# Patient Record
Sex: Female | Born: 1995 | Race: White | Hispanic: No | Marital: Single | Smoking: Former smoker
Health system: Southern US, Community
[De-identification: ages and names within clinical notes are randomized; demographics above are authoritative.]

## PROBLEM LIST (undated history)

## (undated) DIAGNOSIS — E079 Disorder of thyroid, unspecified: Secondary | ICD-10-CM

## (undated) DIAGNOSIS — R000 Tachycardia, unspecified: Secondary | ICD-10-CM

## (undated) DIAGNOSIS — R55 Syncope and collapse: Secondary | ICD-10-CM

## (undated) DIAGNOSIS — F419 Anxiety disorder, unspecified: Secondary | ICD-10-CM

## (undated) HISTORY — DX: Syncope and collapse: R55

## (undated) HISTORY — DX: Disorder of thyroid, unspecified: E07.9

## (undated) HISTORY — DX: Anxiety disorder, unspecified: F41.9

## (undated) HISTORY — DX: Tachycardia, unspecified: R00.0

---
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
## 2018-06-19 ENCOUNTER — Emergency Department (INDEPENDENT_AMBULATORY_CARE_PROVIDER_SITE_OTHER)
Admission: EM | Admit: 2018-06-19 | Discharge: 2018-06-19 | Disposition: A | Discharge status: Home / Self Care | Payer: No Typology Code available for payment source | Source: Home / Self Care | Attending: Internal Medicine | Admitting: Internal Medicine

## 2018-06-19 ENCOUNTER — Encounter: Payer: Self-pay | Admitting: Emergency Medicine

## 2018-06-19 DIAGNOSIS — R590 Localized enlarged lymph nodes: Secondary | ICD-10-CM | POA: Diagnosis not present

## 2018-06-19 NOTE — ED Provider Notes (Signed)
[facility]    CSN: [id number] Arrival date & time: 06/19/18  1106     History   Chief Complaint Chief Complaint  Patient presents with  . Lymphadenopathy    HPI Kristin Orozco is a 22 y.o. female.   22 year old female with no chronic medical problems presents to urgent care complaining of swollen lymph node under her right arm.  She states that has noticed it 4 days ago.  She denies fevers, weight loss or routine night sweats.  She also denies nausea, vomiting, diarrhea, chest pain, shortness of breath or breast pain or nipple discharge.     History reviewed. No pertinent past medical history.  Patient Active Problem List   Diagnosis Date Noted  . Loss of consciousness (HCC) 12/14/2013    History reviewed. No pertinent surgical history.  OB History   No obstetric history on file.      Home Medications    Prior to Admission medications   Medication Sig Start Date End Date Taking? Authorizing Provider  norethindrone-ethinyl estradiol (JUNEL FE,GILDESS FE,LOESTRIN FE) 1-20 MG-MCG tablet Take 1 tablet by mouth daily.   Yes [provider]  sulfamethoxazole-trimethoprim (BACTRIM) 400-80 MG per tablet Take 1 tablet by mouth 2 (two) times daily. 11/26/14   Renford Dills, NP  traMADol (ULTRAM) 50 MG tablet Take 1 tablet (50 mg total) by mouth every 8 (eight) hours as needed (Do not drive or operate machinery while taking as can cause drowsiness.). 11/26/14   Renford Dills, NP    Family History Family History  Problem Relation Age of Onset  . Hypertension Mother   . Lupus Maternal Uncle   . Multiple sclerosis Maternal Grandmother     Social History Social History   Tobacco Use  . Smoking status: Current Every Day Smoker    Packs/day: 0.50    Types: Cigarettes  . Smokeless tobacco: Never Used  Substance Use Topics  . Alcohol use: No  . Drug use: No     Allergies   Patient has no known allergies.   Review of Systems Review  of Systems  Constitutional: Negative for chills and fever.  HENT: Negative for sore throat and tinnitus.   Eyes: Negative for redness.  Respiratory: Negative for cough and shortness of breath.   Cardiovascular: Negative for chest pain and palpitations.  Gastrointestinal: Negative for abdominal pain, diarrhea, nausea and vomiting.  Genitourinary: Negative for dysuria, frequency and urgency.  Musculoskeletal: Negative for myalgias.  Skin: Negative for rash.       No lesions  Neurological: Negative for weakness.  Hematological: Does not bruise/bleed easily.  Psychiatric/Behavioral: Negative for suicidal ideas.     Physical Exam Triage Vital Signs ED Triage Vitals  Enc Vitals Group     BP 06/19/18 1127 (!) 144/83     Pulse Rate 06/19/18 1127 68     Resp --      Temp 06/19/18 1127 98 F (36.7 C)     Temp Source 06/19/18 1127 Oral     SpO2 06/19/18 1127 97 %     Weight 06/19/18 1128 150 lb (68 kg)     Height --      Head Circumference --      Peak Flow --      Pain Score 06/19/18 1128 0     Pain Loc --      Pain Edu? --      Excl. in GC? --    No data found.  Updated Vital Signs BP Marland Kitchen)  144/83 (BP Location: Right Arm)   Pulse 68   Temp 98 F (36.7 C) (Oral)   Wt 68 kg   SpO2 97%   BMI 24.96 kg/m   Visual Acuity Right Eye Distance:   Left Eye Distance:   Bilateral Distance:    Right Eye Near:   Left Eye Near:    Bilateral Near:     Physical Exam Vitals signs and nursing note reviewed.  Constitutional:      General: She is not in acute distress.    Appearance: She is well-developed.  HENT:     Head: Normocephalic and atraumatic.  Eyes:     General: No scleral icterus.    Conjunctiva/sclera: Conjunctivae normal.     Pupils: Pupils are equal, round, and reactive to light.  Neck:     Musculoskeletal: Normal range of motion and neck supple.     Thyroid: No thyromegaly.     Vascular: No JVD.     Trachea: No tracheal deviation.  Cardiovascular:     Rate and  Rhythm: Normal rate and regular rhythm.     Heart sounds: Normal heart sounds. No murmur. No friction rub. No gallop.   Pulmonary:     Effort: Pulmonary effort is normal.     Breath sounds: Normal breath sounds.  Chest:     Comments: Normal breast exam bilaterally Abdominal:     General: Bowel sounds are normal. There is no distension.     Palpations: Abdomen is soft.     Tenderness: There is no abdominal tenderness.  Musculoskeletal: Normal range of motion.  Lymphadenopathy:     Cervical: No cervical adenopathy.     Upper Body:     Right upper body: Axillary adenopathy (Tender) present.     Left upper body: Axillary adenopathy present.  Skin:    General: Skin is warm and dry.  Neurological:     Mental Status: She is alert and oriented to person, place, and time.     Cranial Nerves: No cranial nerve deficit.  Psychiatric:        Behavior: Behavior normal.        Thought Content: Thought content normal.        Judgment: Judgment normal.      UC Treatments / Results  Labs (all labs ordered are listed, but only abnormal results are displayed) Labs Reviewed - No data to display  EKG None  Radiology No results found.  Procedures Procedures (including critical care time)  Medications Ordered in UC Medications - No data to display  Initial Impression / Assessment and Plan / UC Course  I have reviewed the triage vital signs and the nursing notes.  Pertinent labs & imaging results that were available during my care of the patient were reviewed by me and considered in my medical decision making (see chart for details).     Patient has family history of breast cancer in her maternal grandmother.  Her mom is also been having some issues with her breast but largely due to breast implants that need to be removed.  The patient has not noticed any change to her breast tissue or nipple discharge.  She is on continuous birth control.  Breast exam is normal.  Discouraged routine  breast examination and less she has pain or skin changes or nipple discharge.  Final Clinical Impressions(s) / UC Diagnoses   Final diagnoses:  Axillary adenopathy   Discharge Instructions   None    ED Prescriptions  None     Controlled Substance Prescriptions [place] Controlled Substance Registry consulted? Not Applicable   Arnaldo Nataliamond, Kelcey Korus S, MD 06/19/18 [id number]

## 2018-06-19 NOTE — ED Triage Notes (Signed)
Pt c/o swollen lymph node on right axillary area since Friday. Denies pain.

## 2018-07-03 NOTE — Progress Notes (Signed)
Subjective:    Patient ID: [name], female    DOB: 1996-06-01, 23 y.o.   MRN: [id number]  HPI:  Kristin Orozco is here to establish as a new pt. She is a pleasant 23 year old female. PMH: Thyroid nodule dx'd 2014, last Thyroid US 2016 She reports fatigue, memory issues, and weight fluctuations since 2014 She reports migraine without aura She has been on Loestrin FE for years- denies SE She estimates to drink >64 ox water/day and consistently "eats well" She denies regular exercise She is trying to quit smoking, only using 3-6 cigarettes/day She uses ETOH socially She reports sig stress r/t to caring for her mother- [place] [employer] veteran with sig PTSD and service connected disability, however is unable to work or receive surgical intervention for musculoskeletal conditions due to heavy tobacco use and overall poor condition She is in stable 6 year relationship 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 Healthcare maintenance 07/06/2018  . Thyroid nodule 07/06/2018  . Stress 07/06/2018  . Loss of consciousness (HCC) 12/14/2013     Past Medical History:  Diagnosis Date  . Syncope      History reviewed. No pertinent surgical history.   Family History  Problem Relation Age of Onset  . Hypertension Mother   . Lupus Maternal Uncle   . Multiple sclerosis Maternal Grandmother   . Cancer Maternal Grandmother        breast and bone  . Diabetes Maternal Grandmother   . Heart attack Paternal Grandfather      Social History   Substance and Sexual Activity  Drug Use No     Social History   Substance and Sexual Activity  Alcohol Use Yes   Comment: rarely     Social History   Tobacco Use  Smoking Status Current Every Day Smoker  . Packs/day: 0.25  . Years:  6.00  . Pack years: 1.50  . Types: Cigarettes  Smokeless Tobacco Never Used     Outpatient Encounter Medications as of 07/06/2018  Medication Sig  . B Complex-C (SUPER B COMPLEX PO) Take 1 tablet by mouth daily.  . norethindrone-ethinyl estradiol (JUNEL FE,GILDESS FE,LOESTRIN FE) 1-20 MG-MCG tablet Take 1 tablet by mouth daily.  . [DISCONTINUED] sulfamethoxazole-trimethoprim (BACTRIM) 400-80 MG per tablet Take 1 tablet by mouth 2 (two) times daily.  . [DISCONTINUED] traMADol (ULTRAM) 50 MG tablet Take 1 tablet (50 mg total) by mouth every 8 (eight) hours as needed (Do not drive or operate machinery while taking as can cause drowsiness.).   No facility-administered encounter medications on file as of 07/06/2018.     Allergies: Patient has no known allergies.  Body mass index is 28.2 kg/m.  Blood pressure (!) 144/86, pulse 90, temperature 98.6 F (37 C), temperature source Oral, height 5' 2.25" (1.581 m), weight 155 lb 6.4 oz (70.5 kg), last menstrual period 07/02/2018, SpO2 97 %.  Review of Systems  Constitutional: Positive for fatigue. Negative for activity change, appetite change, chills, diaphoresis, fever and unexpected weight change.  Eyes: Negative for visual disturbance.  Respiratory: Negative for cough, chest tightness, shortness of breath, wheezing and stridor.   Cardiovascular: Negative for chest pain, palpitations and leg swelling.  Gastrointestinal: Negative for abdominal distention, abdominal pain, blood in stool, constipation,  diarrhea, nausea and vomiting.  Genitourinary: Negative for difficulty urinating and flank pain.  Musculoskeletal: Negative for arthralgias, back pain, gait problem, joint swelling, myalgias, neck pain and neck stiffness.  Skin: Negative for color change, pallor, rash and wound.  Neurological: Negative for dizziness and headaches.  Hematological: Does not bruise/bleed easily.  Psychiatric/Behavioral: Negative for agitation, behavioral problems,  confusion, decreased concentration, dysphoric mood, hallucinations, self-injury, sleep disturbance and suicidal ideas. The patient is not nervous/anxious and is not hyperactive.        Stress       Objective:   Physical Exam Vitals signs and nursing note reviewed.  Constitutional:      General: She is not in acute distress.    Appearance: She is not ill-appearing, toxic-appearing or diaphoretic.  HENT:     Head: Normocephalic and atraumatic.  Eyes:     Extraocular Movements: Extraocular movements intact.     Conjunctiva/sclera: Conjunctivae normal.     Pupils: Pupils are equal, round, and reactive to light.  Cardiovascular:     Rate and Rhythm: Normal rate.     Pulses: Normal pulses.     Heart sounds: No murmur.  Pulmonary:     Effort: Pulmonary effort is normal. No respiratory distress.     Breath sounds: Normal breath sounds. No stridor. No wheezing, rhonchi or rales.  Chest:     Chest wall: No tenderness.  Skin:    Capillary Refill: Capillary refill takes less than 2 seconds.  Neurological:     Mental Status: She is alert and oriented to person, place, and time.  Psychiatric:        Mood and Affect: Mood normal.        Behavior: Behavior normal.        Thought Content: Thought content normal.        Judgment: Judgment normal.       Assessment & Plan:   1. Healthcare maintenance   2. Stress   3. Thyroid nodule     Healthcare maintenance Continue all medications as directed, ask OB/GYN about changing oral birth control types. Continue to drink plenty of water and follow Mediterranean diet. Increase regular exercise. Referral to OB/GYN  And Psychology placed- once you have appt's at these practices, please call [employer] to secure referral claim number. Please schedule complete physical in 6 weeks, fasting labs the week prior.  Stress Increase regular exercise. Referral to Psychology placed    FOLLOW-UP:  Return in about 6 weeks (around 08/17/2018) for CPE,  Fasting Labs.

## 2018-07-06 ENCOUNTER — Encounter: Payer: Self-pay | Admitting: Adult Health

## 2018-07-06 ENCOUNTER — Ambulatory Visit (INDEPENDENT_AMBULATORY_CARE_PROVIDER_SITE_OTHER): Payer: No Typology Code available for payment source | Admitting: Adult Health

## 2018-07-06 VITALS — BP 144/86 | HR 90 | Temp 98.6°F | Ht 62.25 in | Wt 155.4 lb

## 2018-07-06 DIAGNOSIS — E041 Nontoxic single thyroid nodule: Secondary | ICD-10-CM | POA: Diagnosis not present

## 2018-07-06 DIAGNOSIS — Z0000 Encounter for general adult medical examination without abnormal findings: Secondary | ICD-10-CM | POA: Insufficient documentation

## 2018-07-06 DIAGNOSIS — F439 Reaction to severe stress, unspecified: Secondary | ICD-10-CM | POA: Insufficient documentation

## 2018-07-06 NOTE — Assessment & Plan Note (Signed)
Increase regular exercise. Referral to Psychology placed

## 2018-07-06 NOTE — Assessment & Plan Note (Signed)
Continue all medications as directed, ask OB/GYN about changing oral birth control types. Continue to drink plenty of water and follow Mediterranean diet. Increase regular exercise. Referral to OB/GYN  And Psychology placed- once you have appt's at these practices, please call [facility] to secure referral claim number. Please schedule complete physical in 6 weeks, fasting labs the week prior.

## 2018-07-06 NOTE — Patient Instructions (Addendum)
Mediterranean Diet A Mediterranean diet refers to food and lifestyle choices that are based on the traditions of countries located on the [employer]. This way of eating has been shown to help prevent certain conditions and improve outcomes for people who have chronic diseases, like kidney disease and heart disease. What are tips for following this plan? Lifestyle  Cook and eat meals together with your family, when possible.  Drink enough fluid to keep your urine clear or pale yellow.  Be physically active every day. This includes: ? Aerobic exercise like running or swimming. ? Leisure activities like gardening, walking, or housework.  Get 7-8 hours of sleep each night.  If recommended by your health care provider, drink red wine in moderation. This means 1 glass a day for nonpregnant women and 2 glasses a day for men. A glass of wine equals 5 oz (150 mL). Reading food labels   Check the serving size of packaged foods. For foods such as rice and pasta, the serving size refers to the amount of cooked product, not dry.  Check the total fat in packaged foods. Avoid foods that have saturated fat or trans fats.  Check the ingredients list for added sugars, such as corn syrup. Shopping  At the grocery store, buy most of your food from the areas near the walls of the store. This includes: ? Fresh fruits and vegetables (produce). ? Grains, beans, nuts, and seeds. Some of these may be available in unpackaged forms or large amounts (in bulk). ? Fresh seafood. ? Poultry and eggs. ? Low-fat dairy products.  Buy whole ingredients instead of prepackaged foods.  Buy fresh fruits and vegetables in-season from local farmers markets.  Buy frozen fruits and vegetables in resealable bags.  If you do not have access to quality fresh seafood, buy precooked frozen shrimp or canned fish, such as tuna, salmon, or sardines.  Buy small amounts of raw or cooked vegetables, salads, or olives from  the deli or salad bar at your store.  Stock your pantry so you always have certain foods on hand, such as olive oil, canned tuna, canned tomatoes, rice, pasta, and beans. Cooking  Cook foods with extra-virgin olive oil instead of using butter or other vegetable oils.  Have meat as a side dish, and have vegetables or grains as your main dish. This means having meat in small portions or adding small amounts of meat to foods like pasta or stew.  Use beans or vegetables instead of meat in common dishes like chili or lasagna.  Experiment with different cooking methods. Try roasting or broiling vegetables instead of steaming or sauteing them.  Add frozen vegetables to soups, stews, pasta, or rice.  Add nuts or seeds for added healthy fat at each meal. You can add these to yogurt, salads, or vegetable dishes.  Marinate fish or vegetables using olive oil, lemon juice, garlic, and fresh herbs. Meal planning   Plan to eat 1 vegetarian meal one day each week. Try to work up to 2 vegetarian meals, if possible.  Eat seafood 2 or more times a week.  Have healthy snacks readily available, such as: ? Vegetable sticks with hummus. ? [place] yogurt. ? Fruit and nut trail mix.  Eat balanced meals throughout the week. This includes: ? Fruit: 2-3 servings a day ? Vegetables: 4-5 servings a day ? Low-fat dairy: 2 servings a day ? Fish, poultry, or lean meat: 1 serving a day ? Beans and legumes: 2 or more servings a week ?  Nuts and seeds: 1-2 servings a day ? Whole grains: 6-8 servings a day ? Extra-virgin olive oil: 3-4 servings a day  Limit red meat and sweets to only a few servings a month What are my food choices?  Mediterranean diet ? Recommended ? Grains: Whole-grain pasta. Brown rice. Bulgar wheat. Polenta. Couscous. Whole-wheat bread. Orpah Cobb. ? Vegetables: Artichokes. Beets. Broccoli. Cabbage. Carrots. Eggplant. Green beans. Chard. Kale. Spinach. Onions. Leeks. Peas. Squash.  Tomatoes. Peppers. Radishes. ? Fruits: Apples. Apricots. Avocado. Berries. Bananas. Cherries. Dates. Figs. Grapes. Lemons. Melon. Oranges. Peaches. Plums. Pomegranate. ? Meats and other protein foods: Beans. Almonds. Sunflower seeds. Pine nuts. Peanuts. Cod. Salmon. Scallops. Shrimp. Tuna. Tilapia. Clams. Oysters. Eggs. ? Dairy: Low-fat milk. Cheese. Greek yogurt. ? Beverages: Water. Red wine. Herbal tea. ? Fats and oils: Extra virgin olive oil. Avocado oil. Grape seed oil. ? Sweets and desserts: [place] yogurt with honey. Baked apples. Poached pears. Trail mix. ? Seasoning and other foods: Basil. Cilantro. Coriander. Cumin. Mint. Parsley. Sage. Rosemary. Tarragon. Garlic. Oregano. Thyme. Pepper. Balsalmic vinegar. Tahini. Hummus. Tomato sauce. Olives. Mushrooms. ? Limit these ? Grains: Prepackaged pasta or rice dishes. Prepackaged cereal with added sugar. ? Vegetables: Deep fried potatoes (french fries). ? Fruits: Fruit canned in syrup. ? Meats and other protein foods: Beef. Pork. Lamb. Poultry with skin. Hot dogs. Tomasa Blase. ? Dairy: Ice cream. Sour cream. Whole milk. ? Beverages: Juice. Sugar-sweetened soft drinks. Beer. Liquor and spirits. ? Fats and oils: Butter. Canola oil. Vegetable oil. Beef fat (tallow). Lard. ? Sweets and desserts: Cookies. Cakes. Pies. Candy. ? Seasoning and other foods: Mayonnaise. Premade sauces and marinades. ? The items listed may not be a complete list. Talk with your dietitian about what dietary choices are right for you. Summary  The Mediterranean diet includes both food and lifestyle choices.  Eat a variety of fresh fruits and vegetables, beans, nuts, seeds, and whole grains.  Limit the amount of red meat and sweets that you eat.  Talk with your health care provider about whether it is safe for you to drink red wine in moderation. This means 1 glass a day for nonpregnant women and 2 glasses a day for men. A glass of wine equals 5 oz (150 mL). This information  is not intended to replace advice given to you by your health care provider. Make sure you discuss any questions you have with your health care provider. Document Released: 02/05/2016 Document Revised: 03/09/2016 Document Reviewed: 02/05/2016 Elsevier Interactive Patient Education  2019 [employer].  Continue all medications as directed, ask OB/GYN about changing oral birth control types. Continue to drink plenty of water and follow Mediterranean diet. Increase regular exercise. Referral to OB/GYN  And Psychology placed- once you have appt's at these practices, please call [employer] to secure referral claim number. Please schedule complete physical in 6 weeks, fasting labs the week prior. WELCOME TO THE PRACTICE!

## 2018-07-07 ENCOUNTER — Encounter: Payer: Self-pay | Admitting: Obstetrics and Gynecology

## 2018-07-28 ENCOUNTER — Ambulatory Visit: Payer: No Typology Code available for payment source | Admitting: Psychology

## 2018-07-28 DIAGNOSIS — F411 Generalized anxiety disorder: Secondary | ICD-10-CM

## 2018-08-04 ENCOUNTER — Encounter: Payer: Self-pay | Admitting: Physician Assistant

## 2018-08-04 ENCOUNTER — Ambulatory Visit (INDEPENDENT_AMBULATORY_CARE_PROVIDER_SITE_OTHER): Payer: Self-pay | Admitting: Physician Assistant

## 2018-08-04 VITALS — BP 128/80 | HR 79 | Temp 98.4°F | Resp 16 | Ht 63.0 in | Wt 155.0 lb

## 2018-08-04 DIAGNOSIS — H6982 Other specified disorders of Eustachian tube, left ear: Secondary | ICD-10-CM

## 2018-08-04 DIAGNOSIS — T148XXA Other injury of unspecified body region, initial encounter: Secondary | ICD-10-CM

## 2018-08-04 DIAGNOSIS — R0981 Nasal congestion: Secondary | ICD-10-CM

## 2018-08-04 DIAGNOSIS — J069 Acute upper respiratory infection, unspecified: Secondary | ICD-10-CM

## 2018-08-04 LAB — POCT INFLUENZA A/B
Influenza A, POC: NEGATIVE
Influenza B, POC: NEGATIVE

## 2018-08-04 MED ORDER — FLUTICASONE PROPIONATE 50 MCG/ACT NA SUSP: 2.0000 | Freq: Every day | NASAL | 0 refills | Status: DC

## 2018-08-04 MED ORDER — NAPROXEN 500 MG PO TABS: 500.0000 mg | ORAL_TABLET | Freq: Two times a day (BID) | ORAL | 0 refills | Status: DC

## 2018-08-04 MED ORDER — PSEUDOEPHEDRINE HCL 60 MG PO TABS: 60.0000 mg | ORAL_TABLET | Freq: Three times a day (TID) | ORAL | 0 refills | Status: DC | PRN

## 2018-08-04 MED ORDER — CETIRIZINE HCL 10 MG PO TABS: 10.0000 mg | ORAL_TABLET | Freq: Every day | ORAL | 0 refills | Status: DC

## 2018-08-04 MED ORDER — CYCLOBENZAPRINE HCL 5 MG PO TABS: 5.0000 mg | ORAL_TABLET | Freq: Three times a day (TID) | ORAL | 0 refills | Status: DC | PRN

## 2018-08-04 NOTE — Patient Instructions (Signed)
For nasal congestion and ear pain:   Start daily flonase, zyrtec, and sudafed- this should provide you with relief if you use as prescribed for the next 3-5 days. If you symptoms do not improve in 5 days, please follow up. If anything worsens or you develop new concerning symptoms, please seek care at urgent care.    Eustachian Tube Dysfunction  Eustachian tube dysfunction refers to a condition in which a blockage develops in the narrow passage that connects the middle ear to the back of the nose (eustachian tube). The eustachian tube regulates air pressure in the middle ear by letting air move between the ear and nose. It also helps to drain fluid from the middle ear space. Eustachian tube dysfunction can affect one or both ears. When the eustachian tube does not function properly, air pressure, fluid, or both can build up in the middle ear. What are the causes? This condition occurs when the eustachian tube becomes blocked or cannot open normally. Common causes of this condition include:  Ear infections.  Colds and other infections that affect the nose, mouth, and throat (upper respiratory tract).  Allergies.  Irritation from cigarette smoke.  Irritation from stomach acid coming up into the esophagus (gastroesophageal reflux). The esophagus is the tube that carries food from the mouth to the stomach.  Sudden changes in air pressure, such as from descending in an airplane or scuba diving.  Abnormal growths in the nose or throat, such as: ? Growths that line the nose (nasal polyps). ? Abnormal growth of cells (tumors). ? Enlarged tissue at the back of the throat (adenoids). What increases the risk? You are more likely to develop this condition if:  You smoke.  You are overweight.  You are a child who has: ? Certain birth defects of the mouth, such as cleft palate. ? Large tonsils or adenoids. What are the signs or symptoms? Common symptoms of this condition include:  A feeling  of fullness in the ear.  Ear pain.  Clicking or popping noises in the ear.  Ringing in the ear.  Hearing loss.  Loss of balance.  Dizziness. Symptoms may get worse when the air pressure around you changes, such as when you travel to an area of high elevation, fly on an airplane, or go scuba diving. How is this diagnosed? This condition may be diagnosed based on:  Your symptoms.  A physical exam of your ears, nose, and throat.  Tests, such as those that measure: ? The movement of your eardrum (tympanogram). ? Your hearing (audiometry). How is this treated? Treatment depends on the cause and severity of your condition.  In mild cases, you may relieve your symptoms by moving air into your ears. This is called "popping the ears."  In more severe cases, or if you have symptoms of fluid in your ears, treatment may include: ? Medicines to relieve congestion (decongestants). ? Medicines that treat allergies (antihistamines). ? Nasal sprays or ear drops that contain medicines that reduce swelling (steroids). ? A procedure to drain the fluid in your eardrum (myringotomy). In this procedure, a small tube is placed in the eardrum to:  Drain the fluid.  Restore the air in the middle ear space. ? A procedure to insert a balloon device through the nose to inflate the opening of the eustachian tube (balloon dilation). Follow these instructions at home: Lifestyle  Do not do any of the following until your health care provider approves: ? Travel to high altitudes. ? Fly in  airplanes. ? Work in a Estate agent or room. ? Scuba dive.  Do not use any products that contain nicotine or tobacco, such as cigarettes and e-cigarettes. If you need help quitting, ask your health care provider.  Keep your ears dry. Wear fitted earplugs during showering and bathing. Dry your ears completely after. General instructions  Take over-the-counter and prescription medicines only as told by your  health care provider.  Use techniques to help pop your ears as recommended by your health care provider. These may include: ? Chewing gum. ? Yawning. ? Frequent, forceful swallowing. ? Closing your mouth, holding your nose closed, and gently blowing as if you are trying to blow air out of your nose.  Keep all follow-up visits as told by your health care provider. This is important. Contact a health care provider if:  Your symptoms do not go away after treatment.  Your symptoms come back after treatment.  You are unable to pop your ears.  You have: ? A fever. ? Pain in your ear. ? Pain in your head or neck. ? Fluid draining from your ear.  Your hearing suddenly changes.  You become very dizzy.  You lose your balance. Summary  Eustachian tube dysfunction refers to a condition in which a blockage develops in the eustachian tube.  It can be caused by ear infections, allergies, inhaled irritants, or abnormal growths in the nose or throat.  Symptoms include ear pain, hearing loss, or ringing in the ears.  Mild cases are treated with maneuvers to unblock the ears, such as yawning or ear popping.  Severe cases are treated with medicines. Surgery may also be done (rare). This information is not intended to replace advice given to you by your health care provider. Make sure you discuss any questions you have with your health care provider. Document Released: 07/11/2015 Document Revised: 10/04/2017 Document Reviewed: 10/04/2017 [employer] Interactive Patient Education  2019 [employer].   For back pain:   I recommend resting today. However, tomorrow I would begin walking and moving around as much as tolerated. Begin stretching in a couple of days. The worse thing you can do for low back pain is lie in bed all day or sit down all day. Use medications as needed.   Just to know, muscle relaxants like flexeril can cause side effects that may impair your thinking or reactions. Be  careful if you drive or do anything that requires you to be awake and alert. void drinking alcohol, which can increase some of the side effects of Flexeril.  NSAIDs like naproxen have common side effects of heartburn, stomach pain, indigestion, and headache. Could lead to renal insufficiency, stroke, or GI bleed if taken excess amounts outside of what is recommended on label long term.    You should avoid heavy lifting or strenuous repetitive activity to prevent recurrence of event. Use heat pad or ice pack, do not apply directly to skin, use barrier such as towel over the skin. Leave on for 15-20 minutes, 3-4 times a day.  Please perform exercises below. Stretches are to be performed for 2 sets, holding 10-15 seconds each. Recommended to perform this rehab twice daily within pain tolerance for 2 weeks.  If no improvement after 1-2 weeks, please follow up with your PCP. If any symptoms worsen or you develop new concerning symptoms, please seek care immediately at local urgent care/ED.    FLEXION RANGE OF MOTION AND STRETCHING EXERCISES: STRETCH - Flexion, Single Knee to Chest  Lie on a firm bed or floor with both legs extended in front of you.  Keeping one leg in contact with the floor, bring your opposite knee to your chest. Hold your leg in place by either grabbing behind your thigh or at your knee.  Pull until you feel a gentle stretch in your lower back.   Slowly release your grasp and repeat the exercise with the opposite side.  STRETCH - Flexion, Double Knee to Chest   Lie on a firm bed or floor with both legs extended in front of you.  Keeping one leg in contact with the floor, bring your opposite knee to your chest.  Tense your stomach muscles to support your back and then lift your other knee to your chest. Hold your legs in place by either grabbing behind your thighs or at your knees.  Pull both knees toward your chest until you feel a gentle stretch in your lower back.    Tense your stomach muscles and slowly return one leg at a time to the floor.  STRETCH - Low Trunk Rotation  Lie on a firm bed or floor. Keeping your legs in front of you, bend your knees so they are both pointed toward the ceiling and your feet are flat on the floor.  Extend your arms out to the side. This will stabilize your upper body by keeping your shoulders in contact with the floor.  Gently and slowly drop both knees together to one side until you feel a gentle stretch in your lower back.   Tense your stomach muscles to support your lower back as you bring your knees back to the starting position. Repeat the exercise to the other side.   EXTENSION RANGE OF MOTION AND FLEXIBILITY EXERCISES: STRETCH - Extension, Prone on Elbows   Lie on your stomach on the floor, a bed will be too soft. Place your palms about shoulder width apart and at the height of your head.  Place your elbows under your shoulders. If this is too painful, stack pillows under your chest.  Allow your body to relax so that your hips drop lower and make contact more completely with the floor.  Slowly return to lying flat on the floor.  RANGE OF MOTION - Extension, Prone Press Ups  Lie on your stomach on the floor, a bed will be too soft. Place your palms about shoulder width apart and at the height of your head.  Keeping your back as relaxed as possible, slowly straighten your elbows while keeping your hips on the floor. You may adjust the placement of your hands to maximize your comfort. As you gain motion, your hands will come more underneath your shoulders.  Slowly return to lying flat on the floor.  RANGE OF MOTION- Quadruped, Neutral Spine   Assume a hands and knees position on a firm surface. Keep your hands under your shoulders and your knees under your hips. You may place padding under your knees for comfort.  Drop your head and point your tail bone toward the ground below you. This will round out  your lower back like an angry cat.    Slowly lift your head and release your tail bone so that your back sags into a large arch, like an old horse.  Repeat this until you feel limber in your lower back.  Now, find your "sweet spot." This will be the most comfortable position somewhere between the two previous positions. This is your neutral spine. Once you  have found this position, tense your stomach muscles to support your lower back.  STRENGTHENING EXERCISES - Low Back Strain These exercises may help you when beginning to rehabilitate your injury. These exercises should be done near your "sweet spot." This is the neutral, low-back arch, somewhere between fully rounded and fully arched, that is your least painful position. When performed in this safe range of motion, these exercises can be used for people who have either a flexion or extension based injury. These exercises may resolve your symptoms with or without further involvement from your physician, physical therapist or athletic trainer. While completing these exercises, remember:   Muscles can gain both the endurance and the strength needed for everyday activities through controlled exercises.  Complete these exercises as instructed by your physician, physical therapist or athletic trainer. Increase the resistance and repetitions only as guided.  You may experience muscle soreness or fatigue, but the pain or discomfort you are trying to eliminate should never worsen during these exercises. If this pain does worsen, stop and make certain you are following the directions exactly. If the pain is still present after adjustments, discontinue the exercise until you can discuss the trouble with your caregiver.  STRENGTHENING - Deep Abdominals, Pelvic Tilt  Lie on a firm bed or floor. Keeping your legs in front of you, bend your knees so they are both pointed toward the ceiling and your feet are flat on the floor.  Tense your lower abdominal  muscles to press your lower back into the floor. This motion will rotate your pelvis so that your tail bone is scooping upwards rather than pointing at your feet or into the floor.  STRENGTHENING - Abdominals, Crunches   Lie on a firm bed or floor. Keeping your legs in front of you, bend your knees so they are both pointed toward the ceiling and your feet are flat on the floor. Cross your arms over your chest.  Slightly tip your chin down without bending your neck.  Tense your abdominals and slowly lift your trunk high enough to just clear your shoulder blades. Lifting higher can put excessive stress on the lower back and does not further strengthen your abdominal muscles.  Control your return to the starting position.  STRENGTHENING - Quadruped, Opposite UE/LE Lift   Assume a hands and knees position on a firm surface. Keep your hands under your shoulders and your knees under your hips. You may place padding under your knees for comfort.  Find your neutral spine and gently tense your abdominal muscles so that you can maintain this position. Your shoulders and hips should form a rectangle that is parallel with the floor and is not twisted.  Keeping your trunk steady, lift your right hand no higher than your shoulder and then your left leg no higher than your hip. Make sure you are not holding your breath.   Continuing to keep your abdominal muscles tense and your back steady, slowly return to your starting position. Repeat with the opposite arm and leg.  STRENGTHENING - Lower Abdominals, Double Knee Lift  Lie on a firm bed or floor. Keeping your legs in front of you, bend your knees so they are both pointed toward the ceiling and your feet are flat on the floor.  Tense your abdominal muscles to brace your lower back and slowly lift both of your knees until they come over your hips. Be certain not to hold your breath.  POSTURE AND BODY MECHANICS CONSIDERATIONS - Low Back  Strain Keeping  correct posture when sitting, standing or completing your activities will reduce the stress put on different body tissues, allowing injured tissues a chance to heal and limiting painful experiences. The following are general guidelines for improved posture. Your physician or physical therapist will provide you with any instructions specific to your needs. While reading these guidelines, remember:  The exercises prescribed by your provider will help you have the flexibility and strength to maintain correct postures.  The correct posture provides the best environment for your joints to work. All of your joints have less wear and tear when properly supported by a spine with good posture. This means you will experience a healthier, less painful body.  Correct posture must be practiced with all of your activities, especially prolonged sitting and standing. Correct posture is as important when doing repetitive low-stress activities (typing) as it is when doing a single heavy-load activity (lifting). RESTING POSITIONS Consider which positions are most painful for you when choosing a resting position. If you have pain with flexion-based activities (sitting, bending, stooping, squatting), choose a position that allows you to rest in a less flexed posture. You would want to avoid curling into a fetal position on your side. If your pain worsens with extension-based activities (prolonged standing, working overhead), avoid resting in an extended position such as sleeping on your stomach. Most people will find more comfort when they rest with their spine in a more neutral position, neither too rounded nor too arched. Lying on a non-sagging bed on your side with a pillow between your knees, or on your back with a pillow under your knees will often provide some relief. Keep in mind, being in any one position for a prolonged period of time, no matter how correct your posture, can still lead to stiffness. PROPER SITTING  POSTURE In order to minimize stress and discomfort on your spine, you must sit with correct posture. Sitting with good posture should be effortless for a healthy body. Returning to good posture is a gradual process. Many people can work toward this most comfortably by using various supports until they have the flexibility and strength to maintain this posture on their own. When sitting with proper posture, your ears will fall over your shoulders and your shoulders will fall over your hips. You should use the back of the chair to support your upper back. Your lower back will be in a neutral position, just slightly arched. You may place a small pillow or folded towel at the base of your lower back for support.  When working at a desk, create an environment that supports good, upright posture. Without extra support, muscles tire, which leads to excessive strain on joints and other tissues. Keep these recommendations in mind: CHAIR:  A chair should be able to slide under your desk when your back makes contact with the back of the chair. This allows you to work closely.  The chair's height should allow your eyes to be level with the upper part of your monitor and your hands to be slightly lower than your elbows. BODY POSITION  Your feet should make contact with the floor. If this is not possible, use a foot rest.  Keep your ears over your shoulders. This will reduce stress on your neck and lower back. INCORRECT SITTING POSTURES  If you are feeling tired and unable to assume a healthy sitting posture, do not slouch or slump. This puts excessive strain on your back tissues, causing more damage and pain.  Healthier options include:  Using more support, like a lumbar pillow.  Switching tasks to something that requires you to be upright or walking.  Talking a brief walk.  Lying down to rest in a neutral-spine position. PROLONGED STANDING WHILE SLIGHTLY LEANING FORWARD  When completing a task that  requires you to lean forward while standing in one place for a long time, place either foot up on a stationary 2-4 inch high object to help maintain the best posture. When both feet are on the ground, the lower back tends to lose its slight inward curve. If this curve flattens (or becomes too large), then the back and your other joints will experience too much stress, tire more quickly, and can cause pain. CORRECT STANDING POSTURES Proper standing posture should be assumed with all daily activities, even if they only take a few moments, like when brushing your teeth. As in sitting, your ears should fall over your shoulders and your shoulders should fall over your hips. You should keep a slight tension in your abdominal muscles to brace your spine. Your tailbone should point down to the ground, not behind your body, resulting in an over-extended swayback posture.  INCORRECT STANDING POSTURES  Common incorrect standing postures include a forward head, locked knees and/or an excessive swayback. WALKING Walk with an upright posture. Your ears, shoulders and hips should all line-up. PROLONGED ACTIVITY IN A FLEXED POSITION When completing a task that requires you to bend forward at your waist or lean over a low surface, try to find a way to stabilize 3 out of 4 of your limbs. You can place a hand or elbow on your thigh or rest a knee on the surface you are reaching across. This will provide you more stability so that your muscles do not fatigue as quickly. By keeping your knees relaxed, or slightly bent, you will also reduce stress across your lower back. CORRECT LIFTING TECHNIQUES DO :   Assume a wide stance. This will provide you more stability and the opportunity to get as close as possible to the object which you are lifting.  Tense your abdominals to brace your spine. Bend at the knees and hips. Keeping your back locked in a neutral-spine position, lift using your leg muscles. Lift with your legs,  keeping your back straight.  Test the weight of unknown objects before attempting to lift them.  Try to keep your elbows locked down at your sides in order get the best strength from your shoulders when carrying an object.  Always ask for help when lifting heavy or awkward objects. INCORRECT LIFTING TECHNIQUES DO NOT:   Lock your knees when lifting, even if it is a small object.  Bend and twist. Pivot at your feet or move your feet when needing to change directions.  Assume that you can safely pick up even a paper clip without proper posture.

## 2018-08-04 NOTE — Progress Notes (Signed)
MRN: [id number] DOB: [birth year]  Subjective:   Kristin Orozco is a 23 y.o. female presenting for chief complaint of 2 complaints- ear pain/nasal congestion and back pain.  Reports left ear pain and left sided nasal congestion x 1 day. It has improved since last night but still having trouble breathing out of left nostril. Has associated sneezing, runny nose, and scratchy throat. Denies decreased hearing, tinnitus, dizziness, ear drainage, sinus pain, inability to swallow, dry cough, productive cough, wheezing, shortness of breath and chest tightness. Has not tried anything for relief.  No known sick contact exposure. Denies history of seasonal allergies, asthma, DM, and HTN. Patient has had flu shot this season. Denies smoking.   Also having muscle pain in back x 1 day. Got home yesterday and started moving furniture. Ended up moving a 400lb couch. Noticed the pain in the right side of her back later that night. Denies saddle anesthesia, bladder/bowel incontinence, numbness, tingling, weakness, rash, redness, warmth, fever, chills, N/V/D, abdominal pain, hematuria, urinary frequency, urgency, and dysuria. No prior back injuries.   ROS  Per HPI  Zaiyah has a current medication list which includes the following prescription(s): denta 5000 plus, norethindrone-ethinyl estradiol, cetirizine, cyclobenzaprine, fluticasone, naproxen, and pseudoephedrine. Also has No Known Allergies.  Antanique  has a past medical history of Syncope. Also  has no past surgical history on file.   Objective:   Vitals: BP 128/80 (BP Location: Right Arm, Patient Position: Sitting, Cuff Size: Normal)   Pulse 79   Temp 98.4 F (36.9 C)   Resp 16   Ht 5\' 3"  (1.6 m)   Wt 155 lb (70.3 kg)   LMP 07/17/2018   SpO2 99%   BMI 27.46 kg/m   Physical Exam Vitals signs reviewed.  Constitutional:      General: She is not in acute distress.    Appearance: She is well-developed. She is not ill-appearing or toxic-appearing.    HENT:     Head: Normocephalic and atraumatic.     Right Ear: Tympanic membrane, ear canal and external ear normal. No mastoid tenderness.     Left Ear: Ear canal and external ear normal. A middle ear effusion is present. No mastoid tenderness. Tympanic membrane is not erythematous or bulging.     Nose: Mucosal edema (L>R, decreased nasal patancy on left side) and congestion present.     Right Sinus: No maxillary sinus tenderness or frontal sinus tenderness.     Left Sinus: No maxillary sinus tenderness or frontal sinus tenderness.     Mouth/Throat:     Lips: Pink.     Mouth: Mucous membranes are moist.     Pharynx: Uvula midline. Posterior oropharyngeal erythema present.     Tonsils: No tonsillar exudate or tonsillar abscesses. Swelling: 1+ on the right. 1+ on the left.  Eyes:     Conjunctiva/sclera: Conjunctivae normal.  Neck:     Musculoskeletal: Normal range of motion.  Cardiovascular:     Rate and Rhythm: Normal rate and regular rhythm.     Heart sounds: Normal heart sounds.  Pulmonary:     Effort: Pulmonary effort is normal.     Breath sounds: Normal breath sounds. No decreased breath sounds, wheezing, rhonchi or rales.  Abdominal:     Tenderness: There is no right CVA tenderness or left CVA tenderness.  Musculoskeletal:     Cervical back: She exhibits normal range of motion and no bony tenderness.     Thoracic back: She exhibits tenderness. She exhibits normal range  of motion, no bony tenderness and no swelling.     Lumbar back: Normal. She exhibits normal range of motion, no tenderness and no bony tenderness.       Back:  Lymphadenopathy:     Head:     Right side of head: No submental, submandibular, tonsillar, preauricular, posterior auricular or occipital adenopathy.     Left side of head: No submental, submandibular, tonsillar, preauricular, posterior auricular or occipital adenopathy.     Cervical: No cervical adenopathy.     Upper Body:     Right upper body: No  supraclavicular adenopathy.     Left upper body: No supraclavicular adenopathy.  Skin:    General: Skin is warm and dry.  Neurological:     Mental Status: She is alert.     Deep Tendon Reflexes: Reflexes are normal and symmetric.     Comments: Strength of BLE 5/5. Sensation of BLE intact.      Results for orders placed or performed in visit on 08/04/18 (from the past 24 hour(s))  POCT Influenza A/B     Status: Normal   Collection Time: 08/04/18  9:46 AM  Result Value Ref Range   Influenza A, POC Negative Negative   Influenza B, POC Negative Negative    Assessment and Plan :  1. Viral URI 2. Dysfunction of left eustachian tube Pt is overall well appearing, NAD. VSS. Hx and PE findings consistent with ETD secondary to viral URI. No erythema or bulging of TM to suggest acute bacterial etiology at this time.  Pt was concerned about flu, so we obtained a rapid flu test-which was negative. Pt was reassured. Rec flonase, zyrtec, and sudafed. Encouraged to f/u if no improvement in 5 days. Seek care sooner if sx worsen/develop new concerning sx. - fluticasone (FLONASE) 50 MCG/ACT nasal spray; Place 2 sprays into both nostrils daily.  Dispense: 16 g; Refill: 0 - pseudoephedrine (SUDAFED) 60 MG tablet; Take 1 tablet (60 mg total) by mouth every 8 (eight) hours as needed for congestion.  Dispense: 20 tablet; Refill: 0 - cetirizine (ZYRTEC) 10 MG tablet; Take 1 tablet (10 mg total) by mouth daily.  Dispense: 30 tablet; Refill: 0  3. Muscle strain Hx and PE findings consistent with low back strain and spasm. No acute findings on neuro exam. No midline tenderness. Recommend conservative tx with rest, heating pad, NSAIDs, muscle relaxants, and stretching as tolerated. Given education material for low back stretches. Avoid heavy lifting until pain improves. Given educational material regarding proper lifting. Advised to return to clinic if symptoms worsen, do not improve, or as needed.  - naproxen  (NAPROSYN) 500 MG tablet; Take 1 tablet (500 mg total) by mouth 2 (two) times daily with a meal.  Dispense: 20 tablet; Refill: 0 - cyclobenzaprine (FLEXERIL) 5 MG tablet; Take 1 tablet (5 mg total) by mouth 3 (three) times daily as needed for muscle spasms.  Dispense: 15 tablet; Refill: 0  4. Nasal congestion - POCT Influenza A/B    Benjiman Core, PA-C  [facility] 08/04/2018 10:02 AM

## 2018-08-08 ENCOUNTER — Telehealth: Payer: Self-pay | Admitting: Emergency Medicine

## 2018-08-08 NOTE — Telephone Encounter (Signed)
Spoke with patient feeling so much better she stated. This was a follow up call from [facility].

## 2018-08-15 ENCOUNTER — Telehealth: Payer: No Typology Code available for payment source | Admitting: Physician Assistant

## 2018-08-15 DIAGNOSIS — K529 Noninfective gastroenteritis and colitis, unspecified: Secondary | ICD-10-CM

## 2018-08-15 MED ORDER — ONDANSETRON HCL 4 MG PO TABS: 4.0000 mg | ORAL_TABLET | Freq: Three times a day (TID) | ORAL | 0 refills | Status: DC | PRN

## 2018-08-15 NOTE — Progress Notes (Signed)
We are sorry that you are not feeling well.  Here is how we plan to help!  Based on what you have shared with me it looks like you have Acute Infectious Diarrhea.  Most cases of acute diarrhea are due to infections with virus and bacteria and are self-limited conditions lasting less than 14 days.  For your symptoms you may take Imodium 2 mg tablets that are over the counter at your local pharmacy. Take two tablet now and then one after each loose stool up to 6 a day.  Antibiotics are not needed for most people with diarrhea.  Optional: Zofran 4 mg 1 tablet every 8 hours as needed for nausea and vomiting    HOME CARE We recommend changing your diet to help with your symptoms for the next few days. Drink plenty of fluids that contain water salt and sugar. Sports drinks such as Gatorade may help.  You may try broths, soups, bananas, applesauce, soft breads, mashed potatoes or crackers.  You are considered infectious for as long as the diarrhea continues. Hand washing or use of alcohol based hand sanitizers is recommend. It is best to stay out of work or school until your symptoms stop.   GET HELP RIGHT AWAY If you have dark yellow colored urine or do not pass urine frequently you should drink more fluids.   If your symptoms worsen  If you feel like you are going to pass out (faint) You have a new problem  MAKE SURE YOU  Understand these instructions. Will watch your condition. Will get help right away if you are not doing well or get worse.  Your e-visit answers were reviewed by a board certified advanced clinical practitioner to complete your personal care plan.  Depending on the condition, your plan could have included both over the counter or prescription medications.  If there is a problem please reply once you have received a response from your provider.  Your safety is important to [place].  If you have drug allergies check your prescription carefully.    You can use MyChart to  ask questions about today's visit, request a non-urgent call back, or ask for a work or school excuse for 24 hours related to this e-Visit. If it has been greater than 24 hours you will need to follow up with your provider, or enter a new e-Visit to address those concerns.   You will get an e-mail in the next two days asking about your experience.  I hope that your e-visit has been valuable and will speed your recovery. Thank you for using e-visits.   ===View-only below this line===   ----- Message -----    From: Ardith Dark    Sent: 08/15/2018  8:17 AM EST      To: E-Visit Mailing List Subject: E-Visit Submission: Diarrhea  E-Visit Submission: Diarrhea --------------------------------  Question: When did the diarrhea begin? Answer:   Yesterday  Question: How many stools have you passed in the last 24 hours? Answer:   I can't remember  Question: Do you have a fever? Answer:   No, I do not have a fever  Question: Is there blood in your stool, or is your stool dark red or black? Answer:   None of the above  Question: Does your stool contain pus or mucus? Answer:   No, my stool does not contain pus or mucus  Question: Are you vomiting? Answer:   Yes, I am vomiting occassionally  Question: Are you able to  keep down fluids? Answer:   Yes, I can keep down some fluids  Question: Do you have belly pain? Answer:   I have little or no pain  Question: Are you feeling dizzy or like you might pass out? Answer:   Yes  Question: Are you having trouble walking or lifting yourself due to weakness from this illness? Answer:   Yes  Question: Do any of the following apply to you? Answer:   My urine is normal  Question: Did the diarrhea begin after a specific meal that may have caused the illness? Answer:   Not clearly related to a meal  Question: Have you taken antibiotics recently? Answer:   I have not been on any antibiotics  Question: Have you been hospitalized in the  past 2 months? Answer:   No, I have not been hospitalized recently  Question: Have you taken a laxative or a medicine to help you move your bowels lately? 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 Answer:   No  Question: Have you had a meal consisting of raw meat or fish in the week prior to your illness? Answer:   No  Question: Have you recently travelled to a place where you may have caught an illness? Answer:   No  Question: Have you tried any medication or other treatment for your symptoms? Answer:   No  Question: Please list your medication allergies that you may have ? (If 'none' , please list as 'none') Answer:   None  Question: Are you pregnant? Answer:   I am confident that I am not pregnant  Question: Are you breastfeeding? Answer:   No  Question: Please list any additional comments  Answer:

## 2018-08-18 ENCOUNTER — Other Ambulatory Visit (HOSPITAL_COMMUNITY)
Admission: RE | Admit: 2018-08-18 | Discharge: 2018-08-18 | Disposition: A | Discharge status: Home / Self Care | Payer: No Typology Code available for payment source | Source: Ambulatory Visit | Attending: Obstetrics and Gynecology | Admitting: Obstetrics and Gynecology

## 2018-08-18 ENCOUNTER — Other Ambulatory Visit: Payer: Self-pay

## 2018-08-18 ENCOUNTER — Ambulatory Visit (INDEPENDENT_AMBULATORY_CARE_PROVIDER_SITE_OTHER): Payer: No Typology Code available for payment source | Admitting: Obstetrics and Gynecology

## 2018-08-18 ENCOUNTER — Encounter: Payer: Self-pay | Admitting: Obstetrics and Gynecology

## 2018-08-18 VITALS — BP 144/88 | HR 88 | Resp 22 | Ht 63.0 in | Wt 155.0 lb

## 2018-08-18 DIAGNOSIS — Z01419 Encounter for gynecological examination (general) (routine) without abnormal findings: Secondary | ICD-10-CM | POA: Insufficient documentation

## 2018-08-18 DIAGNOSIS — Z113 Encounter for screening for infections with a predominantly sexual mode of transmission: Secondary | ICD-10-CM | POA: Diagnosis not present

## 2018-08-18 DIAGNOSIS — Z3009 Encounter for other general counseling and advice on contraception: Secondary | ICD-10-CM

## 2018-08-18 NOTE — Progress Notes (Signed)
23 y.o. G0P0000 Single Caucasian female here for annual exam.    Patient states she has irregular cycles even with OCPs. Can miss one or two pills.  Has had a lot of problems with pills - nausea and headaches.  Having nipple cracking and burning and tenderness. Used a friends's nipple cream.  Has decreased sex drive.  Gained weight on Depo Provera and felt crazy.   She complains of dyspareunia--has difficulty using tampons.  Steady relationship for 6 years.  They live together.   Has anxiety.  Is in therapy.  Mother is a Administrator, Civil Service and is working on disability.  Patient raised her brother while her mother was in [place].  PCP:   William Hamburger, NP  Patient's last menstrual period was 07/29/2018 (approximate).     Period Pattern: (!) Irregular Menstrual Flow: (different each month) Menstrual Control: Thin pad Dysmenorrhea: (!) Moderate Dysmenorrhea Symptoms: Headache, Nausea, Cramping     Sexually active: Yes.   female The current method of family planning is OCP (estrogen/progesterone).    Exercising: No.  The patient does not participate in regular exercise at present. Smoker:  Yes, smokes 3 cigarettes/day  Health Maintenance: Pap:  Never History of abnormal Pap:  n/a MMG:  n/a Colonoscopy:  n/a BMD:   n/a  Result  n/a TDaP:  Up to date Gardasil:   No.    HIV: Unsure Hep C: Unsure Screening Labs:   ---   reports that she has been smoking cigarettes. She has a 1.50 pack-year smoking history. She has never used smokeless tobacco. She reports current alcohol use of about 3.0 standard drinks of alcohol per week. She reports that she does not use drugs.  Past Medical History:  Diagnosis Date  . Anxiety   . Syncope   . Tachycardia   . Thyroid disease    Hx of thyroid nodule--followed by PCP    History reviewed. No pertinent surgical history.  Current Outpatient Medications  Medication Sig Dispense Refill  . cetirizine (ZYRTEC) 10 MG tablet Take 1 tablet (10 mg total)  by mouth daily. 30 tablet 0  . cyclobenzaprine (FLEXERIL) 5 MG tablet Take 1 tablet (5 mg total) by mouth 3 (three) times daily as needed for muscle spasms. 15 tablet 0  . DENTA 5000 PLUS 1.1 % CREA dental cream USE ONCE OR TWICE PER DAY. AT LEAST ONCE BEFORE BEDTIME    . fluticasone (FLONASE) 50 MCG/ACT nasal spray Place 2 sprays into both nostrils daily. 16 g 0  . naproxen (NAPROSYN) 500 MG tablet Take 1 tablet (500 mg total) by mouth 2 (two) times daily with a meal. 20 tablet 0  . norethindrone-ethinyl estradiol (JUNEL FE,GILDESS FE,LOESTRIN FE) 1-20 MG-MCG tablet Take 1 tablet by mouth daily.    . ondansetron (ZOFRAN) 4 MG tablet Take 1 tablet (4 mg total) by mouth every 8 (eight) hours as needed for nausea or vomiting. 20 tablet 0  . pseudoephedrine (SUDAFED) 60 MG tablet Take 1 tablet (60 mg total) by mouth every 8 (eight) hours as needed for congestion. 20 tablet 0   No current facility-administered medications for this visit.     Family History  Problem Relation Age of Onset  . Hypertension Mother   . Lupus Maternal Uncle   . Multiple sclerosis Maternal Grandmother   . Cancer Maternal Grandmother        breast and bone  . Diabetes Maternal Grandmother   . Breast cancer Maternal Grandmother 63       mets to  bone  . Thyroid disease Maternal Grandmother        hypothyroid  . Heart attack Maternal Grandfather     Review of Systems  All other systems reviewed and are negative.   Exam:   BP (!) 148/80 (BP Location: Right Arm, Patient Position: Sitting, Cuff Size: Normal)   Pulse 88   Resp (!) 22   Ht 5\' 3"  (1.6 m)   Wt 155 lb (70.3 kg)   LMP 07/29/2018 (Approximate)   BMI 27.46 kg/m     General appearance: alert, cooperative and appears stated age Head: Normocephalic, without obvious abnormality, atraumatic Neck: no adenopathy, supple, symmetrical, trachea midline and thyroid normal to inspection and palpation Lungs: clear to auscultation bilaterally Breasts: normal  appearance, no masses or tenderness, No nipple retraction or dimpling, No nipple discharge or bleeding, No axillary or supraclavicular adenopathy Heart: regular rate and rhythm Abdomen: soft, non-tender; no masses, no organomegaly Extremities: extremities normal, atraumatic, no cyanosis or edema Skin: Skin color, texture, turgor normal. No rashes or lesions Lymph nodes: Cervical, supraclavicular, and axillary nodes normal. No abnormal inguinal nodes palpated Neurologic: Grossly normal  Pelvic: External genitalia:  no lesions              Urethra:  normal appearing urethra with no masses, tenderness or lesions              Bartholins and Skenes: normal                 Vagina: normal appearing vagina with normal color and discharge, no lesions              Cervix: no lesions              Pap taken: Yes.   Bimanual Exam:  Uterus:  normal size, contour, position, consistency, mobility, non-tender              Adnexa: no mass, fullness, tenderness            Chaperone was present for exam.  Assessment:   Well woman visit with normal exam. Dyspareunia.  Smoker.  Quitting.  Hx thyroid nodule.  Anxiety.  Elevated blood pressure today.   Plan: Mammogram screening. Recommended self breast awareness. Pap and HR HPV as above. Guidelines for Calcium, Vitamin D, regular exercise program including cardiovascular and weight bearing exercise. Declines serum STD screening.  Will do gonorrhea, chlamydia, trichomonas.  Declines Gardasil vaccine.  We discussed progesterone IUDs, ParaGard IUD, Nexplanon, LoLoEstrin, and POPs. Will return for [place] or Mirena.  Risks and benefits reviewed. I recommend she stop using combined oral contraception due to her elevated blood pressure.  Follow up annually and prn.   After visit summary provided.

## 2018-08-18 NOTE — Patient Instructions (Signed)

## 2018-08-21 ENCOUNTER — Ambulatory Visit: Payer: No Typology Code available for payment source | Admitting: Psychology

## 2018-08-21 DIAGNOSIS — F411 Generalized anxiety disorder: Secondary | ICD-10-CM

## 2018-08-21 LAB — CYTOLOGY - PAP: Diagnosis: NEGATIVE

## 2018-08-21 LAB — CHLAMYDIA/GONOCOCCUS/TRICHOMONAS, NAA
CHLAMYDIA BY NAA: NEGATIVE
Gonococcus by NAA: NEGATIVE
Trich vag by NAA: NEGATIVE

## 2018-09-05 ENCOUNTER — Telehealth: Payer: Self-pay | Admitting: Obstetrics and Gynecology

## 2018-09-05 ENCOUNTER — Encounter: Payer: Self-pay | Admitting: Obstetrics and Gynecology

## 2018-09-05 NOTE — Telephone Encounter (Signed)
Seen in office on 08/17/18. Hx of elevated BP and smoker.  Routing to Dr. Edward Jolly to advise.

## 2018-09-05 NOTE — Telephone Encounter (Signed)
Patient sent the following message through MyChart. Routing to triage to assist patient with request.  Hey! I've been thinking about IUD and I'm pretty nervous about it still.... is there anything else that I could try like maybe Nuvaring?

## 2018-09-06 NOTE — Telephone Encounter (Signed)
She may want to consider Nexplanon.  This lasts for 36 months, is estrogen free, and will not raise her blood pressure.   Another alternative is the progesterone only birth control pill.  On chart review, her blood pressure has been elevated at several appointments with different health care providers.  Combined contraception is not recommended in this situation.  This means it is best for her to come off her current pills and not use NuvaRing or Ortho Evra patches.

## 2018-09-06 NOTE — Telephone Encounter (Signed)
Left message to call Doreen Garretson, RN at [facility] [phone].   

## 2018-09-07 MED ORDER — NORETHINDRONE 0.35 MG PO TABS: 1.0000 | ORAL_TABLET | Freq: Every day | ORAL | 11 refills | Status: DC

## 2018-09-07 MED FILL — NORETHINDRONE 0.35 MG TAB: 0.35 | 84 days supply | Qty: 84 | Fill #0

## 2018-09-07 NOTE — Telephone Encounter (Signed)
Spoke with patient, advised per Dr. Edward Jolly. Rx for Micronor #[id number] to verified pharmacy. Patient verbalizes understanding and is agreeable.   Encounter closed.

## 2018-09-07 NOTE — Telephone Encounter (Signed)
Ok for [employer] x 12 months.

## 2018-09-07 NOTE — Telephone Encounter (Signed)
Spoke with patient, advised as seen below per Dr. Edward Jolly. Patient request to proceed with POP. Advised important to take roughly same time daily, will not have inactive pills, same pill daily. Can take 3 months for cycles to adjust, may or may not have a menses. Patient verbalizes understanding and is agreeable.  Will notify once Rx sent.   Routing to Dr. Edward Jolly to advise on POP Rx.

## 2018-09-15 ENCOUNTER — Other Ambulatory Visit (INDEPENDENT_AMBULATORY_CARE_PROVIDER_SITE_OTHER): Payer: No Typology Code available for payment source

## 2018-09-15 ENCOUNTER — Other Ambulatory Visit: Payer: Self-pay

## 2018-09-15 DIAGNOSIS — E041 Nontoxic single thyroid nodule: Secondary | ICD-10-CM

## 2018-09-15 DIAGNOSIS — Z0000 Encounter for general adult medical examination without abnormal findings: Secondary | ICD-10-CM

## 2018-09-16 LAB — CBC WITH DIFFERENTIAL/PLATELET
BASOS: 1 %
Basophils Absolute: 0.1 10*3/uL (ref 0.0–0.2)
EOS (ABSOLUTE): 0.2 10*3/uL (ref 0.0–0.4)
Eos: 3 %
Hematocrit: 38.9 % (ref 34.0–46.6)
Hemoglobin: 13.9 g/dL (ref 11.1–15.9)
Immature Grans (Abs): 0 10*3/uL (ref 0.0–0.1)
Immature Granulocytes: 0 %
Lymphocytes Absolute: 3.3 10*3/uL — ABNORMAL HIGH (ref 0.7–3.1)
Lymphs: 38 %
MCH: 31.3 pg (ref 26.6–33.0)
MCHC: 35.7 g/dL (ref 31.5–35.7)
MCV: 88 fL (ref 79–97)
Monocytes Absolute: 0.7 10*3/uL (ref 0.1–0.9)
Monocytes: 9 %
Neutrophils Absolute: 4.3 10*3/uL (ref 1.4–7.0)
Neutrophils: 49 %
Platelets: 393 10*3/uL (ref 150–450)
RBC: 4.44 x10E6/uL (ref 3.77–5.28)
RDW: 11.8 % (ref 11.7–15.4)
WBC: 8.6 10*3/uL (ref 3.4–10.8)

## 2018-09-16 LAB — COMPREHENSIVE METABOLIC PANEL
ALT: 35 IU/L — ABNORMAL HIGH (ref 0–32)
AST: 24 IU/L (ref 0–40)
Albumin/Globulin Ratio: 1.8 (ref 1.2–2.2)
Albumin: 4.4 g/dL (ref 3.9–5.0)
Alkaline Phosphatase: 105 IU/L (ref 39–117)
BUN/Creatinine Ratio: 14 (ref 9–23)
BUN: 8 mg/dL (ref 6–20)
Bilirubin Total: 0.3 mg/dL (ref 0.0–1.2)
CALCIUM: 9.3 mg/dL (ref 8.7–10.2)
CO2: 22 mmol/L (ref 20–29)
CREATININE: 0.58 mg/dL (ref 0.57–1.00)
Chloride: 99 mmol/L (ref 96–106)
GFR calc Af Amer: 151 mL/min/{1.73_m2} (ref >59)
GFR calc non Af Amer: 131 mL/min/{1.73_m2} (ref >59)
Globulin, Total: 2.5 g/dL (ref 1.5–4.5)
Glucose: 109 mg/dL — ABNORMAL HIGH (ref 65–99)
POTASSIUM: 4.2 mmol/L (ref 3.5–5.2)
Sodium: 137 mmol/L (ref 134–144)
TOTAL PROTEIN: 6.9 g/dL (ref 6.0–8.5)

## 2018-09-16 LAB — TSH: TSH: 4.41 u[IU]/mL (ref 0.450–4.500)

## 2018-09-16 LAB — T3: T3, Total: 189 ng/dL — ABNORMAL HIGH (ref 71–180)

## 2018-09-16 LAB — LIPID PANEL
CHOLESTEROL TOTAL: 165 mg/dL (ref 100–199)
Chol/HDL Ratio: 3.8 ratio (ref 0.0–4.4)
HDL: 44 mg/dL (ref >39)
LDL Calculated: 106 mg/dL — ABNORMAL HIGH (ref 0–99)
Triglycerides: 74 mg/dL (ref 0–149)
VLDL Cholesterol Cal: 15 mg/dL (ref 5–40)

## 2018-09-16 LAB — T4, FREE: Free T4: 1.15 ng/dL (ref 0.82–1.77)

## 2018-09-16 LAB — HEMOGLOBIN A1C
Est. average glucose Bld gHb Est-mCnc: 108 mg/dL
Hgb A1c MFr Bld: 5.4 % (ref 4.8–5.6)

## 2018-09-19 ENCOUNTER — Encounter: Payer: No Typology Code available for payment source | Admitting: Adult Health

## 2018-09-29 ENCOUNTER — Ambulatory Visit: Payer: No Typology Code available for payment source | Admitting: Psychology

## 2018-11-02 ENCOUNTER — Ambulatory Visit (INDEPENDENT_AMBULATORY_CARE_PROVIDER_SITE_OTHER): Payer: No Typology Code available for payment source | Admitting: Psychology

## 2018-11-02 DIAGNOSIS — F411 Generalized anxiety disorder: Secondary | ICD-10-CM | POA: Diagnosis not present

## 2018-11-27 ENCOUNTER — Encounter: Payer: Self-pay | Admitting: Obstetrics and Gynecology

## 2018-11-27 ENCOUNTER — Telehealth: Payer: Self-pay | Admitting: Obstetrics and Gynecology

## 2018-11-27 NOTE — Telephone Encounter (Signed)
Spoke with patient.   1. Patient reports soft, green stools 3-4x/day for the past 2 wks. Tenderness and soreness in right axilla, no recent injury. Denies N/V, fever/chills. Abdomen is soft. No recent food or lifestyle changes. Reports increased anxiety with "world changes", is unsure if related. Has tried to reduce coffee intake to 1 cup daily.   2. Finishing 3rd pack of POP. Reports increase in acne and menses cramps since starting POP. Has tried not wearing make and different face washes with no change. Patient asking if this can be side effects of POP? Or alternative contraceptive? Advised per review of 08/18/18 OV alternatives discussed were IUD and nexplanon. Patient states she is not ready to proceed with either of those options at this time. Patient does not have a dermatologist.   Advised patient to f/u with PCP for further evaluation pf  bowel changes and tenderness in axilla. Advised I will review with Dr. Edward Jolly and return call with any additional recommendations. Patient agreeable.   Routing to Dr. Edward Jolly

## 2018-11-27 NOTE — Telephone Encounter (Signed)
Patient sent the following correspondence through MyChart. Routing to triage to assist patient with request.  I think I have been having some side effects of the birth control but not sure if it's linked to stress or not. I have had severe acne that is almost impossible to get rid of with vitamin a, multiple face washes, not wearing makeup, etc.... also some stomach problems such as going to bathroom 3-4 day and cramps like no other. I don't know what to do at this point but I figured I'd reach out and ask for some advice - especially since I'm about to start 4th pack.     Thanks for your help!    Kristin Orozco  [phone]

## 2018-11-27 NOTE — Telephone Encounter (Signed)
The acne may be due to her new progesterone only birth control pill.  I wish I could prescribe clindamycin lotion for her face, but a side effect can be diarrhea, so this would not be recommended.   Does she want to come in for an appointment to discuss further her choices for birth control?

## 2018-11-28 MED FILL — NORETHINDRONE 0.35 MG TAB: 0.35 | 28 days supply | Qty: 28 | Fill #1

## 2018-11-28 NOTE — Telephone Encounter (Signed)
Spoke with patient. OV scheduled for 6/8 at 4pm with Dr. Edward Jolly. Patient is scheduled for 6/4 to see her PCP. Patient verbalizes understanding.  Routing to provider for final review. Patient is agreeable to disposition. Will close encounter.

## 2018-11-28 NOTE — Telephone Encounter (Signed)
Patient returning call.

## 2018-11-28 NOTE — Telephone Encounter (Signed)
Left detailed message, ok per dpr. Advised as seen below per Dr. Edward Jolly. Advised to return call to [place], [place] at [facility] [phone].

## 2018-11-29 ENCOUNTER — Ambulatory Visit: Payer: No Typology Code available for payment source | Admitting: Psychology

## 2018-11-30 ENCOUNTER — Other Ambulatory Visit: Payer: Self-pay

## 2018-11-30 ENCOUNTER — Ambulatory Visit (INDEPENDENT_AMBULATORY_CARE_PROVIDER_SITE_OTHER): Payer: No Typology Code available for payment source | Admitting: Adult Health

## 2018-11-30 ENCOUNTER — Encounter: Payer: Self-pay | Admitting: Adult Health

## 2018-11-30 VITALS — BP 137/80 | HR 97 | Temp 98.9°F | Ht 63.0 in | Wt 160.0 lb

## 2018-11-30 DIAGNOSIS — R591 Generalized enlarged lymph nodes: Secondary | ICD-10-CM | POA: Diagnosis not present

## 2018-11-30 DIAGNOSIS — R030 Elevated blood-pressure reading, without diagnosis of hypertension: Secondary | ICD-10-CM

## 2018-11-30 DIAGNOSIS — Z0000 Encounter for general adult medical examination without abnormal findings: Secondary | ICD-10-CM

## 2018-11-30 DIAGNOSIS — R195 Other fecal abnormalities: Secondary | ICD-10-CM | POA: Diagnosis not present

## 2018-11-30 NOTE — Progress Notes (Signed)
Subjective:    Patient ID: Kristin Orozco, female    DOB: 12/12/1995, 23 y.o.   MRN: [id number]  HPI: 07/06/2018 OV: Ms. Kristin Orozco is here to establish as a new pt. She is a pleasant 23 year old female. PMH: Thyroid nodule dx'd 2014, last Thyroid US 2016 She reports fatigue, memory issues, and weight fluctuations since 2014 She reports migraine without aura She has been on Loestrin FE for years- denies SE She estimates to drink >64 ox water/day and consistently "eats well" She denies regular exercise She is trying to quit smoking, only using 3-6 cigarettes/day She uses ETOH socially She reports sig stress r/t to caring for her mother- [place] [employer] veteran with sig PTSD and service connected disability, however is unable to work or receive surgical intervention for musculoskeletal conditions due to heavy tobacco use and overall poor condition She is in stable 6 year relationship 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 [name] presents with several issues: 1) R axillary lymphadenopathy that began 1 week ago.  Area is tender to the touch, described as "uncomfortable", rated 1/10.  She reports this occurring intermittently for the last She denies fever/night sweats/N/V/unexplained weight loss. She denies first degree family hx of cancer, specifically leukemia or lymphoma She reports last HIV screening was negative and she has been in monogamous relationship since then  2) Change in bowel habits at beginning of menses: Soft stool that is green in color with strong odor. She reports 2-3 BMs day She denies hematochezia She denies first degree family hx of colon ca These sx's have ben present since her OB/GYN changed her oral birth control from Norethindrone-ethinyl estradiol 1-20 to norethindrone 0.35mg  approx 3 months ago. She reports her typical bowel habits were 1 BM, well formed stool. She denies abdominal pain.  Of note her initial BP was quite  elevated. She provided BP readings that she took at work- SBP 120-130s DBP 80-90s She continues to use tobacco 3-4 cigarettes/day She denies acute cardiac sx's   Patient Care Team    Relationship Specialty Notifications Start End  Julaine Fusi, NP PCP - General Family Medicine  06/08/18     Patient Active Problem List   Diagnosis Date Noted  . Lymphadenopathy 11/30/2018  . Elevated BP without diagnosis of hypertension 11/30/2018  . Loose stools 11/30/2018  . Healthcare maintenance 07/06/2018  . Thyroid nodule 07/06/2018  . Stress 07/06/2018  . Loss of consciousness (HCC) 12/14/2013     Past Medical History:  Diagnosis Date  . Anxiety   . Syncope   . Tachycardia   . Thyroid disease    Hx of thyroid nodule--followed by PCP     History reviewed. No pertinent surgical history.   Family History  Problem Relation Age of Onset  . Hypertension Mother   . Lupus Maternal Uncle   . Multiple sclerosis Maternal Grandmother   . Cancer Maternal Grandmother        breast and bone  . Diabetes Maternal Grandmother   . Breast cancer Maternal Grandmother 63       mets to bone  . Thyroid disease Maternal Grandmother        hypothyroid  . Heart attack Maternal Grandfather      Social History   Substance and Sexual Activity  Drug Use No     Social History   Substance and Sexual Activity  Alcohol Use Yes  . Alcohol/week: 3.0 standard drinks  . Types: 3  Glasses of wine per week   Comment: rarely     Social History   Tobacco Use  Smoking Status Current Every Day Smoker  . Packs/day: 0.25  . Years: 6.00  . Pack years: 1.50  . Types: Cigarettes  Smokeless Tobacco Never Used     Outpatient Encounter Medications as of 11/30/2018  Medication Sig  . cetirizine (ZYRTEC) 10 MG tablet Take 1 tablet (10 mg total) by mouth daily.  . cyclobenzaprine (FLEXERIL) 5 MG tablet Take 1 tablet (5 mg total) by mouth 3 (three) times daily as needed for muscle spasms.  . DENTA  5000 PLUS 1.1 % CREA dental cream USE ONCE OR TWICE PER DAY. AT LEAST ONCE BEFORE BEDTIME  . fluticasone (FLONASE) 50 MCG/ACT nasal spray Place 2 sprays into both nostrils daily.  . naproxen (NAPROSYN) 500 MG tablet Take 1 tablet (500 mg total) by mouth 2 (two) times daily with a meal.  . norethindrone (MICRONOR,CAMILA,ERRIN) 0.35 MG tablet Take 1 tablet (0.35 mg total) by mouth daily.  . ondansetron (ZOFRAN) 4 MG tablet Take 1 tablet (4 mg total) by mouth every 8 (eight) hours as needed for nausea or vomiting.  . pseudoephedrine (SUDAFED) 60 MG tablet Take 1 tablet (60 mg total) by mouth every 8 (eight) hours as needed for congestion.  . [DISCONTINUED] norethindrone-ethinyl estradiol (JUNEL FE,GILDESS FE,LOESTRIN FE) 1-20 MG-MCG tablet Take 1 tablet by mouth daily.   No facility-administered encounter medications on file as of 11/30/2018.     Allergies: Patient has no known allergies.  Body mass index is 28.34 kg/m.  Blood pressure 137/80, pulse 97, temperature 98.9 F (37.2 C), height 5\' 3"  (1.6 m), weight 160 lb (72.6 kg), SpO2 98 %.  Review of Systems  Constitutional: Positive for fatigue. Negative for activity change, appetite change, chills, diaphoresis, fever and unexpected weight change.  Eyes: Negative for visual disturbance.  Respiratory: Negative for cough, chest tightness, shortness of breath, wheezing and stridor.   Cardiovascular: Negative for chest pain, palpitations and leg swelling.  Gastrointestinal: Positive for diarrhea. Negative for abdominal distention, abdominal pain, blood in stool, constipation, nausea and vomiting.  Endocrine: Negative for cold intolerance, heat intolerance, polydipsia, polyphagia and polyuria.  Genitourinary: Negative for difficulty urinating, flank pain and hematuria.  Neurological: Negative for dizziness and headaches.  Hematological: Positive for adenopathy. Does not bruise/bleed easily.       Objective:   Physical Exam Constitutional:       General: She is not in acute distress.    Appearance: Normal appearance. She is not ill-appearing, toxic-appearing or diaphoretic.  Eyes:     Extraocular Movements: Extraocular movements intact.     Conjunctiva/sclera: Conjunctivae normal.     Pupils: Pupils are equal, round, and reactive to light.  Cardiovascular:     Rate and Rhythm: Normal rate.     Pulses: Normal pulses.     Heart sounds: Normal heart sounds. No murmur. No friction rub. No gallop.   Pulmonary:     Effort: Pulmonary effort is normal. No respiratory distress.     Breath sounds: Normal breath sounds. No stridor. No wheezing, rhonchi or rales.  Chest:     Chest wall: No tenderness.     Comments: R Axillary- palpable, tender lymph node  Abdominal:     General: Bowel sounds are normal. There is no distension.     Palpations: Abdomen is soft.     Tenderness: There is no abdominal tenderness. There is no right CVA tenderness, left CVA tenderness, guarding  or rebound. Negative signs include Murphy's sign, Rovsing's sign, McBurney's sign and psoas sign.  Musculoskeletal:        General: Tenderness present.  Lymphadenopathy:     Upper Body:     Right upper body: Axillary adenopathy present.  Skin:    General: Skin is warm and dry.     Capillary Refill: Capillary refill takes less than 2 seconds.     Findings: No erythema.  Neurological:     Mental Status: She is alert and oriented to person, place, and time.  Psychiatric:        Mood and Affect: Mood normal.        Behavior: Behavior normal.        Thought Content: Thought content normal.        Judgment: Judgment normal.       Assessment & Plan:   1. Lymphadenopathy   2. Elevated BP without diagnosis of hypertension   3. Loose stools   4. Healthcare maintenance     Elevated BP without diagnosis of hypertension 6) Check blood pressure and heart rate daily, record on log. Call clinic in two weeks with readings.  If still consistently >130/80, will  discuss starting a medication to lower your blood pressure. 7) Stop tobacco use- YOU CAN DO IT!   Loose stools 3) Re-start your ProBiotic and continue to consume yogurt. 4) Follow-up with your OB/GYN to discuss your birth control. 5) Complete and return stool cards, bring cards here.   Healthcare maintenance 1) I will send you MyChart message you with your lab results tomorrow. 2) Ultrasound has been ordered for your right axillary swollen lymph node, someone from the imaging center will call you to schedule. 3) Re-start your ProBiotic and continue to consume yogurt. 4) Follow-up with your OB/GYN to discuss your birth control. 5) Complete and return stool cards, bring cards here. 6) Check blood pressure and heart rate daily, record on log. Call clinic in two weeks with readings.  If still consistently >130/80, will discuss starting a medication to lower your blood pressure. 7) Stop tobacco use- YOU CAN DO IT! 8) Continue to social distance and wear a mask when in public.  Lymphadenopathy 1) I will send you MyChart message you with your lab results tomorrow. 2) Ultrasound has been ordered for your right axillary swollen lymph node, someone from the imaging center will call you to schedule.     FOLLOW-UP:  No follow-ups on file.

## 2018-11-30 NOTE — Assessment & Plan Note (Signed)
3) Re-start your ProBiotic and continue to consume yogurt. 4) Follow-up with your OB/GYN to discuss your birth control. 5) Complete and return stool cards, bring cards here.

## 2018-11-30 NOTE — Patient Instructions (Addendum)
Lymphadenopathy  Lymphadenopathy means that your lymph glands are swollen or larger than normal (enlarged). Lymph glands, also called lymph nodes, are collections of tissue that filter bacteria, viruses, and waste from your bloodstream. They are part of your body's disease-fighting system (immune system), which protects your body from germs. There may be different causes of lymphadenopathy, depending on where it is in your body. Some types go away on their own. Lymphadenopathy can occur anywhere that you have lymph glands, including these areas:  Neck (cervical lymphadenopathy).  Chest (mediastinal lymphadenopathy).  Lungs (hilar lymphadenopathy).  Underarms (axillary lymphadenopathy).  Groin (inguinal lymphadenopathy). When your immune system responds to germs, infection-fighting cells and fluid build up in your lymph glands. This causes some swelling and enlargement. If the lymph glands do not go back to normal after you have an infection or disease, your health care provider may do tests. These tests help to monitor your condition and find the reason why the glands are still swollen and enlarged. Follow these instructions at home:  Get plenty of rest.  Take over-the-counter and prescription medicines only as told by your health care provider. Your health care provider may recommend over-the-counter medicines for pain.  If directed, apply heat to swollen lymph glands as often as told by your health care provider. Use the heat source that your health care provider recommends, such as a moist heat pack or a heating pad. ? Place a towel between your skin and the heat source. ? Leave the heat on for 20-30 minutes. ? Remove the heat if your skin turns bright red. This is especially important if you are unable to feel pain, heat, or cold. You may have a greater risk of getting burned.  Check your affected lymph glands every day for changes. Check other lymph gland areas as told by your health  care provider. Check for changes such as: ? More swelling. ? Sudden increase in size. ? Redness or pain. ? Hardness.  Keep all follow-up visits as told by your health care provider. This is important. Contact a health care provider if you have:  Swelling that gets worse or spreads to other areas.  Problems with breathing.  Lymph glands that: ? Are still swollen after 2 weeks. ? Have suddenly gotten bigger. ? Are red, painful, or hard.  A fever or chills.  Fatigue.  A sore throat.  Pain in your abdomen.  Weight loss.  Night sweats. Get help right away if you have:  Fluid leaking from an enlarged lymph gland.  Severe pain.  Chest pain.  Shortness of breath. Summary  Lymphadenopathy means that your lymph glands are swollen or larger than normal (enlarged).  Lymph glands (also called lymph nodes) are collections of tissue that filter bacteria, viruses, and waste from the bloodstream. They are part of your body's disease-fighting system (immune system).  Lymphadenopathy can occur anywhere that you have lymph glands.  If your enlarged and swollen lymph glands do not go back to normal after you have an infection or disease, your health care provider may do tests to monitor your condition and find the reason why the glands are still swollen and enlarged.  Check your affected lymph glands every day for changes. Check other lymph gland areas as told by your health care provider. This information is not intended to replace advice given to you by your health care provider. Make sure you discuss any questions you have with your health care provider. Document Released: 03/23/2008 Document Revised: 04/29/2017 Document Reviewed:  04/29/2017 Elsevier Interactive Patient Education  2019 [employer].  1) I will send you MyChart message you with your lab results tomorrow. 2) Ultrasound has been ordered for your right axillary swollen lymph node, someone from the imaging center will  call you to schedule. 3) Re-start your ProBiotic and continue to consume yogurt. 4) Follow-up with your OB/GYN to discuss your birth control. 5) Complete and return stool cards, bring cards here. 6) Check blood pressure and heart rate daily, record on log. Call clinic in two weeks with readings.  If still consistently >130/80, will discuss starting a medication to lower your blood pressure. 7) Stop tobacco use- YOU CAN DO IT! 8) Continue to social distance and wear a mask when in public. GREAT TO SEE YOU!

## 2018-11-30 NOTE — Assessment & Plan Note (Addendum)
6) Check blood pressure and heart rate daily, record on log. Call clinic in two weeks with readings.  If still consistently >130/80, will discuss starting a medication to lower your blood pressure. 7) Stop tobacco use- YOU CAN DO IT!

## 2018-11-30 NOTE — Assessment & Plan Note (Signed)
1) I will send you MyChart message you with your lab results tomorrow. 2) Ultrasound has been ordered for your right axillary swollen lymph node, someone from the imaging center will call you to schedule. 3) Re-start your ProBiotic and continue to consume yogurt. 4) Follow-up with your OB/GYN to discuss your birth control. 5) Complete and return stool cards, bring cards here. 6) Check blood pressure and heart rate daily, record on log. Call clinic in two weeks with readings.  If still consistently >130/80, will discuss starting a medication to lower your blood pressure. 7) Stop tobacco use- YOU CAN DO IT! 8) Continue to social distance and wear a mask when in public.

## 2018-11-30 NOTE — Assessment & Plan Note (Signed)
1) I will send you MyChart message you with your lab results tomorrow. 2) Ultrasound has been ordered for your right axillary swollen lymph node, someone from the imaging center will call you to schedule.

## 2018-12-01 ENCOUNTER — Ambulatory Visit: Payer: No Typology Code available for payment source | Admitting: Psychology

## 2018-12-01 LAB — CBC WITH DIFFERENTIAL/PLATELET
Basophils Absolute: 0.1 10*3/uL (ref 0.0–0.2)
Basos: 0 %
EOS (ABSOLUTE): 0.3 10*3/uL (ref 0.0–0.4)
Eos: 2 %
Hematocrit: 42.2 % (ref 34.0–46.6)
Hemoglobin: 14.8 g/dL (ref 11.1–15.9)
Immature Grans (Abs): 0 10*3/uL (ref 0.0–0.1)
Immature Granulocytes: 0 %
Lymphocytes Absolute: 3.4 10*3/uL — ABNORMAL HIGH (ref 0.7–3.1)
Lymphs: 27 %
MCH: 30.7 pg (ref 26.6–33.0)
MCHC: 35.1 g/dL (ref 31.5–35.7)
MCV: 88 fL (ref 79–97)
Monocytes Absolute: 1 10*3/uL — ABNORMAL HIGH (ref 0.1–0.9)
Monocytes: 8 %
Neutrophils Absolute: 8 10*3/uL — ABNORMAL HIGH (ref 1.4–7.0)
Neutrophils: 63 %
Platelets: 365 10*3/uL (ref 150–450)
RBC: 4.82 x10E6/uL (ref 3.77–5.28)
RDW: 11.9 % (ref 11.7–15.4)
WBC: 12.8 10*3/uL — ABNORMAL HIGH (ref 3.4–10.8)

## 2018-12-04 ENCOUNTER — Other Ambulatory Visit: Payer: Self-pay

## 2018-12-04 ENCOUNTER — Encounter: Payer: Self-pay | Admitting: Obstetrics and Gynecology

## 2018-12-04 ENCOUNTER — Ambulatory Visit (INDEPENDENT_AMBULATORY_CARE_PROVIDER_SITE_OTHER): Payer: No Typology Code available for payment source | Admitting: Obstetrics and Gynecology

## 2018-12-04 VITALS — BP 102/60 | HR 88 | Temp 98.3°F | Resp 14 | Wt 156.0 lb

## 2018-12-04 DIAGNOSIS — Z3009 Encounter for other general counseling and advice on contraception: Secondary | ICD-10-CM | POA: Diagnosis not present

## 2018-12-04 NOTE — Progress Notes (Signed)
GYNECOLOGY  VISIT   HPI: 23 y.o.   Single  Caucasian  female   [id number] with Patient's last menstrual period was 12/01/2018.   here to discuss alternative contraception. Patient was having loose stool with last period.    She started to have acne also.  She had recent axillary lymph node swelling. She saw her PCP about this.   She will have an [facility]   She is also going to have testing for IBS through PCP.   Has elevated BP so combined oral contraception was previously stopped.  She was placed on Micronor.   She has a history of painful menses and painful sex.  Stable partner for 6 years.  Neg GC/CT on 08/18/18.   Smoker.   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  Stressful.  She is very anxious.  Used Buspar, Lexapro, Wellbutrin, and another SSRI. She had a lot of reactions to these. [facility]  She has not seen a psychiatrist.   GYNECOLOGIC HISTORY: Patient's last menstrual period was 12/01/2018. Contraception:  OCP Menopausal hormone therapy:  n/a Last mammogram:  n/a Last pap smear:   08/18/18 Negative        OB History    Gravida  0   Para  0   Term  0   Preterm  0   AB  0   Living  0     SAB  0   TAB  0   Ectopic  0   Multiple  0   Live Births  0              Patient Active Problem List   Diagnosis Date Noted  . Lymphadenopathy 11/30/2018  . Elevated BP without diagnosis of hypertension 11/30/2018  . Loose stools 11/30/2018  . Healthcare maintenance 07/06/2018  . Thyroid nodule 07/06/2018  . Stress 07/06/2018  . Loss of consciousness (HCC) 12/14/2013    Past Medical History:  Diagnosis Date  . Anxiety   . Syncope   . Tachycardia   . Thyroid disease    Hx of thyroid nodule--followed by PCP    No past surgical history on file.  Current Outpatient Medications  Medication Sig Dispense Refill  . DENTA 5000 PLUS 1.1 % CREA  dental cream USE ONCE OR TWICE PER DAY. AT LEAST ONCE BEFORE BEDTIME    . fluticasone (FLONASE) 50 MCG/ACT nasal spray Place 2 sprays into both nostrils daily. 16 g 0  . naproxen (NAPROSYN) 500 MG tablet Take 1 tablet (500 mg total) by mouth 2 (two) times daily with a meal. 20 tablet 0  . norethindrone (MICRONOR,CAMILA,ERRIN) 0.35 MG tablet Take 1 tablet (0.35 mg total) by mouth daily. 1 Package 11  . ondansetron (ZOFRAN) 4 MG tablet Take 1 tablet (4 mg total) by mouth every 8 (eight) hours as needed for nausea or vomiting. 20 tablet 0  . pseudoephedrine (SUDAFED) 60 MG tablet Take 1 tablet (60 mg total) by mouth every 8 (eight) hours as needed for congestion. 20 tablet 0  . cetirizine (ZYRTEC) 10 MG tablet Take 1 tablet (10 mg total) by mouth daily. (Patient not taking: Reported on 12/04/2018) 30 tablet 0  . cyclobenzaprine (FLEXERIL) 5 MG tablet Take 1 tablet (5 mg total) by mouth 3 (three) times daily as needed for muscle spasms. (Patient not taking: Reported on 12/04/2018) 15 tablet 0   No current facility-administered medications for this visit.  ALLERGIES: Patient has no known allergies.  Family History  Problem Relation Age of Onset  . Hypertension Mother   . Lupus Maternal Uncle   . Multiple sclerosis Maternal Grandmother   . Cancer Maternal Grandmother        breast and bone  . Diabetes Maternal Grandmother   . Breast cancer Maternal Grandmother 63       mets to bone  . Thyroid disease Maternal Grandmother        hypothyroid  . Heart attack Maternal Grandfather     Social History   Socioeconomic History  . Marital status: Single    Spouse name: Not on file  . Number of children: Not on file  . Years of education: Not on file  . Highest education level: Not on file  Occupational History  . Not on file  Social Needs  . Financial resource strain: Not on file  . Food insecurity:    Worry: Not on file    Inability: Not on file  . Transportation needs:    Medical: Not  on file    Non-medical: Not on file  Tobacco Use  . Smoking status: Current Every Day Smoker    Packs/day: 0.25    Years: 6.00    Pack years: 1.50    Types: Cigarettes  . Smokeless tobacco: Never Used  Substance and Sexual Activity  . Alcohol use: Yes    Alcohol/week: 3.0 standard drinks    Types: 3 Glasses of wine per week    Comment: rarely  . Drug use: No  . Sexual activity: Yes    Birth control/protection: Pill    Comment: Junel   Lifestyle  . Physical activity:    Days per week: Not on file    Minutes per session: Not on file  . Stress: Not on file  Relationships  . Social connections:    Talks on phone: Not on file    Gets together: Not on file    Attends religious service: Not on file    Active member of club or organization: Not on file    Attends meetings of clubs or organizations: Not on file    Relationship status: Not on file  . Intimate partner violence:    Fear of current or ex partner: Not on file    Emotionally abused: Not on file    Physically abused: Not on file    Forced sexual activity: Not on file  Other Topics Concern  . Not on file  Social History Narrative   Patient is single, with no children   Patient is a high school graduate   Patient is right handed   Patient drinks 5 or more    Review of Systems  Constitutional: Negative.   HENT: Negative.   Eyes: Negative.   Respiratory: Negative.   Cardiovascular: Negative.   Gastrointestinal:       Loose stool  Endocrine: Negative.   Genitourinary: Negative.   Musculoskeletal: Negative.   Skin: Negative.   Allergic/Immunologic: Negative.   Neurological: Negative.   Hematological: Negative.   Psychiatric/Behavioral: Negative.     PHYSICAL EXAMINATION:    BP 102/60 (BP Location: Left Arm, Patient Position: Sitting, Cuff Size: Normal)   Pulse 88   Temp 98.3 F (36.8 C) (Temporal)   Resp 14   Wt 156 lb (70.8 kg)   LMP 12/01/2018   BMI 27.63 kg/m     General appearance: alert,  cooperative and appears stated age  ASSESSMENT  Dysmenorrhea  and dyspareunia. Use of progesterone only pills.  Diarrhea.  Uncertain etiology.  Acne.  Situational stress.   PLAN  We discussed reasons for increased acne - stopping combined oral contraception and starting progesterone only pills.  Cleocin T not recommended due to her recent loose stools.  We reviewed alternatives of Depo Provera, Nexplanon, IUDs - progesterone and copper, and continuation of the current Micronor.  Risks and benefits reviewed.  She may consider a progesterone based IUD.  I would recommend a paracervical block and Cytotec 200 mcg at hs the night prior to procedure and the am of procedure.  We discussed her asking for a psychiatry referral through her PCP to find an option for stress and anxiety treatment.   An After Visit Summary was printed and given to the patient.  __20____ minutes face to face time of which over 50% was spent in counseling.

## 2018-12-04 NOTE — Patient Instructions (Signed)

## 2018-12-11 ENCOUNTER — Ambulatory Visit
Admission: RE | Admit: 2018-12-11 | Discharge: 2018-12-11 | Disposition: A | Discharge status: Home / Self Care | Payer: No Typology Code available for payment source | Source: Ambulatory Visit | Attending: Adult Health | Admitting: Adult Health

## 2018-12-11 ENCOUNTER — Other Ambulatory Visit: Payer: Self-pay

## 2018-12-11 DIAGNOSIS — R591 Generalized enlarged lymph nodes: Secondary | ICD-10-CM

## 2018-12-14 ENCOUNTER — Ambulatory Visit (INDEPENDENT_AMBULATORY_CARE_PROVIDER_SITE_OTHER): Payer: No Typology Code available for payment source | Admitting: Adult Health

## 2018-12-14 ENCOUNTER — Encounter: Payer: Self-pay | Admitting: Adult Health

## 2018-12-14 ENCOUNTER — Other Ambulatory Visit: Payer: Self-pay

## 2018-12-14 DIAGNOSIS — E041 Nontoxic single thyroid nodule: Secondary | ICD-10-CM | POA: Diagnosis not present

## 2018-12-14 DIAGNOSIS — F411 Generalized anxiety disorder: Secondary | ICD-10-CM | POA: Diagnosis not present

## 2018-12-14 DIAGNOSIS — Z0000 Encounter for general adult medical examination without abnormal findings: Secondary | ICD-10-CM | POA: Diagnosis not present

## 2018-12-14 MED ORDER — FLUOXETINE HCL 20 MG PO TABS: ORAL_TABLET | ORAL | 0 refills | Status: DC

## 2018-12-14 MED ORDER — DENTA 5000 PLUS 1.1 % DT CREA: TOPICAL_CREAM | DENTAL | 0 refills | Status: DC

## 2018-12-14 MED FILL — DENTA 5000 PLUS CREAM: 1.1 | 30 days supply | Qty: 51 | Fill #0

## 2018-12-14 MED FILL — FLUOXETINE HCL 20 MG TABS: 20 | 30 days supply | Qty: 30 | Fill #0

## 2018-12-14 NOTE — Assessment & Plan Note (Signed)
For anxiety- please start Fluoxetine (Prozac) 20mg - 1/2 tab once daily for one week, then increase to one full tablet-hold at this dose. Continue with therapist. TeleMedicine f/u in 4 weeks

## 2018-12-14 NOTE — Assessment & Plan Note (Signed)
Continue all medications as directed. For anxiety- please start Fluoxetine (Prozac) 20mg - 1/2 tab once daily for one week, then increase to one full tablet-hold at this dose. Continue with therapist. Try [employer]'s Natural Deodorant to address excessive sweating. Increase water intake, strive for at least 75 ounces/day.   Follow Heart Healthy diet Increase regular exercise.  Recommend at least 30 minutes daily, 5 days per week of walking, jogging, biking, swimming, YouTube/Pinterest workout videos. Please schedule TeleMedicine appt in 4 weeks. Continue to social distance and when out in public, wear a mask.

## 2018-12-14 NOTE — Progress Notes (Signed)
Subjective:    Patient ID: Kristin Orozco, female    DOB: March 11, 1996, 23 y.o.   MRN: [id number]  HPI:  Kristin Orozco presents for CPE She continues to experience daily loose stools (1-3 stools/day). She denies hematuria/hematochezia BP and HR quite elevated at initial check She reports syncopal event 2015  2D Echo- - Left ventricle: The cavity size was normal. Systolic function was normal. Wall motion was normal; there were no regional wall motion abnormalities.  She denies CP/dyspnea/dizziness/palpitations.  She reports increase in general anxiety and stress r/t family issues, work, and "COVID-19 situation". She has continued with her therapist, every 3-4 weeks. She denies SI/HI She continues to smoke cigarettes/day Denies ETOH use She has never been evaluated for ADHD, but reports her mother and her brother both dx'd with ADHD  Reviewed recent R Axillary [place] Results- IMPRESSION: No suspicious findings in the right axilla. Negative for lymphadenopathy.  A small area of axillary breast parenchyma is imaged, a normal variant. This can be associated with intermittent axillary fullness and tenderness, usually cyclical.  RECOMMENDATION: Screening mammogram at age 79 unless there are persistent or intervening clinical concerns. (Code:SM-B-40A)  08/2018 Labs  CMP-stable  CBC-stable  A1c-5.4  Lipid Panel-  LDL- 106  TSH-WNL, 4.410  Free T4-WNL, 1.15  T3- slightly elevated 189 (71-180)   Healthcare Maintenance: PAP- UTD, 08/18/2018- normal Immunizations-UTD  Patient Care Team    Relationship Specialty Notifications Start End  Esaw Grandchild, NP PCP - General Family Medicine  06/08/18     Patient Active Problem List   Diagnosis Date Noted  . GAD (generalized anxiety disorder) 12/14/2018  . Lymphadenopathy 11/30/2018  . Elevated BP without diagnosis of hypertension 11/30/2018  . Loose stools 11/30/2018  . Healthcare maintenance 07/06/2018  . Thyroid nodule  07/06/2018  . Stress 07/06/2018  . Loss of consciousness ([place]) 12/14/2013     Past Medical History:  Diagnosis Date  . Anxiety   . Syncope   . Tachycardia   . Thyroid disease    Hx of thyroid nodule--followed by PCP     No past surgical history on file.   Family History  Problem Relation Age of Onset  . Hypertension Mother   . Lupus Maternal Uncle   . Multiple sclerosis Maternal Grandmother   . Cancer Maternal Grandmother        breast and bone  . Diabetes Maternal Grandmother   . Breast cancer Maternal Grandmother 63       mets to bone  . Thyroid disease Maternal Grandmother        hypothyroid  . Heart attack Maternal Grandfather      Social History   Substance and Sexual Activity  Drug Use No     Social History   Substance and Sexual Activity  Alcohol Use Yes  . Alcohol/week: 3.0 standard drinks  . Types: 3 Glasses of wine per week   Comment: rarely     Social History   Tobacco Use  Smoking Status Current Every Day Smoker  . Packs/day: 0.25  . Years: 6.00  . Pack years: 1.50  . Types: Cigarettes  Smokeless Tobacco Never Used     Outpatient Encounter Medications as of 12/14/2018  Medication Sig  . cetirizine (ZYRTEC) 10 MG tablet Take 1 tablet (10 mg total) by mouth daily.  . cyclobenzaprine (FLEXERIL) 5 MG tablet Take 1 tablet (5 mg total) by mouth 3 (three) times daily as needed for muscle spasms.  . DENTA 5000 PLUS 1.1 %  CREA dental cream USE ONCE OR TWICE PER DAY. AT LEAST ONCE BEFORE BEDTIME  . fluticasone (FLONASE) 50 MCG/ACT nasal spray Place 2 sprays into both nostrils daily.  . naproxen (NAPROSYN) 500 MG tablet Take 1 tablet (500 mg total) by mouth 2 (two) times daily with a meal.  . norethindrone (MICRONOR,CAMILA,ERRIN) 0.35 MG tablet Take 1 tablet (0.35 mg total) by mouth daily.  . ondansetron (ZOFRAN) 4 MG tablet Take 1 tablet (4 mg total) by mouth every 8 (eight) hours as needed for nausea or vomiting.  . pseudoephedrine (SUDAFED)  60 MG tablet Take 1 tablet (60 mg total) by mouth every 8 (eight) hours as needed for congestion.  . [DISCONTINUED] DENTA 5000 PLUS 1.1 % CREA dental cream USE ONCE OR TWICE PER DAY. AT LEAST ONCE BEFORE BEDTIME  . FLUoxetine (PROZAC) 20 MG tablet 1/2 tablet daily for one week, then increase to full tablet.   No facility-administered encounter medications on file as of 12/14/2018.     Allergies: Patient has no known allergies.  Body mass index is 27.86 kg/m.  Blood pressure 134/74, pulse 80, temperature 99.1 F (37.3 C), height 5\' 3"  (1.6 m), weight 157 lb 4.8 oz (71.4 kg), last menstrual period 12/01/2018, SpO2 100 %.     Review of Systems  Constitutional: Positive for fatigue. Negative for activity change, appetite change, chills, diaphoresis and fever.  HENT: Negative for congestion.   Eyes: Negative for visual disturbance.  Respiratory: Negative for cough, chest tightness, shortness of breath, wheezing and stridor.   Cardiovascular: Negative for chest pain, palpitations and leg swelling.  Gastrointestinal: Positive for diarrhea. Negative for abdominal distention, abdominal pain, blood in stool, constipation, nausea and vomiting.  Endocrine: Negative for cold intolerance, heat intolerance, polydipsia, polyphagia and polyuria.  Genitourinary: Negative for difficulty urinating and flank pain.  Musculoskeletal: Negative for arthralgias, back pain, gait problem, joint swelling, myalgias, neck pain and neck stiffness.  Skin: Negative for color change, pallor, rash and wound.  Neurological: Negative for dizziness, weakness and headaches.  Hematological: Negative for adenopathy. Does not bruise/bleed easily.  Psychiatric/Behavioral: Positive for sleep disturbance. Negative for agitation, behavioral problems, confusion, decreased concentration, dysphoric mood, hallucinations, self-injury and suicidal ideas. The patient is nervous/anxious and is hyperactive.        Objective:   Physical  Exam Vitals signs and nursing note reviewed.  Constitutional:      General: She is not in acute distress.    Appearance: Normal appearance. She is normal weight. She is not ill-appearing, toxic-appearing or diaphoretic.  HENT:     Head: Normocephalic and atraumatic.     Right Ear: Tympanic membrane, ear canal and external ear normal. There is no impacted cerumen.     Left Ear: Tympanic membrane, ear canal and external ear normal. There is no impacted cerumen.     Nose: Nose normal. No congestion.     Mouth/Throat:     Mouth: Mucous membranes are moist.     Pharynx: No oropharyngeal exudate.  Eyes:     Extraocular Movements: Extraocular movements intact.     Conjunctiva/sclera: Conjunctivae normal.     Pupils: Pupils are equal, round, and reactive to light.  Cardiovascular:     Rate and Rhythm: Normal rate.     Pulses: Normal pulses.     Heart sounds: Normal heart sounds. No murmur. No friction rub. No gallop.   Pulmonary:     Effort: Pulmonary effort is normal. No respiratory distress.     Breath sounds: Normal breath  sounds. No stridor. No wheezing, rhonchi or rales.  Chest:     Chest wall: No tenderness.  Abdominal:     General: Bowel sounds are normal. There is no distension.     Palpations: Abdomen is soft. There is no mass.     Tenderness: There is no abdominal tenderness. There is no right CVA tenderness, left CVA tenderness, guarding or rebound.     Hernia: No hernia is present.  Musculoskeletal: Normal range of motion.        General: No tenderness.  Skin:    General: Skin is warm and dry.     Capillary Refill: Capillary refill takes less than 2 seconds.     Coloration: Skin is not pale.  Neurological:     Mental Status: She is alert and oriented to person, place, and time.     Coordination: Coordination normal.  Psychiatric:        Attention and Perception: Attention normal.        Mood and Affect: Affect normal. Mood is anxious.        Speech: Speech is rapid and  pressured.        Behavior: Behavior normal.        Thought Content: Thought content normal.        Cognition and Memory: Cognition and memory normal.        Judgment: Judgment normal.       Assessment & Plan:   1. Healthcare maintenance   2. Thyroid nodule   3. GAD (generalized anxiety disorder)     Healthcare maintenance Continue all medications as directed. For anxiety- please start Fluoxetine (Prozac) 20mg - 1/2 tab once daily for one week, then increase to one full tablet-hold at this dose. Continue with therapist. Try [employer] Deodorant to address excessive sweating. Increase water intake, strive for at least 75 ounces/day.   Follow Heart Healthy diet Increase regular exercise.  Recommend at least 30 minutes daily, 5 days per week of walking, jogging, biking, swimming, YouTube/Pinterest workout videos. Please schedule TeleMedicine appt in 4 weeks. Continue to social distance and when out in public, wear a mask.  Thyroid nodule 08/2018 TSH-WNL, 4.410  Free T4-WNL, 1.15  T3- slightly elevated 189 (71-180)   GAD (generalized anxiety disorder) For anxiety- please start Fluoxetine (Prozac) 20mg - 1/2 tab once daily for one week, then increase to one full tablet-hold at this dose. Continue with therapist. TeleMedicine f/u in 4 weeks    FOLLOW-UP:  Return in about 4 weeks (around 01/11/2019) for Evaluate Medication Effectiveness, Anxiety, TeleMedicine Follow-Up.

## 2018-12-14 NOTE — Assessment & Plan Note (Signed)
08/2018 TSH-WNL, 4.410  Free T4-WNL, 1.15  T3- slightly elevated 189 (71-180)

## 2018-12-14 NOTE — Patient Instructions (Addendum)
Preventive Care for Adults, Female  A healthy lifestyle and preventive care can promote health and wellness. Preventive health guidelines for women include the following key practices.   A routine yearly physical is a good way to check with your health care provider about your health and preventive screening. It is a chance to share any concerns and updates on your health and to receive a thorough exam.   Visit your dentist for a routine exam and preventive care every 6 months. Brush your teeth twice a day and floss once a day. Good oral hygiene prevents tooth decay and gum disease.   The frequency of eye exams is based on your age, health, family medical history, use of contact lenses, and other factors. Follow your health care provider's recommendations for frequency of eye exams.   Eat a healthy diet. Foods like vegetables, fruits, whole grains, low-fat dairy products, and lean protein foods contain the nutrients you need without too many calories. Decrease your intake of foods high in solid fats, added sugars, and salt. Eat the right amount of calories for you.Get information about a proper diet from your health care provider, if necessary.   Regular physical exercise is one of the most important things you can do for your health. Most adults should get at least 150 minutes of moderate-intensity exercise (any activity that increases your heart rate and causes you to sweat) each week. In addition, most adults need muscle-strengthening exercises on 2 or more days a week.   Maintain a healthy weight. The body mass index (BMI) is a screening tool to identify possible weight problems. It provides an estimate of body fat based on height and weight. Your health care provider can find your BMI, and can help you achieve or maintain a healthy weight.For adults 20 years and older:   - A BMI below 18.5 is considered underweight.   - A BMI of 18.5 to 24.9 is normal.   - A BMI of 25 to 29.9 is  considered overweight.   - A BMI of 30 and above is considered obese.   Maintain normal blood lipids and cholesterol levels by exercising and minimizing your intake of trans and saturated fats.  Eat a balanced diet with plenty of fruit and vegetables. Blood tests for lipids and cholesterol should begin at age 20 and be repeated every 5 years minimum.  If your lipid or cholesterol levels are high, you are over 40, or you are at high risk for heart disease, you may need your cholesterol levels checked more frequently.Ongoing high lipid and cholesterol levels should be treated with medicines if diet and exercise are not working.   If you smoke, find out from your health care provider how to quit. If you do not use tobacco, do not start.   Lung cancer screening is recommended for adults aged 55-80 years who are at high risk for developing lung cancer because of a history of smoking. A yearly low-dose CT scan of the lungs is recommended for people who have at least a 30-pack-year history of smoking and are a current smoker or have quit within the past 15 years. A pack year of smoking is smoking an average of 1 pack of cigarettes a day for 1 year (for example: 1 pack a day for 30 years or 2 packs a day for 15 years). Yearly screening should continue until the smoker has stopped smoking for at least 15 years. Yearly screening should be stopped for people who develop a   health problem that would prevent them from having lung cancer treatment.   If you are pregnant, do not drink alcohol. If you are breastfeeding, be very cautious about drinking alcohol. If you are not pregnant and choose to drink alcohol, do not have more than 1 drink per day. One drink is considered to be 12 ounces (355 mL) of beer, 5 ounces (148 mL) of wine, or 1.5 ounces (44 mL) of liquor.   Avoid use of street drugs. Do not share needles with anyone. Ask for help if you need support or instructions about stopping the use of  drugs.   High blood pressure causes heart disease and increases the risk of stroke. Your blood pressure should be checked at least yearly.  Ongoing high blood pressure should be treated with medicines if weight loss and exercise do not work.   If you are 77-2 years old, ask your health care provider if you should take aspirin to prevent strokes.   Diabetes screening involves taking a blood sample to check your fasting blood sugar level. This should be done once every 3 years, after age 39, if you are within normal weight and without risk factors for diabetes. Testing should be considered at a younger age or be carried out more frequently if you are overweight and have at least 1 risk factor for diabetes.   Breast cancer screening is essential preventive care for women. You should practice "breast self-awareness."  This means understanding the normal appearance and feel of your breasts and may include breast self-examination.  Any changes detected, no matter how small, should be reported to a health care provider.  Women in their 63s and 30s should have a clinical breast exam (CBE) by a health care provider as part of a regular health exam every 1 to 3 years.  After age 76, women should have a CBE every year.  Starting at age 51, women should consider having a mammogram (breast X-ray test) every year.  Women who have a family history of breast cancer should talk to their health care provider about genetic screening.  Women at a high risk of breast cancer should talk to their health care providers about having an MRI and a mammogram every year.   -Breast cancer gene (BRCA)-related cancer risk assessment is recommended for women who have family members with BRCA-related cancers. BRCA-related cancers include breast, ovarian, tubal, and peritoneal cancers. Having family members with these cancers may be associated with an increased risk for harmful changes (mutations) in the breast cancer genes BRCA1 and  BRCA2. Results of the assessment will determine the need for genetic counseling and BRCA1 and BRCA2 testing.   The Pap test is a screening test for cervical cancer. A Pap test can show cell changes on the cervix that might become cervical cancer if left untreated. A Pap test is a procedure in which cells are obtained and examined from the lower end of the uterus (cervix).   - Women should have a Pap test starting at age 14.   - Between ages 71 and 13, Pap tests should be repeated every 2 years.   - Beginning at age 42, you should have a Pap test every 3 years as long as the past 3 Pap tests have been normal.   - Some women have medical problems that increase the chance of getting cervical cancer. Talk to your health care provider about these problems. It is especially important to talk to your health care provider if a  new problem develops soon after your last Pap test. In these cases, your health care provider may recommend more frequent screening and Pap tests.   - The above recommendations are the same for women who have or have not gotten the vaccine for human papillomavirus (HPV).   - If you had a hysterectomy for a problem that was not cancer or a condition that could lead to cancer, then you no longer need Pap tests. Even if you no longer need a Pap test, a regular exam is a good idea to make sure no other problems are starting.   - If you are between ages 36 and 66 years, and you have had normal Pap tests going back 10 years, you no longer need Pap tests. Even if you no longer need a Pap test, a regular exam is a good idea to make sure no other problems are starting.   - If you have had past treatment for cervical cancer or a condition that could lead to cancer, you need Pap tests and screening for cancer for at least 20 years after your treatment.   - If Pap tests have been discontinued, risk factors (such as a new sexual partner) need to be reassessed to determine if screening should  be resumed.   - The HPV test is an additional test that may be used for cervical cancer screening. The HPV test looks for the virus that can cause the cell changes on the cervix. The cells collected during the Pap test can be tested for HPV. The HPV test could be used to screen women aged 70 years and older, and should be used in women of any age who have unclear Pap test results. After the age of 67, women should have HPV testing at the same frequency as a Pap test.   Colorectal cancer can be detected and often prevented. Most routine colorectal cancer screening begins at the age of 57 years and continues through age 26 years. However, your health care provider may recommend screening at an earlier age if you have risk factors for colon cancer. On a yearly basis, your health care provider may provide home test kits to check for hidden blood in the stool.  Use of a small camera at the end of a tube, to directly examine the colon (sigmoidoscopy or colonoscopy), can detect the earliest forms of colorectal cancer. Talk to your health care provider about this at age 23, when routine screening begins. Direct exam of the colon should be repeated every 5 -10 years through age 49 years, unless early forms of pre-cancerous polyps or small growths are found.   People who are at an increased risk for hepatitis B should be screened for this virus. You are considered at high risk for hepatitis B if:  -You were born in a country where hepatitis B occurs often. Talk with your health care provider about which countries are considered high risk.  - Your parents were born in a high-risk country and you have not received a shot to protect against hepatitis B (hepatitis B vaccine).  - You have HIV or AIDS.  - You use needles to inject street drugs.  - You live with, or have sex with, someone who has Hepatitis B.  - You get hemodialysis treatment.  - You take certain medicines for conditions like cancer, organ  transplantation, and autoimmune conditions.   Hepatitis C blood testing is recommended for all people born from [birth year] through 1965 and any individual  with known risks for hepatitis C.   Practice safe sex. Use condoms and avoid high-risk sexual practices to reduce the spread of sexually transmitted infections (STIs). STIs include gonorrhea, chlamydia, syphilis, trichomonas, herpes, HPV, and human immunodeficiency virus (HIV). Herpes, HIV, and HPV are viral illnesses that have no cure. They can result in disability, cancer, and death. Sexually active women aged 41 years and younger should be checked for chlamydia. Older women with new or multiple partners should also be tested for chlamydia. Testing for other STIs is recommended if you are sexually active and at increased risk.   Osteoporosis is a disease in which the bones lose minerals and strength with aging. This can result in serious bone fractures or breaks. The risk of osteoporosis can be identified using a bone density scan. Women ages 97 years and over and women at risk for fractures or osteoporosis should discuss screening with their health care providers. Ask your health care provider whether you should take a calcium supplement or vitamin D to There are also several preventive steps women can take to avoid osteoporosis and resulting fractures or to keep osteoporosis from worsening. -->Recommendations include:  Eat a balanced diet high in fruits, vegetables, calcium, and vitamins.  Get enough calcium. The recommended total intake of is 1,200 mg daily; for best absorption, if taking supplements, divide doses into 250-500 mg doses throughout the day. Of the two types of calcium, calcium carbonate is best absorbed when taken with food but calcium citrate can be taken on an empty stomach.  Get enough vitamin D. N[employer] and the [employer] recommend at least 1,000 IU per day for women age 60 and over who are at risk of vitamin D  deficiency. Vitamin D deficiency can be caused by inadequate sun exposure (for example, those who live in [place]).  Avoid alcohol and smoking. Heavy alcohol intake (more than 7 drinks per week) increases the risk of falls and hip fracture and women smokers tend to lose bone more rapidly and have lower bone mass than nonsmokers. Stopping smoking is one of the most important changes women can make to improve their health and decrease risk for disease.  Be physically active every day. Weight-bearing exercise (for example, fast walking, hiking, jogging, and weight training) may strengthen bones or slow the rate of bone loss that comes with aging. Balancing and muscle-strengthening exercises can reduce the risk of falling and fracture.  Consider therapeutic medications. Currently, several types of effective drugs are available. Healthcare providers can recommend the type most appropriate for each woman.  Eliminate environmental factors that may contribute to accidents. Falls cause nearly 90% of all osteoporotic fractures, so reducing this risk is an important bone-health strategy. Measures include ample lighting, removing obstructions to walking, using nonskid rugs on floors, and placing mats and/or grab bars in showers.  Be aware of medication side effects. Some common medicines make bones weaker. These include a type of steroid drug called glucocorticoids used for arthritis and asthma, some antiseizure drugs, certain sleeping pills, treatments for endometriosis, and some cancer drugs. An overactive thyroid gland or using too much thyroid hormone for an underactive thyroid can also be a problem. If you are taking these medicines, talk to your doctor about what you can do to help protect your bones.reduce the rate of osteoporosis.    Menopause can be associated with physical symptoms and risks. Hormone replacement therapy is available to decrease symptoms and risks. You should talk to your  health care provider  about whether hormone replacement therapy is right for you.   Use sunscreen. Apply sunscreen liberally and repeatedly throughout the day. You should seek shade when your shadow is shorter than you. Protect yourself by wearing long sleeves, pants, a wide-brimmed hat, and sunglasses year round, whenever you are outdoors.   Once a month, do a whole body skin exam, using a mirror to look at the skin on your back. Tell your health care provider of new moles, moles that have irregular borders, moles that are larger than a pencil eraser, or moles that have changed in shape or color.   -Stay current with required vaccines (immunizations).   Influenza vaccine. All adults should be immunized every year.  Tetanus, diphtheria, and acellular pertussis (Td, Tdap) vaccine. Pregnant women should receive 1 dose of Tdap vaccine during each pregnancy. The dose should be obtained regardless of the length of time since the last dose. Immunization is preferred during the 27th 36th week of gestation. An adult who has not previously received Tdap or who does not know her vaccine status should receive 1 dose of Tdap. This initial dose should be followed by tetanus and diphtheria toxoids (Td) booster doses every 10 years. Adults with an unknown or incomplete history of completing a 3-dose immunization series with Td-containing vaccines should begin or complete a primary immunization series including a Tdap dose. Adults should receive a Td booster every 10 years.  Varicella vaccine. An adult without evidence of immunity to varicella should receive 2 doses or a second dose if she has previously received 1 dose. Pregnant females who do not have evidence of immunity should receive the first dose after pregnancy. This first dose should be obtained before leaving the health care facility. The second dose should be obtained 4 8 weeks after the first dose.  Human papillomavirus (HPV) vaccine. Females aged 80 26  years who have not received the vaccine previously should obtain the 3-dose series. The vaccine is not recommended for use in pregnant females. However, pregnancy testing is not needed before receiving a dose. If a female is found to be pregnant after receiving a dose, no treatment is needed. In that case, the remaining doses should be delayed until after the pregnancy. Immunization is recommended for any person with an immunocompromised condition through the age of 76 years if she did not get any or all doses earlier. During the 3-dose series, the second dose should be obtained 4 8 weeks after the first dose. The third dose should be obtained 24 weeks after the first dose and 16 weeks after the second dose.  Zoster vaccine. One dose is recommended for adults aged 37 years or older unless certain conditions are present.  Measles, mumps, and rubella (MMR) vaccine. Adults born before [birth year] generally are considered immune to measles and mumps. Adults born in [birth year] or later should have 1 or more doses of MMR vaccine unless there is a contraindication to the vaccine or there is laboratory evidence of immunity to each of the three diseases. A routine second dose of MMR vaccine should be obtained at least 28 days after the first dose for students attending postsecondary schools, health care workers, or international travelers. People who received inactivated measles vaccine or an unknown type of measles vaccine during 1963 1967 should receive 2 doses of MMR vaccine. People who received inactivated mumps vaccine or an unknown type of mumps vaccine before 1979 and are at high risk for mumps infection should consider immunization with 2 doses of  MMR vaccine. For females of childbearing age, rubella immunity should be determined. If there is no evidence of immunity, females who are not pregnant should be vaccinated. If there is no evidence of immunity, females who are pregnant should delay immunization until after pregnancy.  Unvaccinated health care workers born before [birth year] who lack laboratory evidence of measles, mumps, or rubella immunity or laboratory confirmation of disease should consider measles and mumps immunization with 2 doses of MMR vaccine or rubella immunization with 1 dose of MMR vaccine.  Pneumococcal 13-valent conjugate (PCV13) vaccine. When indicated, a person who is uncertain of her immunization history and has no record of immunization should receive the PCV13 vaccine. An adult aged 54 years or older who has certain medical conditions and has not been previously immunized should receive 1 dose of PCV13 vaccine. This PCV13 should be followed with a dose of pneumococcal polysaccharide (PPSV23) vaccine. The PPSV23 vaccine dose should be obtained at least 8 weeks after the dose of PCV13 vaccine. An adult aged 58 years or older who has certain medical conditions and previously received 1 or more doses of PPSV23 vaccine should receive 1 dose of PCV13. The PCV13 vaccine dose should be obtained 1 or more years after the last PPSV23 vaccine dose.  Pneumococcal polysaccharide (PPSV23) vaccine. When PCV13 is also indicated, PCV13 should be obtained first. All adults aged 58 years and older should be immunized. An adult younger than age 65 years who has certain medical conditions should be immunized. Any person who resides in a nursing home or long-term care facility should be immunized. An adult smoker should be immunized. People with an immunocompromised condition and certain other conditions should receive both PCV13 and PPSV23 vaccines. People with human immunodeficiency virus (HIV) infection should be immunized as soon as possible after diagnosis. Immunization during chemotherapy or radiation therapy should be avoided. Routine use of PPSV23 vaccine is not recommended for American Indians, [place] Natives, or people younger than 65 years unless there are medical conditions that require PPSV23 vaccine. When indicated,  people who have unknown immunization and have no record of immunization should receive PPSV23 vaccine. One-time revaccination 5 years after the first dose of PPSV23 is recommended for people aged 70 64 years who have chronic kidney failure, nephrotic syndrome, asplenia, or immunocompromised conditions. People who received 1 2 doses of PPSV23 before age 32 years should receive another dose of PPSV23 vaccine at age 96 years or later if at least 5 years have passed since the previous dose. Doses of PPSV23 are not needed for people immunized with PPSV23 at or after age 55 years.  Meningococcal vaccine. Adults with asplenia or persistent complement component deficiencies should receive 2 doses of quadrivalent meningococcal conjugate (MenACWY-D) vaccine. The doses should be obtained at least 2 months apart. Microbiologists working with certain meningococcal bacteria, [employer] recruits, people at risk during an outbreak, and people who travel to or live in countries with a high rate of meningitis should be immunized. A first-year college student up through age 58 years who is living in a residence hall should receive a dose if she did not receive a dose on or after her 16th birthday. Adults who have certain high-risk conditions should receive one or more doses of vaccine.  Hepatitis A vaccine. Adults who wish to be protected from this disease, have certain high-risk conditions, work with hepatitis A-infected animals, work in hepatitis A research labs, or travel to or work in countries with a high rate of hepatitis A should be  immunized. Adults who were previously unvaccinated and who anticipate close contact with an international adoptee during the first 60 days after arrival in the [place] from a country with a high rate of hepatitis A should be immunized.  Hepatitis B vaccine.  Adults who wish to be protected from this disease, have certain high-risk conditions, may be exposed to blood or other infectious  body fluids, are household contacts or sex partners of hepatitis B positive people, are clients or workers in certain care facilities, or travel to or work in countries with a high rate of hepatitis B should be immunized.  Haemophilus influenzae type b (Hib) vaccine. A previously unvaccinated person with asplenia or sickle cell disease or having a scheduled splenectomy should receive 1 dose of Hib vaccine. Regardless of previous immunization, a recipient of a hematopoietic stem cell transplant should receive a 3-dose series 6 12 months after her successful transplant. Hib vaccine is not recommended for adults with HIV infection.  Preventive Services / Frequency Ages 6 to 39years  Blood pressure check.** / Every 1 to 2 years.  Lipid and cholesterol check.** / Every 5 years beginning at age 39.  Clinical breast exam.** / Every 3 years for women in their 61s and 62s.  BRCA-related cancer risk assessment.** / For women who have family members with a BRCA-related cancer (breast, ovarian, tubal, or peritoneal cancers).  Pap test.** / Every 2 years from ages 47 through 85. Every 3 years starting at age 34 through age 12 or 74 with a history of 3 consecutive normal Pap tests.  HPV screening.** / Every 3 years from ages 46 through ages 43 to 54 with a history of 3 consecutive normal Pap tests.  Hepatitis C blood test.** / For any individual with known risks for hepatitis C.  Skin self-exam. / Monthly.  Influenza vaccine. / Every year.  Tetanus, diphtheria, and acellular pertussis (Tdap, Td) vaccine.** / Consult your health care provider. Pregnant women should receive 1 dose of Tdap vaccine during each pregnancy. 1 dose of Td every 10 years.  Varicella vaccine.** / Consult your health care provider. Pregnant females who do not have evidence of immunity should receive the first dose after pregnancy.  HPV vaccine. / 3 doses over 6 months, if 64 and younger. The vaccine is not recommended for use in  pregnant females. However, pregnancy testing is not needed before receiving a dose.  Measles, mumps, rubella (MMR) vaccine.** / You need at least 1 dose of MMR if you were born in 1957 or later. You may also need a 2nd dose. For females of childbearing age, rubella immunity should be determined. If there is no evidence of immunity, females who are not pregnant should be vaccinated. If there is no evidence of immunity, females who are pregnant should delay immunization until after pregnancy.  Pneumococcal 13-valent conjugate (PCV13) vaccine.** / Consult your health care provider.  Pneumococcal polysaccharide (PPSV23) vaccine.** / 1 to 2 doses if you smoke cigarettes or if you have certain conditions.  Meningococcal vaccine.** / 1 dose if you are age 71 to 37 years and a Market researcher living in a residence hall, or have one of several medical conditions, you need to get vaccinated against meningococcal disease. You may also need additional booster doses.  Hepatitis A vaccine.** / Consult your health care provider.  Hepatitis B vaccine.** / Consult your health care provider.  Haemophilus influenzae type b (Hib) vaccine.** / Consult your health care provider.  Ages 55 to 64years  Blood pressure check.** / Every 1 to 2 years.  Lipid and cholesterol check.** / Every 5 years beginning at age 32 years.  Lung cancer screening. / Every year if you are aged 97 80 years and have a 30-pack-year history of smoking and currently smoke or have quit within the past 15 years. Yearly screening is stopped once you have quit smoking for at least 15 years or develop a health problem that would prevent you from having lung cancer treatment.  Clinical breast exam.** / Every year after age 29 years.  BRCA-related cancer risk assessment.** / For women who have family members with a BRCA-related cancer (breast, ovarian, tubal, or peritoneal cancers).  Mammogram.** / Every year beginning at age 66  years and continuing for as long as you are in good health. Consult with your health care provider.  Pap test.** / Every 3 years starting at age 58 years through age 51 or 16 years with a history of 3 consecutive normal Pap tests.  HPV screening.** / Every 3 years from ages 33 years through ages 76 to 18 years with a history of 3 consecutive normal Pap tests.  Fecal occult blood test (FOBT) of stool. / Every year beginning at age 74 years and continuing until age 56 years. You may not need to do this test if you get a colonoscopy every 10 years.  Flexible sigmoidoscopy or colonoscopy.** / Every 5 years for a flexible sigmoidoscopy or every 10 years for a colonoscopy beginning at age 33 years and continuing until age 49 years.  Hepatitis C blood test.** / For all people born from [birth year] through 1965 and any individual with known risks for hepatitis C.  Skin self-exam. / Monthly.  Influenza vaccine. / Every year.  Tetanus, diphtheria, and acellular pertussis (Tdap/Td) vaccine.** / Consult your health care provider. Pregnant women should receive 1 dose of Tdap vaccine during each pregnancy. 1 dose of Td every 10 years.  Varicella vaccine.** / Consult your health care provider. Pregnant females who do not have evidence of immunity should receive the first dose after pregnancy.  Zoster vaccine.** / 1 dose for adults aged 33 years or older.  Measles, mumps, rubella (MMR) vaccine.** / You need at least 1 dose of MMR if you were born in 1957 or later. You may also need a 2nd dose. For females of childbearing age, rubella immunity should be determined. If there is no evidence of immunity, females who are not pregnant should be vaccinated. If there is no evidence of immunity, females who are pregnant should delay immunization until after pregnancy.  Pneumococcal 13-valent conjugate (PCV13) vaccine.** / Consult your health care provider.  Pneumococcal polysaccharide (PPSV23) vaccine.** / 1 to 2 doses if  you smoke cigarettes or if you have certain conditions.  Meningococcal vaccine.** / Consult your health care provider.  Hepatitis A vaccine.** / Consult your health care provider.  Hepatitis B vaccine.** / Consult your health care provider.  Haemophilus influenzae type b (Hib) vaccine.** / Consult your health care provider.  Ages 71 years and over  Blood pressure check.** / Every 1 to 2 years.  Lipid and cholesterol check.** / Every 5 years beginning at age 88 years.  Lung cancer screening. / Every year if you are aged 58 80 years and have a 30-pack-year history of smoking and currently smoke or have quit within the past 15 years. Yearly screening is stopped once you have quit smoking for at least 15 years or develop a health problem that  would prevent you from having lung cancer treatment.  Clinical breast exam.** / Every year after age 103 years.  BRCA-related cancer risk assessment.** / For women who have family members with a BRCA-related cancer (breast, ovarian, tubal, or peritoneal cancers).  Mammogram.** / Every year beginning at age 36 years and continuing for as long as you are in good health. Consult with your health care provider.  Pap test.** / Every 3 years starting at age 5 years through age 85 or 10 years with 3 consecutive normal Pap tests. Testing can be stopped between 65 and 70 years with 3 consecutive normal Pap tests and no abnormal Pap or HPV tests in the past 10 years.  HPV screening.** / Every 3 years from ages 93 years through ages 70 or 45 years with a history of 3 consecutive normal Pap tests. Testing can be stopped between 65 and 70 years with 3 consecutive normal Pap tests and no abnormal Pap or HPV tests in the past 10 years.  Fecal occult blood test (FOBT) of stool. / Every year beginning at age 8 years and continuing until age 45 years. You may not need to do this test if you get a colonoscopy every 10 years.  Flexible sigmoidoscopy or colonoscopy.** /  Every 5 years for a flexible sigmoidoscopy or every 10 years for a colonoscopy beginning at age 69 years and continuing until age 68 years.  Hepatitis C blood test.** / For all people born from [birth year] through 1965 and any individual with known risks for hepatitis C.  Osteoporosis screening.** / A one-time screening for women ages 7 years and over and women at risk for fractures or osteoporosis.  Skin self-exam. / Monthly.  Influenza vaccine. / Every year.  Tetanus, diphtheria, and acellular pertussis (Tdap/Td) vaccine.** / 1 dose of Td every 10 years.  Varicella vaccine.** / Consult your health care provider.  Zoster vaccine.** / 1 dose for adults aged 5 years or older.  Pneumococcal 13-valent conjugate (PCV13) vaccine.** / Consult your health care provider.  Pneumococcal polysaccharide (PPSV23) vaccine.** / 1 dose for all adults aged 74 years and older.  Meningococcal vaccine.** / Consult your health care provider.  Hepatitis A vaccine.** / Consult your health care provider.  Hepatitis B vaccine.** / Consult your health care provider.  Haemophilus influenzae type b (Hib) vaccine.** / Consult your health care provider. ** Family history and personal history of risk and conditions may change your health care provider's recommendations. Document Released: 08/10/2001 Document Revised: 04/04/2013  [facility] Patient Information 2014 [place], [place].   EXERCISE AND DIET:  We recommended that you start or continue a regular exercise program for good health. Regular exercise means any activity that makes your heart beat faster and makes you sweat.  We recommend exercising at least 30 minutes per day at least 3 days a week, preferably 5.  We also recommend a diet low in fat and sugar / carbohydrates.  Inactivity, poor dietary choices and obesity can cause diabetes, heart attack, stroke, and kidney damage, among others.     ALCOHOL AND SMOKING:  Women should limit their alcohol intake to no  more than 7 drinks/beers/glasses of wine (combined, not each!) per week. Moderation of alcohol intake to this level decreases your risk of breast cancer and liver damage.  ( And of course, no recreational drugs are part of a healthy lifestyle.)  Also, you should not be smoking at all or even being exposed to second hand smoke. Most people know smoking can  cause cancer, and various heart and lung diseases, but did you know it also contributes to weakening of your bones?  Aging of your skin?  Yellowing of your teeth and nails?   CALCIUM AND VITAMIN D:  Adequate intake of calcium and Vitamin D are recommended.  The recommendations for exact amounts of these supplements seem to change often, but generally speaking 600 mg of calcium (either carbonate or citrate) and 800 units of Vitamin D per day seems prudent. Certain women may benefit from higher intake of Vitamin D.  If you are among these women, your doctor will have told you during your visit.     PAP SMEARS:  Pap smears, to check for cervical cancer or precancers,  have traditionally been done yearly, although recent scientific advances have shown that most women can have pap smears less often.  However, every woman still should have a physical exam from her gynecologist or primary care physician every year. It will include a breast check, inspection of the vulva and vagina to check for abnormal growths or skin changes, a visual exam of the cervix, and then an exam to evaluate the size and shape of the uterus and ovaries.  And after 23 years of age, a rectal exam is indicated to check for rectal cancers. We will also provide age appropriate advice regarding health maintenance, like when you should have certain vaccines, screening for sexually transmitted diseases, bone density testing, colonoscopy, mammograms, etc.    MAMMOGRAMS:  All women over 83 years old should have a yearly mammogram. Many facilities now offer a "3D" mammogram, which may cost  around $50 extra out of pocket. If possible,  we recommend you accept the option to have the 3D mammogram performed.  It both reduces the number of women who will be called back for extra views which then turn out to be normal, and it is better than the routine mammogram at detecting truly abnormal areas.     COLONOSCOPY:  Colonoscopy to screen for colon cancer is recommended for all women at age 57.  We know, you hate the idea of the prep.  We agree, BUT, having colon cancer and not knowing it is worse!!  Colon cancer so often starts as a polyp that can be seen and removed at colonscopy, which can quite literally save your life!  And if your first colonoscopy is normal and you have no family history of colon cancer, most women don't have to have it again for 10 years.  Once every ten years, you can do something that may end up saving your life, right?  We will be happy to help you get it scheduled when you are ready.  Be sure to check your insurance coverage so you understand how much it will cost.  It may be covered as a preventative service at no cost, but you should check your particular policy.    Continue all medications as directed. For anxiety- please start Fluoxetine (Prozac) 84m- 1/2 tab once daily for one week, then increase to one full tablet-hold at this dose. Continue with therapist. Try [employer] Natural Deodorant to address excessive sweating. Increase water intake, strive for at least 75 ounces/day.   Follow Heart Healthy diet Increase regular exercise.  Recommend at least 30 minutes daily, 5 days per week of walking, jogging, biking, swimming, YouTube/Pinterest workout videos. Please schedule TeleMedicine appt in 4 weeks. Continue to social distance and when out in public, wear a mask. GREAT TO SEE YOU!

## 2018-12-19 ENCOUNTER — Other Ambulatory Visit: Payer: Self-pay

## 2018-12-19 ENCOUNTER — Other Ambulatory Visit: Payer: No Typology Code available for payment source

## 2018-12-20 ENCOUNTER — Other Ambulatory Visit (INDEPENDENT_AMBULATORY_CARE_PROVIDER_SITE_OTHER): Payer: No Typology Code available for payment source

## 2018-12-20 ENCOUNTER — Telehealth: Payer: Self-pay | Admitting: Adult Health

## 2018-12-20 DIAGNOSIS — R195 Other fecal abnormalities: Secondary | ICD-10-CM

## 2018-12-20 DIAGNOSIS — R7989 Other specified abnormal findings of blood chemistry: Secondary | ICD-10-CM | POA: Diagnosis not present

## 2018-12-20 LAB — IFOBT (OCCULT BLOOD)
IFOBT: NEGATIVE
IFOBT: NEGATIVE
IFOBT: NEGATIVE

## 2018-12-20 NOTE — Telephone Encounter (Signed)
Please review.  T. Clenton Esper, CMA °

## 2018-12-20 NOTE — Telephone Encounter (Signed)
Patient called says was told to call in Blood Pressure reading for 2 weeks :   Date              Readings                                   Heart Rate  6/5 ----11am     129/84         80     6pm    126/76         84   6/6   11am  133/92        97        Pm  118/76        83  6/7 am    106/71        67   Pm      133/92        90  6/8      Am     124/84                   72            Pm           121/77        99  6/9      Am    130/77        74            Pm           129/90                      87  6/10     Am         129/79                   80  Pm         125/79                                 99  6/11     Am         124/76        81   Pm        119/79         84  6/12 Am             128/82        89  Pm        126/78         81  6/13  Am        128/72        91              Pm          125/82       79   6/14      Am        126/75                 90  Pm              130/ 80       81  6/15 Am               129/80      100  Pm  Patient  forgot to do  6/16    Am            124/ 80     76            Pm                 129/79    80  6/17       Am            112/73     76               Pm    124/82               80  6/18       Am      121/ 81    91                Pm   134/ 74    81  6/19      Am          125/81   87               Pm      123/81    85  6/20   Am        112/72    91     Pm     127/67   85  ---All reading per pt's BP cuff  --forwarding message to medical assistant.  -Dion Body

## 2018-12-20 NOTE — Telephone Encounter (Signed)
MyChart message sent to pt.  T. Nelson, CMA 

## 2018-12-20 NOTE — Telephone Encounter (Signed)
Good Afternoon Kristin Orozco, Can you please call Kristin Orozco and share- BP and HR is much improved! Please encourage her to remain well hydrate and follow a DASH diet. Thanks! Valetta Fuller

## 2018-12-27 MED FILL — NORETHINDRONE 0.35 MG TAB: 0.35 | 84 days supply | Qty: 84 | Fill #2

## 2019-01-10 NOTE — Progress Notes (Deleted)
Virtual Visit via Telephone Note  I connected with Kristin Orozco on 01/11/2019 at  3:00 PM EDT by telephone and verified that I am speaking with the correct person using two identifiers.  Location: Patient: Home  Provider: In Clinic   I discussed the limitations, risks, security and privacy concerns of performing an evaluation and management service by telephone and the availability of in person appointments. I also discussed with the patient that there may be a patient responsible charge related to this service. The patient expressed understanding and agreed to proceed.   History of Present Illness: For anxiety- please start Fluoxetine (Prozac) 20mg - 1/2 tab once daily for one week, then increase to one full tablet-hold at this dose. Continue with therapist. TeleMedicine f/u in 4 weeks  Patient Care Team    Relationship Specialty Notifications Start End  Esaw Grandchild, NP PCP - General Family Medicine  06/08/18     Patient Active Problem List   Diagnosis Date Noted  . GAD (generalized anxiety disorder) 12/14/2018  . Lymphadenopathy 11/30/2018  . Elevated BP without diagnosis of hypertension 11/30/2018  . Loose stools 11/30/2018  . Healthcare maintenance 07/06/2018  . Thyroid nodule 07/06/2018  . Stress 07/06/2018  . Loss of consciousness ([place]) 12/14/2013     Past Medical History:  Diagnosis Date  . Anxiety   . Syncope   . Tachycardia   . Thyroid disease    Hx of thyroid nodule--followed by PCP     No past surgical history on file.   Family History  Problem Relation Age of Onset  . Hypertension Mother   . Lupus Maternal Uncle   . Multiple sclerosis Maternal Grandmother   . Cancer Maternal Grandmother        breast and bone  . Diabetes Maternal Grandmother   . Breast cancer Maternal Grandmother 63       mets to bone  . Thyroid disease Maternal Grandmother        hypothyroid  . Heart attack Maternal Grandfather      Social History   Substance and  Sexual Activity  Drug Use No     Social History   Substance and Sexual Activity  Alcohol Use Yes  . Alcohol/week: 3.0 standard drinks  . Types: 3 Glasses of wine per week   Comment: rarely     Social History   Tobacco Use  Smoking Status Current Every Day Smoker  . Packs/day: 0.25  . Years: 6.00  . Pack years: 1.50  . Types: Cigarettes  Smokeless Tobacco Never Used     Outpatient Encounter Medications as of 01/11/2019  Medication Sig  . cetirizine (ZYRTEC) 10 MG tablet Take 1 tablet (10 mg total) by mouth daily.  . cyclobenzaprine (FLEXERIL) 5 MG tablet Take 1 tablet (5 mg total) by mouth 3 (three) times daily as needed for muscle spasms.  . DENTA 5000 PLUS 1.1 % CREA dental cream USE ONCE OR TWICE PER DAY. AT LEAST ONCE BEFORE BEDTIME  . FLUoxetine (PROZAC) 20 MG tablet 1/2 tablet daily for one week, then increase to full tablet.  . fluticasone (FLONASE) 50 MCG/ACT nasal spray Place 2 sprays into both nostrils daily.  . naproxen (NAPROSYN) 500 MG tablet Take 1 tablet (500 mg total) by mouth 2 (two) times daily with a meal.  . norethindrone (MICRONOR,CAMILA,ERRIN) 0.35 MG tablet Take 1 tablet (0.35 mg total) by mouth daily.  . ondansetron (ZOFRAN) 4 MG tablet Take 1 tablet (4 mg total) by mouth every 8 (eight)  hours as needed for nausea or vomiting.  . pseudoephedrine (SUDAFED) 60 MG tablet Take 1 tablet (60 mg total) by mouth every 8 (eight) hours as needed for congestion.   No facility-administered encounter medications on file as of 01/11/2019.     Allergies: Patient has no known allergies.  There is no height or weight on file to calculate BMI.  There were no vitals taken for this visit. Review of Systems: General:   Denies fever, chills, unexplained weight loss.  Optho/Auditory:   Denies visual changes, blurred vision/LOV Respiratory:   Denies SOB, DOE more than baseline levels.  Cardiovascular:   Denies chest pain, palpitations, new onset peripheral edema   Gastrointestinal:   Denies nausea, vomiting, diarrhea.  Genitourinary: Denies dysuria, freq/ urgency, flank pain or discharge from genitals.  Endocrine:     Denies hot or cold intolerance, polyuria, polydipsia. Musculoskeletal:   Denies unexplained myalgias, joint swelling, unexplained arthralgias, gait problems.  Skin:  Denies rash, suspicious lesions Neurological:     Denies dizziness, unexplained weakness, numbness  Psychiatric/Behavioral:   Denies mood changes, suicidal or homicidal ideations, hallucinations This patient does not have sx concerning for COVID-19 Infection (ie; fever, chills, cough, new or worsening shortness of breath).   Observations/Objective:   Assessment and Plan:   Follow Up Instructions:    I discussed the assessment and treatment plan with the patient. The patient was provided an opportunity to ask questions and all were answered. The patient agreed with the plan and demonstrated an understanding of the instructions.   The patient was advised to call back or seek an in-person evaluation if the symptoms worsen or if the condition fails to improve as anticipated.  I provided *** minutes of non-face-to-face time during this encounter.   Julaine FusiKaty D Taressa Rauh, NP

## 2019-01-11 ENCOUNTER — Ambulatory Visit: Payer: No Typology Code available for payment source | Admitting: Adult Health

## 2019-01-11 ENCOUNTER — Other Ambulatory Visit: Payer: Self-pay

## 2019-01-15 ENCOUNTER — Encounter: Payer: Self-pay | Admitting: Adult Health

## 2019-01-17 NOTE — Progress Notes (Signed)
Virtual Visit via Telephone Note  I connected with Kristin Orozco on 01/18/2019 at  3:15 PM EDT by telephone and verified that I am speaking with the correct person using two identifiers.  Location: Patient: Home  Provider: In Clinic   I discussed the limitations, risks, security and privacy concerns of performing an evaluation and management service by telephone and the availability of in person appointments. I also discussed with the patient that there may be a patient responsible charge related to this service. The patient expressed understanding and agreed to proceed.   History of Present Illness: 12/14/2018 OV: Kristin Orozco presents for CPE She continues to experience daily loose stools (1-3 stools/day). She denies hematuria/hematochezia BP and HR quite elevated at initial check She reports syncopal event 2015  2D Echo- - Left ventricle: The cavity size was normal. Systolic function was normal. Wall motion was normal; there were no regional wall motion abnormalities.  She denies CP/dyspnea/dizziness/palpitations.  She reports increase in general anxiety and stress r/t family issues, work, and "COVID-19 situation". She has continued with her therapist, every 3-4 weeks. She denies SI/HI She continues to smoke cigarettes/day Denies ETOH use She has never been evaluated for ADHD, but reports her mother and her brother both dx'd with ADHD  Reviewed recent R Axillary US Results- IMPRESSION: No suspicious findings in the right axilla. Negative for lymphadenopathy.  A small area of axillary breast parenchyma is imaged, a normal variant. This can be associated with intermittent axillary fullness and tenderness, usually cyclical.  RECOMMENDATION: Screening mammogram at age 23 unless there are persistent or intervening clinical concerns. (Code:[id number]-B-40A)  01/18/2019 OV: Kristin Orozco is calling in today for f/u: GAD She was started on Fluoxetine, when she titrated up to  full 20mg  tablet QD she reports lethargy and states "it was difficult to get out of bed". On the 1/2 tablet (total of 10mg ) she reports feeling "more relaxed, carefree, and MUCH less stress". She denies SI/HI She reports resolution of "heart fluttering". She has increased water intake- >100 oz/day She has resumed playing softball twice weekly and batting cage practice every other day. She continues to reduce tobacco use 0-3 cigarettes/day- YOU CAN DO IT! She continues to abstain from tobacco She reports restful, sound sleep  Patient Care Team    Relationship Specialty Notifications Start End  Julaine Fusianford, Adoni Greenough D, NP PCP - General Family Medicine  06/08/18     Patient Active Problem List   Diagnosis Date Noted  . GAD (generalized anxiety disorder) 12/14/2018  . Lymphadenopathy 11/30/2018  . Elevated BP without diagnosis of hypertension 11/30/2018  . Loose stools 11/30/2018  . Healthcare maintenance 07/06/2018  . Thyroid nodule 07/06/2018  . Stress 07/06/2018  . Loss of consciousness ([facility]) 12/14/2013     Past Medical History:  Diagnosis Date  . Anxiety   . Syncope   . Tachycardia   . Thyroid disease    Hx of thyroid nodule--followed by PCP     History reviewed. No pertinent surgical history.   Family History  Problem Relation Age of Onset  . Hypertension Mother   . Lupus Maternal Uncle   . Multiple sclerosis Maternal Grandmother   . Cancer Maternal Grandmother        breast and bone  . Diabetes Maternal Grandmother   . Breast cancer Maternal Grandmother 63       mets to bone  . Thyroid disease Maternal Grandmother        hypothyroid  . Heart attack Maternal Grandfather  Social History   Substance and Sexual Activity  Drug Use No     Social History   Substance and Sexual Activity  Alcohol Use Yes  . Alcohol/week: 3.0 standard drinks  . Types: 3 Glasses of wine per week   Comment: rarely     Social History   Tobacco Use  Smoking Status Current  Every Day Smoker  . Packs/day: 0.25  . Years: 6.00  . Pack years: 1.50  . Types: Cigarettes  Smokeless Tobacco Never Used     Outpatient Encounter Medications as of 01/18/2019  Medication Sig  . cetirizine (ZYRTEC) 10 MG tablet Take 1 tablet (10 mg total) by mouth daily.  . cyclobenzaprine (FLEXERIL) 5 MG tablet Take 1 tablet (5 mg total) by mouth 3 (three) times daily as needed for muscle spasms.  . DENTA 5000 PLUS 1.1 % CREA dental cream USE ONCE OR TWICE PER DAY. AT LEAST ONCE BEFORE BEDTIME  . FLUoxetine (PROZAC) 10 MG tablet 1 tablet daily.  . fluticasone (FLONASE) 50 MCG/ACT nasal spray Place 2 sprays into both nostrils daily.  . naproxen (NAPROSYN) 500 MG tablet Take 1 tablet (500 mg total) by mouth 2 (two) times daily with a meal.  . norethindrone (MICRONOR,CAMILA,ERRIN) 0.35 MG tablet Take 1 tablet (0.35 mg total) by mouth daily.  . ondansetron (ZOFRAN) 4 MG tablet Take 1 tablet (4 mg total) by mouth every 8 (eight) hours as needed for nausea or vomiting.  . pseudoephedrine (SUDAFED) 60 MG tablet Take 1 tablet (60 mg total) by mouth every 8 (eight) hours as needed for congestion.  . [DISCONTINUED] FLUoxetine (PROZAC) 10 MG tablet 1 tablet daily for one week.  . [DISCONTINUED] FLUoxetine (PROZAC) 20 MG tablet 1/2 tablet daily for one week, then increase to full tablet. (Patient taking differently: Take 10 mg by mouth daily. 1/2 tablet daily for one week, then increase to full tablet.)   No facility-administered encounter medications on file as of 01/18/2019.     Allergies: Patient has no known allergies.  Body mass index is 27.46 kg/m.  Blood pressure 124/79, pulse 81, temperature 98.7 F (37.1 C), temperature source Oral, height 5\' 3"  (1.6 m), weight 155 lb (70.3 kg), last menstrual period 12/29/2018.   Observations/Objective: No acute distress noted during the telephone conversation  Assessment and Plan: Remain on Fluoxetine 10mg  QD Remain well hydrated, follow heart  healthy diet Remain as active as possible/ Continue to reduce tobacco use- you can do it! Continue to social distance and wear a mask when in public.  Follow Up Instructions: PRN   I discussed the assessment and treatment plan with the patient. The patient was provided an opportunity to ask questions and all were answered. The patient agreed with the plan and demonstrated an understanding of the instructions.   The patient was advised to call back or seek an in-person evaluation if the symptoms worsen or if the condition fails to improve as anticipated.  I provided 8 minutes of non-face-to-face time during this encounter.   Esaw Grandchild, NP

## 2019-01-18 ENCOUNTER — Ambulatory Visit (INDEPENDENT_AMBULATORY_CARE_PROVIDER_SITE_OTHER): Payer: No Typology Code available for payment source | Admitting: Adult Health

## 2019-01-18 ENCOUNTER — Encounter: Payer: Self-pay | Admitting: Adult Health

## 2019-01-18 ENCOUNTER — Other Ambulatory Visit: Payer: Self-pay

## 2019-01-18 DIAGNOSIS — F411 Generalized anxiety disorder: Secondary | ICD-10-CM | POA: Diagnosis not present

## 2019-01-18 MED ORDER — FLUOXETINE HCL 10 MG PO TABS: ORAL_TABLET | ORAL | 1 refills | Status: DC

## 2019-01-18 NOTE — Assessment & Plan Note (Signed)
Assessment and Plan: Remain on Fluoxetine 10mg  QD Remain well hydrated, follow heart healthy diet Remain as active as possible/ Continue to reduce tobacco use- you can do it! Continue to social distance and wear a mask when in public.  Follow Up Instructions: PRN   I discussed the assessment and treatment plan with the patient. The patient was provided an opportunity to ask questions and all were answered. The patient agreed with the plan and demonstrated an understanding of the instructions.   The patient was advised to call back or seek an in-person evaluation if the symptoms worsen or if the condition fails to improve as anticipated.  I provided 8 minutes of non-face-to-face time during this encounter.

## 2019-01-26 ENCOUNTER — Telehealth: Payer: Self-pay | Admitting: Adult Health

## 2019-01-26 NOTE — Telephone Encounter (Signed)
Patient was tested on 01/18/2019 at approximately 1:30pm at [address], [place], [place] [address] chart doesnt reflect, please advise

## 2019-01-26 NOTE — Telephone Encounter (Signed)
Call returned to pt.  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  Also, left message to call back to [phone], if further questions.

## 2019-01-29 ENCOUNTER — Telehealth: Payer: Self-pay | Admitting: Adult Health

## 2019-01-29 NOTE — Telephone Encounter (Signed)
Copied from [place] [id number]. Topic: Quick Communication - See Telephone Encounter >> Jan 26, 2019  6:30 PM Blase Mess A wrote: CRM for notification. See Telephone encounter for: 01/26/19.  Patient is calling she received Carol's message. [facility] does not have her COVID results. Directed patient to [facility]. Had Morley double check results.

## 2019-04-30 ENCOUNTER — Telehealth: Payer: Self-pay | Admitting: Adult Health

## 2019-04-30 NOTE — Telephone Encounter (Signed)
Advised pt to follow all instructions from [facility] and await COVID test results.  Pt states that she feels she needs something for cough as it is keeping her await at night and she has started coughing up phlegm that has some blood mixed in.  Advised pt to keep appt with Katy on 05/01/2019.  Pt expressed understanding and is agreeable.  [name], CMA

## 2019-04-30 NOTE — Telephone Encounter (Signed)
Patient called states she is having severe Cold Symptoms & Is being sent for COVID testing right now by [facility]. -- ( advised her we may need to wait for Results)--Pt wanted to see provider for poss medication,advised her I would forward her request to medical asst . --Also set pt up for an Acute/ Telehealth appt 11/3 but told her it maybe cancelled due to her results not being in for [place].  --Forwarding message to med asst to contact pt with advice @ [phone].   --glh

## 2019-05-01 ENCOUNTER — Ambulatory Visit (INDEPENDENT_AMBULATORY_CARE_PROVIDER_SITE_OTHER): Payer: No Typology Code available for payment source | Admitting: Adult Health

## 2019-05-01 ENCOUNTER — Encounter: Payer: Self-pay | Admitting: Adult Health

## 2019-05-01 ENCOUNTER — Other Ambulatory Visit: Payer: Self-pay

## 2019-05-01 DIAGNOSIS — H6982 Other specified disorders of Eustachian tube, left ear: Secondary | ICD-10-CM | POA: Diagnosis not present

## 2019-05-01 DIAGNOSIS — R059 Cough, unspecified: Secondary | ICD-10-CM | POA: Insufficient documentation

## 2019-05-01 DIAGNOSIS — R05 Cough: Secondary | ICD-10-CM | POA: Diagnosis not present

## 2019-05-01 MED ORDER — HYDROCOD POLST-CPM POLST ER 10-8 MG/5ML PO SUER: 5.0000 mL | Freq: Two times a day (BID) | ORAL | 0 refills | Status: DC | PRN

## 2019-05-01 MED ORDER — FLUTICASONE PROPIONATE 50 MCG/ACT NA SUSP: 2.0000 | Freq: Every day | NASAL | 0 refills | Status: DC

## 2019-05-01 NOTE — Assessment & Plan Note (Signed)
Assessment and Plan: Continue to push fluids, rest. Alternate OTC Acetaminophen/Ibuprofen. Remain off tobacco- you can do it! Refilled Flonase. [place] Controlled Substance Database reviewed- no aberrancies noted. Tussionex sent in to reduce cough, esp at night. Follow CDC quarantine guidelines. Discussed Red Flag sx's and when to seek immediate medical care- pot verbalized understanding/agreement. If sx's persist for another week (which will meet min 10 days)- then call clinic and I will send in Kristin Orozco.  Follow Up Instructions: PRN   I discussed the assessment and treatment plan with the patient. The patient was provided an opportunity to ask questions and all were answered. The patient agreed with the plan and demonstrated an understanding of the instructions.   The patient was advised to call back or seek an in-person evaluation if the symptoms worsen or if the condition fails to improve as anticipated.

## 2019-05-01 NOTE — Progress Notes (Signed)
Virtual Visit via Telephone Note  I connected with Kristin Orozco on 05/01/19 at 10:30 AM EST by telephone and verified that I am speaking with the correct person using two identifiers.  Location: Patient: Home Provider: In Clinic   I discussed the limitations, risks, security and privacy concerns of performing an evaluation and management service by telephone and the availability of in person appointments. I also discussed with the patient that there may be a patient responsible charge related to this service. The patient expressed understanding and agreed to proceed.   History of Present Illness: Ms. Kristin Orozco calls in today with acute URI sx's. She called out sick with [facility] yesterday-  SARS-CoV-2 testing 04/30/2019 around 1000- results not known yet. She has no known exposure to [facility] Virus. She reports 100% compliance with PPE at work and masking while in public. Sx's started Friday 04/27/2019, which are- Sore throat, 6/10. Productive cough- yellow mucus with blood streaks- sig cough at night that is disrupting sleep. Body aches. Night sweats. Frontal HA - intermittent, "irritating" pain, rated 7/10.  Nasal drainage- clear drainage. Dizziness.  She denies fever- temp consistently in 30f range  She denies N/V/D/C. She received influenza vaccination 04/06/2019. She has not used tobacco in 6 days- GREAT!  She has used OTC Sudafed, Ibuprofen, OTC Antihistamine, Flonase, pushing fluids, increasing rest.  Patient Care Team    Relationship Specialty Notifications Start End  Julaine Fusi, NP PCP - General Family Medicine  06/08/18     Patient Active Problem List   Diagnosis Date Noted  . GAD (generalized anxiety disorder) 12/14/2018  . Lymphadenopathy 11/30/2018  . Elevated BP without diagnosis of hypertension 11/30/2018  . Loose stools 11/30/2018  . Healthcare maintenance 07/06/2018  . Thyroid nodule 07/06/2018  . Stress 07/06/2018  . Loss of  consciousness (HCC) 12/14/2013     Past Medical History:  Diagnosis Date  . Anxiety   . Syncope   . Tachycardia   . Thyroid disease    Hx of thyroid nodule--followed by PCP     History reviewed. No pertinent surgical history.   Family History  Problem Relation Age of Onset  . Hypertension Mother   . Lupus Maternal Uncle   . Multiple sclerosis Maternal Grandmother   . Cancer Maternal Grandmother        breast and bone  . Diabetes Maternal Grandmother   . Breast cancer Maternal Grandmother 63       mets to bone  . Thyroid disease Maternal Grandmother        hypothyroid  . Heart attack Maternal Grandfather      Social History   Substance and Sexual Activity  Drug Use No     Social History   Substance and Sexual Activity  Alcohol Use Yes  . Alcohol/week: 3.0 standard drinks  . Types: 3 Glasses of wine per week   Comment: rarely     Social History   Tobacco Use  Smoking Status Current Every Day Smoker  . Packs/day: 0.25  . Years: 6.00  . Pack years: 1.50  . Types: Cigarettes  Smokeless Tobacco Never Used     Outpatient Encounter Medications as of 05/01/2019  Medication Sig  . cyclobenzaprine (FLEXERIL) 5 MG tablet Take 1 tablet (5 mg total) by mouth 3 (three) times daily as needed for muscle spasms.  . DENTA 5000 PLUS 1.1 % CREA dental cream USE ONCE OR TWICE PER DAY. AT LEAST ONCE BEFORE BEDTIME  . FLUoxetine (PROZAC) 10  MG tablet 1 tablet daily.  . fluticasone (FLONASE) 50 MCG/ACT nasal spray Place 2 sprays into both nostrils daily.  . naproxen (NAPROSYN) 500 MG tablet Take 1 tablet (500 mg total) by mouth 2 (two) times daily with a meal.  . norethindrone (MICRONOR,CAMILA,ERRIN) 0.35 MG tablet Take 1 tablet (0.35 mg total) by mouth daily.  . ondansetron (ZOFRAN) 4 MG tablet Take 1 tablet (4 mg total) by mouth every 8 (eight) hours as needed for nausea or vomiting.  . pseudoephedrine (SUDAFED) 60 MG tablet Take 1 tablet (60 mg total) by mouth every 8  (eight) hours as needed for congestion.  . vitamin C (ASCORBIC ACID) 500 MG tablet Take 500 mg by mouth daily.  . [DISCONTINUED] cetirizine (ZYRTEC) 10 MG tablet Take 1 tablet (10 mg total) by mouth daily.   No facility-administered encounter medications on file as of 05/01/2019.     Allergies: Patient has no known allergies.  Body mass index is 26.75 kg/m.  Temperature 98.2 F (36.8 C), temperature source Oral, height 5\' 3"  (1.6 m), weight 151 lb (68.5 kg), last menstrual period 04/22/2019. Review of Systems: General:   Denies fever , Chills +, Malaise +,  Denies unexplained weight loss.  Optho/Auditory:   Denies visual changes, blurred vision/LOV Respiratory:  Productive cough+, Nasal Drainage +  Denies SOB, DOE more than baseline levels.  Cardiovascular:   Denies chest pain, palpitations, new onset peripheral edema  Gastrointestinal:   Denies nausea, vomiting, diarrhea.  Genitourinary: Denies dysuria, freq/ urgency, flank pain or discharge from genitals.  Endocrine:     Denies hot or cold intolerance, polyuria, polydipsia. Musculoskeletal:   Denies unexplained myalgias, joint swelling, unexplained arthralgias, gait problems.  Skin:  Denies rash, suspicious lesions Neurological:   HA +, Dizziness +, unexplained weakness, numbness  Psychiatric/Behavioral:   Denies mood changes, suicidal or homicidal ideations, hallucinations    Observations/Objective: No acute distress noted during telephone conversation.  Assessment and Plan: Continue to push fluids, rest. Alternate OTC Acetaminophen/Ibuprofen. Remain off tobacco- you can do it! Refilled Flonase. [place] Controlled Substance Database reviewed- no aberrancies noted. Tussionex sent in to reduce cough, esp at night. Follow CDC quarantine guidelines. Discussed Red Flag sx's and when to seek immediate medical care- pot verbalized understanding/agreement. If sx's persist for another week (which will meet min 10 days)-  then call clinic and I will send in [place].  Follow Up Instructions: PRN   I discussed the assessment and treatment plan with the patient. The patient was provided an opportunity to ask questions and all were answered. The patient agreed with the plan and demonstrated an understanding of the instructions.   The patient was advised to call back or seek an in-person evaluation if the symptoms worsen or if the condition fails to improve as anticipated.  I provided 15 minutes of non-face-to-face time during this encounter.   Esaw Grandchild, NP

## 2019-05-02 ENCOUNTER — Encounter: Payer: Self-pay | Admitting: Adult Health

## 2019-05-03 ENCOUNTER — Telehealth: Payer: Self-pay | Admitting: Adult Health

## 2019-05-03 NOTE — Telephone Encounter (Signed)
Okay to draft work excuse. RTW Monday 05/07/2019 Thanks! Valetta Fuller

## 2019-05-03 NOTE — Telephone Encounter (Signed)
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  Pt expressed understanding, but requests a work note to remain out of work d/t her persistent cough and yellow green nasal drainage.  Please advise if we may provide work note.  [name], CMA

## 2019-05-03 NOTE — Telephone Encounter (Signed)
Patient called states her COVID test results were (Neg) but she is still experiencing cough, congestion, runny nose & other cold symptoms .  --Per patient PCP says she would call in antibiotics if test results are neg& cold symptoms remain.  --- Pt also says must return to work tomorrow & feels (should not) because of symptoms (wishes advice).  ---Forwarding request to medical asst for review w/provider & to contact pt @ [phone].  --glh

## 2019-05-04 NOTE — Telephone Encounter (Signed)
Work excuse written for pt.  Pt informed by Annabell Sabal.  [name], CMA

## 2019-06-07 ENCOUNTER — Encounter: Payer: Self-pay | Admitting: Obstetrics and Gynecology

## 2019-06-07 ENCOUNTER — Telehealth: Payer: Self-pay | Admitting: Obstetrics and Gynecology

## 2019-06-07 NOTE — Telephone Encounter (Signed)
Left message to call Tanija Germani, RN at [facility] [phone].   

## 2019-06-07 NOTE — Telephone Encounter (Signed)
Left message to call Issabelle Mcraney, RN at [facility] [phone].   

## 2019-06-07 NOTE — Telephone Encounter (Signed)
Patient returned call

## 2019-06-07 NOTE — Telephone Encounter (Signed)
Patient sent the following message through [place]. Routing to triage to assist patient with request.  [place], [place] [facility]  Phone Number: [phone]  Hi   I have been struggling severely with acne and I am at a loss on what to do nothing is helping. I've tried multiple creams and methods...   Thank you!

## 2019-06-07 NOTE — Telephone Encounter (Signed)
Spoke with patient. Patient is requesting to discuss treatment options for acne. Currently on POP, has not seen a dermatologist in the past. Has stopped smoking now for about 6wks, would like to discuss alternative contraceptives. Requesting MyChart consult. Scheduled for MyChart visit on 12/15 at 4pm with Dr. Quincy Simmonds.   Routing to provider for final review. Patient is agreeable to disposition. Will close encounter.

## 2019-06-12 ENCOUNTER — Telehealth (INDEPENDENT_AMBULATORY_CARE_PROVIDER_SITE_OTHER): Payer: No Typology Code available for payment source | Admitting: Obstetrics and Gynecology

## 2019-06-12 ENCOUNTER — Encounter: Payer: Self-pay | Admitting: Obstetrics and Gynecology

## 2019-06-12 ENCOUNTER — Other Ambulatory Visit: Payer: Self-pay

## 2019-06-12 DIAGNOSIS — L709 Acne, unspecified: Secondary | ICD-10-CM | POA: Diagnosis not present

## 2019-06-12 DIAGNOSIS — Z3009 Encounter for other general counseling and advice on contraception: Secondary | ICD-10-CM | POA: Diagnosis not present

## 2019-06-12 NOTE — Progress Notes (Signed)
GYNECOLOGY  VISIT   HPI: 23 y.o.   Single  Caucasian  female   [id number] with LMP 05-14-19.   here for consult to discuss treatment options for acne.   She gives permission for this visit, scheduled by Carmelina Dane, RN. I am in my office.  She is at home in the sunroom, alone.  Started 4:20 pm. Ended 4:41.  Quit tobacco 6 weeks ago.   Having acne when she switched to the Micronor.  Mask for pandemic is not helping either.  Before she went on regular birth control she did not have acne issues.  She has not seen a dermatologist.   Her BP 127/79 this am.  She has done BP pressure monitoring and she was almost started on medication by her PCP.   She is on Prozac since June, 2020. This is treating her anxiety.  Sleeping better.  No heart fluttering.    She has some vaginal discharge and odor.  She tried Monistat and this did not help.   She states has a fever and she was potentially exposed to Covid.  Her boyfriend was exposed at work.  She is now going to be tested.   GYNECOLOGIC HISTORY: LMP: 05-14-19 Contraception: Micronor Menopausal hormone therapy:  n/a Last mammogram:  n/a Last pap smear: 08/18/18 Negative        OB History    Gravida  0   Para  0   Term  0   Preterm  0   AB  0   Living  0     SAB  0   TAB  0   Ectopic  0   Multiple  0   Live Births  0              Patient Active Problem List   Diagnosis Date Noted  . Cough in adult 05/01/2019  . GAD (generalized anxiety disorder) 12/14/2018  . Lymphadenopathy 11/30/2018  . Elevated BP without diagnosis of hypertension 11/30/2018  . Loose stools 11/30/2018  . Healthcare maintenance 07/06/2018  . Thyroid nodule 07/06/2018  . Stress 07/06/2018  . Loss of consciousness (HCC) 12/14/2013    Past Medical History:  Diagnosis Date  . Anxiety   . Syncope   . Tachycardia   . Thyroid disease    Hx of thyroid nodule--followed by PCP    History reviewed. No pertinent surgical  history.  Current Outpatient Medications  Medication Sig Dispense Refill  . cyclobenzaprine (FLEXERIL) 5 MG tablet Take 1 tablet (5 mg total) by mouth 3 (three) times daily as needed for muscle spasms. 15 tablet 0  . DENTA 5000 PLUS 1.1 % CREA dental cream USE ONCE OR TWICE PER DAY. AT LEAST ONCE BEFORE BEDTIME 1 Tube 0  . FLUoxetine (PROZAC) 10 MG tablet 1 tablet daily. 90 tablet 1  . fluticasone (FLONASE) 50 MCG/ACT nasal spray Place 2 sprays into both nostrils daily. 16 g 0  . naproxen (NAPROSYN) 500 MG tablet Take 1 tablet (500 mg total) by mouth 2 (two) times daily with a meal. 20 tablet 0  . norethindrone (MICRONOR,CAMILA,ERRIN) 0.35 MG tablet Take 1 tablet (0.35 mg total) by mouth daily. 1 Package 11  . ondansetron (ZOFRAN) 4 MG tablet Take 1 tablet (4 mg total) by mouth every 8 (eight) hours as needed for nausea or vomiting. 20 tablet 0  . pseudoephedrine (SUDAFED) 60 MG tablet Take 1 tablet (60 mg total) by mouth every 8 (eight) hours as needed for congestion. 20 tablet 0  .  vitamin C (ASCORBIC ACID) 500 MG tablet Take 500 mg by mouth daily.     No current facility-administered medications for this visit.     ALLERGIES: Patient has no known allergies.  Family History  Problem Relation Age of Onset  . Hypertension Mother   . Lupus Maternal Uncle   . Multiple sclerosis Maternal Grandmother   . Cancer Maternal Grandmother        breast and bone  . Diabetes Maternal Grandmother   . Breast cancer Maternal Grandmother 63       mets to bone  . Thyroid disease Maternal Grandmother        hypothyroid  . Heart attack Maternal Grandfather     Social History   Socioeconomic History  . Marital status: Single    Spouse name: Not on file  . Number of children: Not on file  . Years of education: Not on file  . Highest education level: Not on file  Occupational History  . Not on file  Tobacco Use  . Smoking status: Current Every Day Smoker    Packs/day: 0.25    Years: 6.00     Pack years: 1.50    Types: Cigarettes  . Smokeless tobacco: Never Used  Substance and Sexual Activity  . Alcohol use: Yes    Alcohol/week: 3.0 standard drinks    Types: 3 Glasses of wine per week    Comment: rarely  . Drug use: No  . Sexual activity: Yes    Birth control/protection: Pill    Comment: Junel   Other Topics Concern  . Not on file  Social History Narrative   Patient is single, with no children   Patient is a high school graduate   Patient is right handed   Patient drinks 5 or more   Social Determinants of Health   Financial Resource Strain:   . Difficulty of Paying Living Expenses: Not on file  Food Insecurity:   . Worried About Charity fundraiser in the Last Year: Not on file  . Ran Out of Food in the Last Year: Not on file  Transportation Needs:   . Lack of Transportation (Medical): Not on file  . Lack of Transportation (Non-Medical): Not on file  Physical Activity:   . Days of Exercise per Week: Not on file  . Minutes of Exercise per Session: Not on file  Stress:   . Feeling of Stress : Not on file  Social Connections:   . Frequency of Communication with Friends and Family: Not on file  . Frequency of Social Gatherings with Friends and Family: Not on file  . Attends Religious Services: Not on file  . Active Member of Clubs or Organizations: Not on file  . Attends Archivist Meetings: Not on file  . Marital Status: Not on file  Intimate Partner Violence:   . Fear of Current or Ex-Partner: Not on file  . Emotionally Abused: Not on file  . Physically Abused: Not on file  . Sexually Abused: Not on file    Review of Systems  Skin:       Acne   All other systems reviewed and are negative.   PHYSICAL EXAMINATION:    There were no vitals taken for this visit.    General appearance: alert, cooperative and appears stated age   ASSESSMENT  Contraception monitoring.  Adult acne.  Vaginal discharge. Fever.  Potential Covid exposure.    PLAN  Continue Micronor for now. She will monitor  her BP and report back.  If normal, we can consider trying her back on a low dose COC.  She declines an IUD.  We discussed spironolactone to treat her acne or seeing a dermatologist.  She will pursue Covid testing. If her vaginal discharge continues, I recommend an office visit.    An After Visit Summary was printed and given to the patient.  ___15___ minutes face to face time of which over 50% was spent in counseling.

## 2019-06-13 ENCOUNTER — Ambulatory Visit: Payer: No Typology Code available for payment source | Attending: Internal Medicine

## 2019-06-13 ENCOUNTER — Other Ambulatory Visit: Payer: Self-pay

## 2019-06-13 DIAGNOSIS — Z20822 Contact with and (suspected) exposure to covid-19: Secondary | ICD-10-CM

## 2019-06-15 LAB — NOVEL CORONAVIRUS, NAA: SARS-CoV-2, NAA: NOT DETECTED

## 2019-06-18 ENCOUNTER — Encounter: Payer: Self-pay | Admitting: Adult Health

## 2019-06-19 ENCOUNTER — Other Ambulatory Visit: Payer: Self-pay

## 2019-06-19 DIAGNOSIS — L709 Acne, unspecified: Secondary | ICD-10-CM

## 2019-07-23 ENCOUNTER — Other Ambulatory Visit: Payer: Self-pay

## 2019-07-23 ENCOUNTER — Encounter: Payer: Self-pay | Admitting: Adult Health

## 2019-07-23 ENCOUNTER — Ambulatory Visit (INDEPENDENT_AMBULATORY_CARE_PROVIDER_SITE_OTHER): Payer: No Typology Code available for payment source | Admitting: Adult Health

## 2019-07-23 VITALS — BP 130/72 | HR 82 | Ht 63.0 in | Wt 148.0 lb

## 2019-07-23 DIAGNOSIS — F339 Major depressive disorder, recurrent, unspecified: Secondary | ICD-10-CM | POA: Diagnosis not present

## 2019-07-23 DIAGNOSIS — F902 Attention-deficit hyperactivity disorder, combined type: Secondary | ICD-10-CM | POA: Insufficient documentation

## 2019-07-23 DIAGNOSIS — R41840 Attention and concentration deficit: Secondary | ICD-10-CM

## 2019-07-23 DIAGNOSIS — F32A Depression, unspecified: Secondary | ICD-10-CM | POA: Insufficient documentation

## 2019-07-23 MED ORDER — FLUOXETINE HCL 10 MG PO TABS: ORAL_TABLET | ORAL | 0 refills | Status: DC

## 2019-07-23 NOTE — Assessment & Plan Note (Signed)
Assessment and Plan: Continue current dose of Fluoxetine 10mg QD. Increase regular exercise. Referral to BH/Psychiatry for ADD/ADHD evaluation. Recommend following-up with current therapist or using [facility]. Remain off tobacco-great job!  Follow Up Instructions: OV 6 months  I discussed the assessment and treatment plan with the patient. The patient was provided an opportunity to ask questions and all were answered. The patient agreed with the plan and demonstrated an understanding of the instructions.   The patient was advised to call back or seek an in-person evaluation if the symptoms worsen or if the condition fails to improve as anticipated.  

## 2019-07-23 NOTE — Assessment & Plan Note (Signed)
>>  ASSESSMENT AND PLAN FOR POOR CONCENTRATION WRITTEN ON 07/23/2019 10:51 AM BY DANFORD, KATY D, NP  Assessment and Plan: Continue current dose of Fluoxetine 10mg  QD. Increase regular exercise. Referral to BH/Psychiatry for ADD/ADHD evaluation. Recommend following-up with current therapist or using [facility]. Remain off tobacco-great job!  Follow Up Instructions: OV 6 months  I discussed the assessment and treatment plan with the patient. The patient was provided an opportunity to ask questions and all were answered. The patient agreed with the plan and demonstrated an understanding of the instructions.   The patient was advised to call back or seek an in-person evaluation if the symptoms worsen or if the condition fails to improve as anticipated.

## 2019-07-23 NOTE — Progress Notes (Signed)
Virtual Visit via Telephone Note  I connected with Kristin Orozco on 07/23/19 at 10:30 AM EST by telephone and verified that I am speaking with the correct person using two identifiers.  Location: Patient: Home Provider: In Clinic I discussed the limitations, risks, security and privacy concerns of performing an evaluation and management service by telephone and the availability of in person appointments. I also discussed with the patient that there may be a patient responsible charge related to this service. The patient expressed understanding and agreed to proceed.   History of Present Illness:  01/18/2019 OV: Kristin Orozco is calling in today for f/u: GAD She was started on Fluoxetine, when she titrated up to full 20mg  tablet QD she reports lethargy and states "it was difficult to get out of bed". On the 1/2 tablet (total of 10mg ) she reports feeling "more relaxed, carefree, and MUCH less stress". She denies SI/HI She reports resolution of "heart fluttering". She has increased water intake- >100 oz/day She has resumed playing softball twice weekly and batting cage practice every other day. She continues to reduce tobacco use 0-3 cigarettes/day- YOU CAN DO IT! She continues to abstain from tobacco She reports restful, sound sleep 07/23/2019 OV: Kristin Orozco calls in with concerns about poor focus/concentration, especially at work. 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 She has recently applied for "more reliable positions". She moved homes with her boyfriend a few weeks ago, reports stress from the move. She has experienced poor appetite, unexplained weight loss- current wt 148, she estimates to have lost 12 lbs since starting Fluoxetine in June 2020. Body mass index is 26.22 kg/m. She states "I feel sad all the time", more pronounced the last 2 weeks. She is  currently on her menstrual cycle. She denies SI/HI. She was working with therapist- last contact 01/2019. She was not a fan of the TeleMedicine format.  PDMP reviewed- Lorazepam 1mg  total 3 tablets provided 08/18/2017  When she increased Fluoxetine from 10mg  to 20mg  - she experienced profound fatigue, unable to tolerate higher dose.  She has remained off tobacco since Nov 2020. She has increased water intake >100 oz/day. She denies regular exercise, however she routinely achieves >13K steps/day.  Depression screen [facility] 2/9 07/23/2019 01/18/2019 12/14/2018  Decreased Interest 2 2 1   Down, Depressed, Hopeless 1 1 1   PHQ - 2 Score 3 3 2   Altered sleeping 1 3 2   Tired, decreased energy 3 3 -  Change in appetite 2 3 2   Feeling bad or failure about yourself  1 1 1   Trouble concentrating 3 3 2   Moving slowly or fidgety/restless 3 3 1   Suicidal thoughts 0 0 0  PHQ-9 Score 16 19 10   Difficult doing work/chores Very difficult Somewhat difficult Somewhat difficult   Patient Care Team    Relationship Specialty Notifications Start End  [facility], NP PCP - General Family Medicine  06/08/18     Patient Active Problem List   Diagnosis Date Noted  . Depression, recurrent (HCC) 07/23/2019  . Poor concentration 07/23/2019  . Cough in adult 05/01/2019  . GAD (generalized anxiety disorder) 12/14/2018  . Lymphadenopathy 11/30/2018  . Elevated BP without diagnosis of hypertension 11/30/2018  . Loose stools 11/30/2018  . Healthcare maintenance 07/06/2018  . Thyroid nodule 07/06/2018  . Stress 07/06/2018  . Loss of consciousness (HCC) 12/14/2013     Past Medical History:  Diagnosis  Date  . Anxiety   . Syncope   . Tachycardia   . Thyroid disease    Hx of thyroid nodule--followed by PCP     History reviewed. No pertinent surgical history.   Family History  Problem Relation Age of Onset  . Hypertension Mother   . Lupus Maternal Uncle   . Multiple sclerosis Maternal Grandmother    . Cancer Maternal Grandmother        breast and bone  . Diabetes Maternal Grandmother   . Breast cancer Maternal Grandmother 63       mets to bone  . Thyroid disease Maternal Grandmother        hypothyroid  . Heart attack Maternal Grandfather      Social History   Substance and Sexual Activity  Drug Use No     Social History   Substance and Sexual Activity  Alcohol Use Yes  . Alcohol/week: 3.0 standard drinks  . Types: 3 Glasses of wine per week   Comment: rarely     Social History   Tobacco Use  Smoking Status Current Every Day Smoker  . Packs/day: 0.25  . Years: 6.00  . Pack years: 1.50  . Types: Cigarettes  Smokeless Tobacco Never Used     Outpatient Encounter Medications as of 07/23/2019  Medication Sig  . b complex vitamins capsule Take 1 capsule by mouth daily.  . cyclobenzaprine (FLEXERIL) 5 MG tablet Take 1 tablet (5 mg total) by mouth 3 (three) times daily as needed for muscle spasms.  . DENTA 5000 PLUS 1.1 % CREA dental cream USE ONCE OR TWICE PER DAY. AT LEAST ONCE BEFORE BEDTIME  . FLUoxetine (PROZAC) 10 MG tablet 1 tablet daily.  . fluticasone (FLONASE) 50 MCG/ACT nasal spray Place 2 sprays into both nostrils daily.  . naproxen (NAPROSYN) 500 MG tablet Take 1 tablet (500 mg total) by mouth 2 (two) times daily with a meal.  . norethindrone (MICRONOR,CAMILA,ERRIN) 0.35 MG tablet Take 1 tablet (0.35 mg total) by mouth daily.  . ondansetron (ZOFRAN) 4 MG tablet Take 1 tablet (4 mg total) by mouth every 8 (eight) hours as needed for nausea or vomiting.  . pseudoephedrine (SUDAFED) 60 MG tablet Take 1 tablet (60 mg total) by mouth every 8 (eight) hours as needed for congestion.  . vitamin C (ASCORBIC ACID) 500 MG tablet Take 500 mg by mouth daily.  . [DISCONTINUED] FLUoxetine (PROZAC) 10 MG tablet 1 tablet daily.   No facility-administered encounter medications on file as of 07/23/2019.    Allergies: Patient has no known allergies.  Body mass index  is 26.22 kg/m.  Blood pressure 130/72, pulse 82, height 5\' 3"  (1.6 m), weight 148 lb (67.1 kg), last menstrual period 07/22/2019. Review of Systems: General:   Denies fever, chills, unexplained weight loss.  Optho/Auditory:   Denies visual changes, blurred vision/LOV Respiratory:   Denies SOB, DOE more than baseline levels.  Cardiovascular:   Denies chest pain, palpitations, new onset peripheral edema  Gastrointestinal:   Denies nausea, vomiting, diarrhea.  Genitourinary: Denies dysuria, freq/ urgency, flank pain or discharge from genitals.  Endocrine:     Denies hot or cold intolerance, polyuria, polydipsia. Musculoskeletal:   Denies unexplained myalgias, joint swelling, unexplained arthralgias, gait problems.  Skin:  Denies rash, suspicious lesions Neurological:     Denies dizziness, unexplained weakness, numbness  Psychiatric/Behavioral:   Denies suicidal or homicidal ideations, hallucinations. Increase in depression + Poor concentration +  Observations/Objective: No acute distress noted during the telephone conversation.  Assessment and Plan: Continue current dose of Fluoxetine 10mg  QD. Increase regular exercise. Referral to BH/Psychiatry for ADD/ADHD evaluation. Recommend following-up with current therapist or using [facility]. Remain off tobacco-great job!  Follow Up Instructions: OV 6 months  I discussed the assessment and treatment plan with the patient. The patient was provided an opportunity to ask questions and all were answered. The patient agreed with the plan and demonstrated an understanding of the instructions.   The patient was advised to call back or seek an in-person evaluation if the symptoms worsen or if the condition fails to improve as anticipated.  I provided  15 minutes of non-face-to-face time during this encounter.   , NP

## 2019-07-23 NOTE — Assessment & Plan Note (Signed)
Assessment and Plan: Continue current dose of Fluoxetine 10mg  QD. Increase regular exercise. Referral to BH/Psychiatry for ADD/ADHD evaluation. Recommend following-up with current therapist or using [facility]. Remain off tobacco-great job!  Follow Up Instructions: OV 6 months  I discussed the assessment and treatment plan with the patient. The patient was provided an opportunity to ask questions and all were answered. The patient agreed with the plan and demonstrated an understanding of the instructions.   The patient was advised to call back or seek an in-person evaluation if the symptoms worsen or if the condition fails to improve as anticipated.

## 2019-07-25 ENCOUNTER — Other Ambulatory Visit: Payer: Self-pay | Admitting: Adult Health

## 2019-07-25 MED ORDER — FLUOXETINE HCL 10 MG PO TABS: ORAL_TABLET | ORAL | 0 refills | Status: DC

## 2019-07-30 ENCOUNTER — Other Ambulatory Visit: Payer: Self-pay | Admitting: Adult Health

## 2019-07-30 ENCOUNTER — Other Ambulatory Visit: Payer: Self-pay | Admitting: Obstetrics and Gynecology

## 2019-07-30 NOTE — Telephone Encounter (Signed)
Medication refill request: Micronor Last AEX:  08/18/18 BS Next AEX: patient had appt for 08/22/19 but canceled Last MMG (if hormonal medication request): n/a Refill authorized: Please advise on refill; Order pended #56 tablets w/0 refills if authorized

## 2019-07-30 NOTE — Telephone Encounter (Signed)
07/30/2019  RX refill on file for 07/25/2019 was marked "no print" and therefore was not sent to the pharmacy.  Tiajuana Amass, CMA

## 2019-08-20 ENCOUNTER — Encounter: Payer: Self-pay | Admitting: Family Medicine

## 2019-08-20 ENCOUNTER — Other Ambulatory Visit: Payer: Self-pay

## 2019-08-20 ENCOUNTER — Ambulatory Visit (INDEPENDENT_AMBULATORY_CARE_PROVIDER_SITE_OTHER): Payer: No Typology Code available for payment source | Admitting: Family Medicine

## 2019-08-20 VITALS — BP 147/87 | HR 74 | Ht 63.0 in | Wt 147.0 lb

## 2019-08-20 DIAGNOSIS — Z308 Encounter for other contraceptive management: Secondary | ICD-10-CM | POA: Diagnosis not present

## 2019-08-20 DIAGNOSIS — N898 Other specified noninflammatory disorders of vagina: Secondary | ICD-10-CM

## 2019-08-20 DIAGNOSIS — Z113 Encounter for screening for infections with a predominantly sexual mode of transmission: Secondary | ICD-10-CM | POA: Diagnosis not present

## 2019-08-20 DIAGNOSIS — Z01419 Encounter for gynecological examination (general) (routine) without abnormal findings: Secondary | ICD-10-CM

## 2019-08-20 NOTE — Progress Notes (Signed)
Discuss changing OCP  Breast tenderness frequently  Issues with discharge and odor and trouble with acne.   Pt thinks that all these issues started after she started the micronor, has been on that since July 2020

## 2019-08-20 NOTE — Progress Notes (Signed)
GYNECOLOGY ANNUAL PREVENTATIVE CARE ENCOUNTER NOTE  Subjective:   Kristin Orozco is a 24 y.o. G0P0000 female here for a routine annual gynecologic exam.  Current complaints: does not like micronor and have discharge since starting.  Has been on depo before and gained weight.  Denies vaginal bleeding, discharge, pelvic pain, problems with intercourse or other gynecologic concerns.    Gynecologic History Patient's last menstrual period was 07/22/2019 (exact date). Contraception: oral progesterone-only contraceptive Last Pap: 2029. Results were: normal Last mammogram: NA  The following portions of the patient's history were reviewed and updated as appropriate: allergies, current medications, past family history, past medical history, past social history, past surgical history and problem list.  Review of Systems Pertinent items are noted in HPI.   Objective:  BP (!) 157/101   Pulse 78   Ht 5\' 3"  (1.6 m)   Wt 147 lb (66.7 kg)   LMP 07/22/2019 (Exact Date)   BMI 26.04 kg/m     CONSTITUTIONAL: Well-developed, well-nourished female in no acute distress.  HENT:  Normocephalic, atraumatic, External right and left ear normal. Oropharynx is clear and moist EYES:  No scleral icterus.  NECK: Normal range of motion, supple, no masses.  Normal thyroid.  SKIN: Skin is warm and dry. No rash noted. Not diaphoretic. No erythema. No pallor. NEUROLOGIC: Alert and oriented to person, place, and time. Normal reflexes, muscle tone coordination. No cranial nerve deficit noted. PSYCHIATRIC: Normal mood and affect. Normal behavior. Normal judgment and thought content. CARDIOVASCULAR: Normal heart rate noted, regular rhythm. 2+ distal pulses. RESPIRATORY: Effort and breath sounds normal, no problems with respiration noted. BREASTS: Symmetric in size. No masses, nipple drainage, or lymphadenopathy. +rought dry patches on bilateral areola - consistent with eczema ABDOMEN: Soft,  no distention noted.   No tenderness, rebound or guarding.  PELVIC: Normal appearing external genitalia; normal appearing vaginal mucosa and cervix.  No abnormal discharge noted.  Pap not indicated. GC/CT collected. Normal uterine size, no other palpable masses, no uterine or adnexal tenderness. MUSCULOSKELETAL: Normal range of motion.   Assessment and Plan:  1) Annual gynecologic examination - pap not indicated:  Will follow up results of pap smear and manage accordingly. STI screen also ordered today- declined HIV/RPR.  Routine preventative health maintenance measures emphasized.  2) Contraception counseling: Reviewed all forms of birth control options available including abstinence; over the counter/barrier methods; hormonal contraceptive medication including pill, patch, ring, injection,contraceptive implant; hormonal and nonhormonal IUDs; permanent sterilization options including vasectomy and the various tubal sterilization modalities. Risks and benefits reviewed.  Questions were answered.  Written information was also given to the patient to review.  Patient desires lo-loestrin but counseled on avoiding estrogen.  She was told to call with any further questions, or with any concerns about this method of contraception.  Emphasized use of condoms 100% of the time for STI prevention.  1. Screening examination for venereal disease -Declines HIV/RPR screening - Cervicovaginal ancillary only  2. Well woman exam with routine gynecological exam Pap UTD  3. Encounter for other contraceptive management Has elevated BP today and has in the past, attributed in the past to anxiety.  Discussed progesterone only as safest option but repeat BP was 148/80. Patient reports very normal BP at home 120/70s.  Plan to submit 1 week of daily BP through MyChart. If all wnl will prescribe lo-loestrin.   Please refer to After Visit Summary for other counseling recommendations.   Return if symptoms worsen or fail to  improve.  Caren Macadam, MD, MPH, ABFM Attending Physician Center for [facility]

## 2019-08-22 ENCOUNTER — Ambulatory Visit: Payer: No Typology Code available for payment source | Admitting: Obstetrics and Gynecology

## 2019-08-22 LAB — CERVICOVAGINAL ANCILLARY ONLY
Bacterial Vaginitis (gardnerella): NEGATIVE
Candida Glabrata: NEGATIVE
Candida Vaginitis: NEGATIVE
Chlamydia: NEGATIVE
Comment: NEGATIVE
Comment: NEGATIVE
Comment: NEGATIVE
Comment: NEGATIVE
Comment: NEGATIVE
Comment: NORMAL
Neisseria Gonorrhea: NEGATIVE
Trichomonas: NEGATIVE

## 2019-08-28 DIAGNOSIS — R030 Elevated blood-pressure reading, without diagnosis of hypertension: Secondary | ICD-10-CM

## 2019-08-28 DIAGNOSIS — Z308 Encounter for other contraceptive management: Secondary | ICD-10-CM

## 2019-09-03 ENCOUNTER — Encounter: Payer: Self-pay | Admitting: *Deleted

## 2019-09-03 MED ORDER — LO LOESTRIN FE 1 MG-10 MCG / 10 MCG PO TABS: 1.0000 | ORAL_TABLET | Freq: Every day | ORAL | 3 refills | Status: DC

## 2019-09-07 ENCOUNTER — Encounter: Payer: Self-pay | Admitting: *Deleted

## 2019-09-07 ENCOUNTER — Other Ambulatory Visit: Payer: Self-pay | Admitting: Family Medicine

## 2019-09-07 DIAGNOSIS — Z30011 Encounter for initial prescription of contraceptive pills: Secondary | ICD-10-CM

## 2019-09-07 MED ORDER — NORGESTIMATE-ETH ESTRADIOL 0.25-35 MG-MCG PO TABS: 1.0000 | ORAL_TABLET | Freq: Every day | ORAL | 11 refills | Status: DC

## 2019-09-07 NOTE — Progress Notes (Signed)
Sent in Sprintec to replace Lo-loestrin

## 2019-09-13 ENCOUNTER — Encounter: Payer: Self-pay | Admitting: *Deleted

## 2019-09-17 ENCOUNTER — Ambulatory Visit (HOSPITAL_COMMUNITY): Payer: Self-pay | Admitting: Psychiatry

## 2019-10-03 IMAGING — US US AXILLARY RIGHT
1 series · 5 of 5 positions shown · non-contrast
Comparison: None

CLINICAL DATA: 22-year-old patient with concern of intermittent
right axillary palpable swelling/mass. She states that this happens
from time to time for several years. It is not currently swollen.
She is unsure if the symptoms are related to her menstrual cycle.
She states that her maternal grandmother has a history of breast
cancer in her late 40s.

EXAM:
ULTRASOUND OF THE RIGHT BREAST

[Series 1: us axillary right · 0.07mm/px · 5 of 5 slices shown]
[im 1/5]
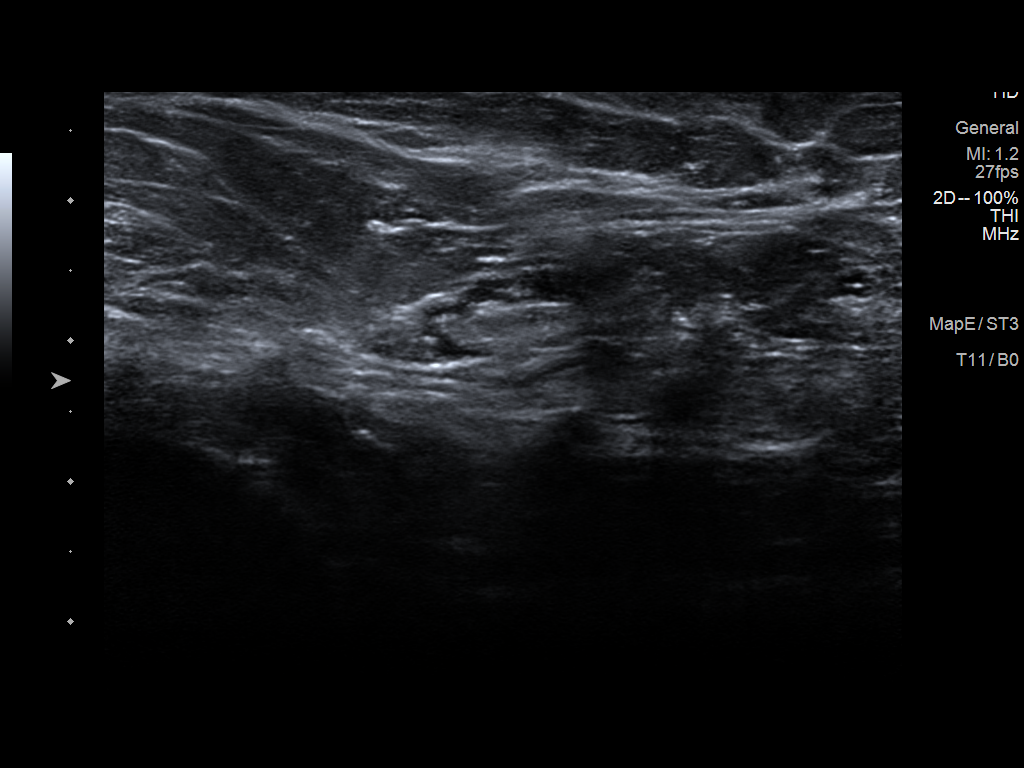
[im 2/5]
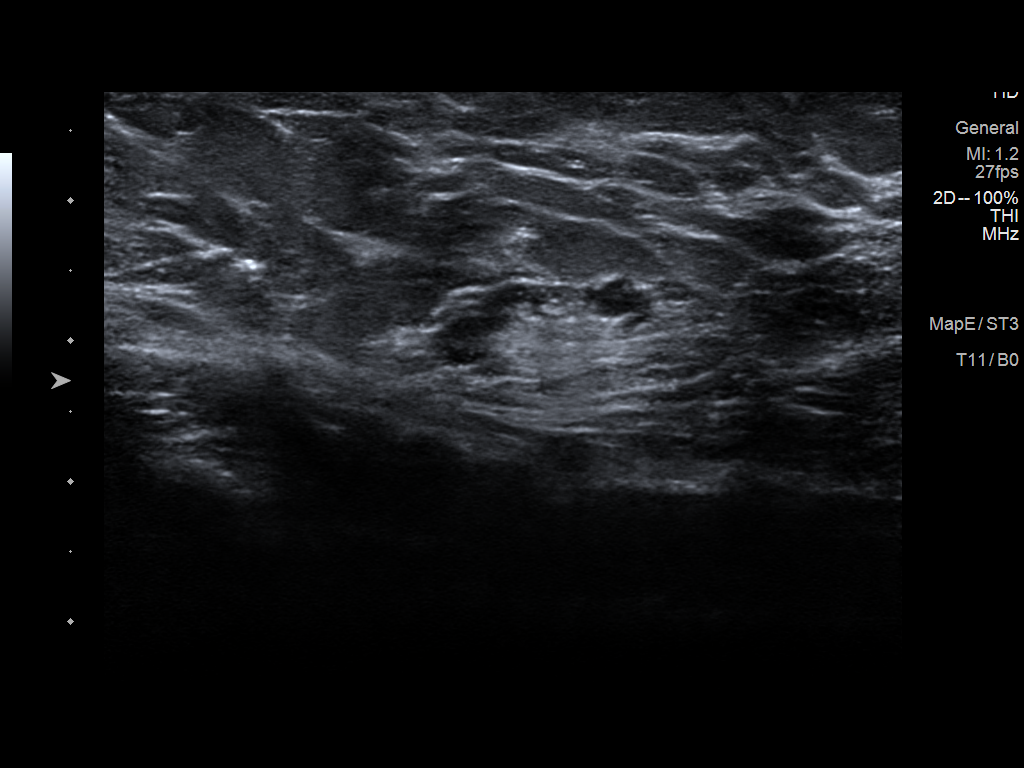
[im 3/5]
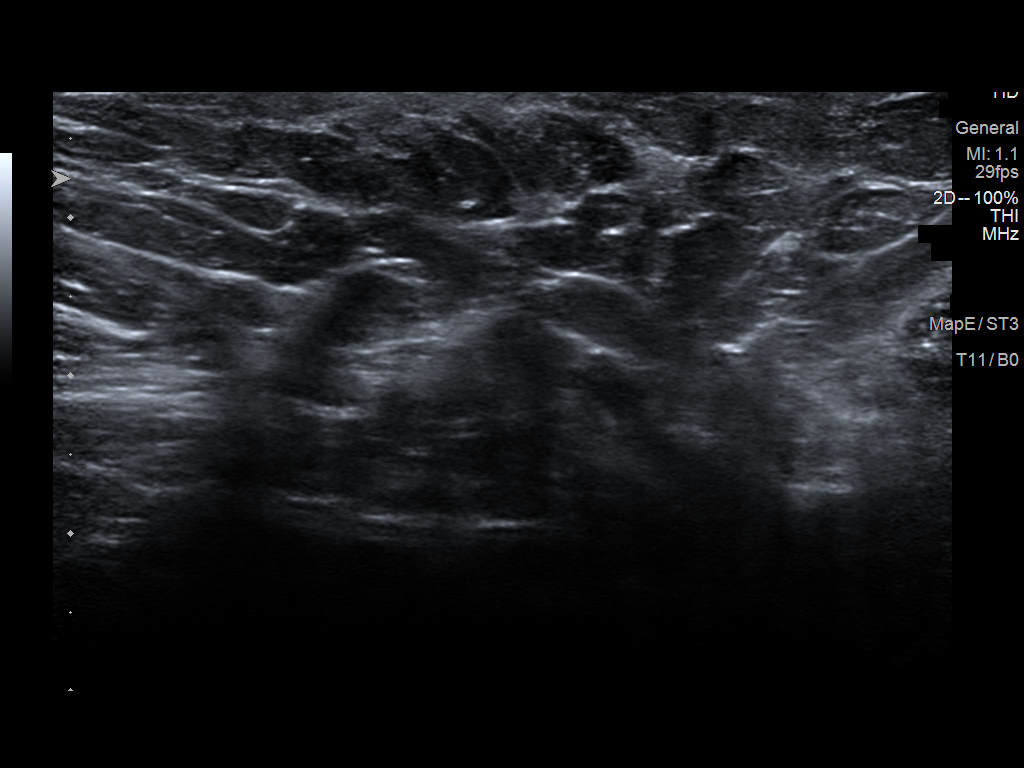
[im 4/5]
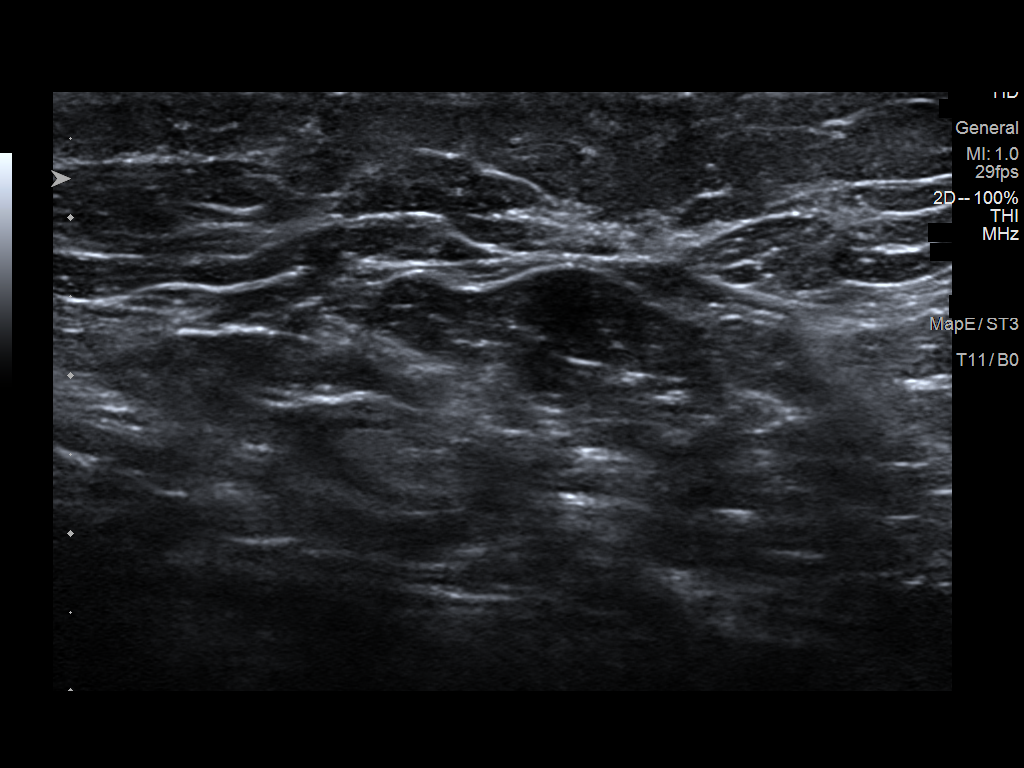
[im 5/5]
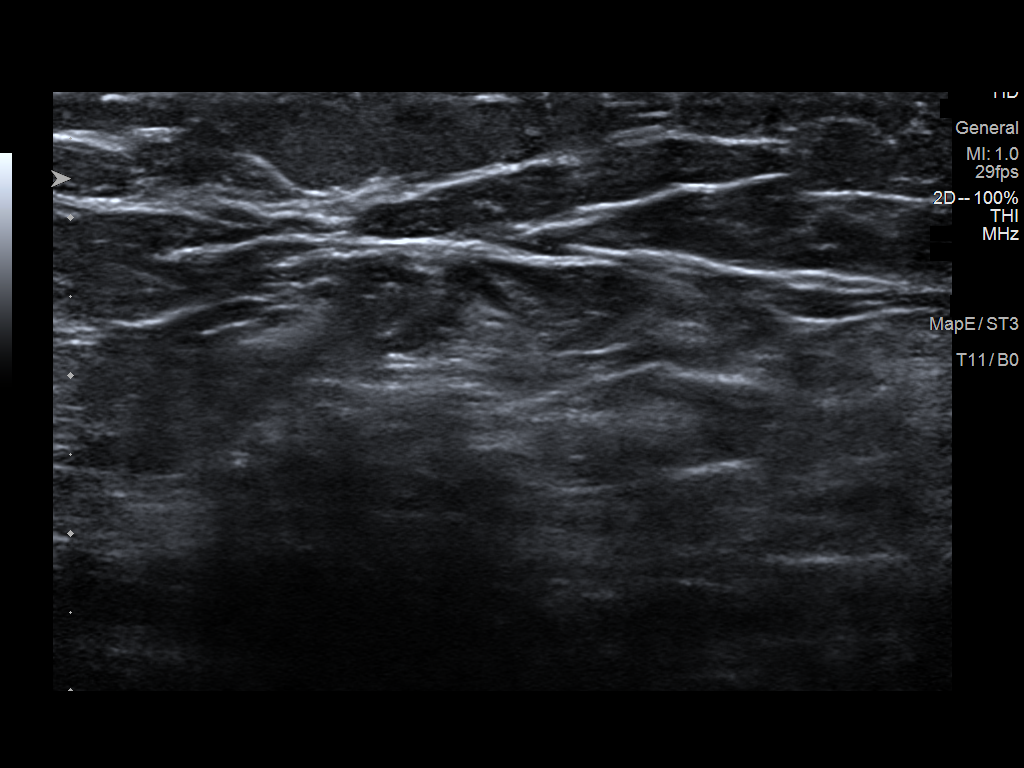

[5 of 5 positions shown; findings below may reference images not displayed]

FINDINGS: On physical exam, the right axilla is soft to palpation. No mass or
lymphadenopathy is palpated the skin appears normal.

Targeted ultrasound is performed, showing normal axillary lymph
nodes. A small area of superficial axillary breast parenchyma is
imaged, a normal variant. No suspicious mass, fluid collection, or
cystic mass.
IMPRESSION: No suspicious findings in the right axilla. Negative for
lymphadenopathy.

A small area of axillary breast parenchyma is imaged, a normal
variant. This can be associated with intermittent axillary fullness
and tenderness, usually cyclical.

RECOMMENDATION:
Screening mammogram at age 40 unless there are persistent or
intervening clinical concerns. (Code:[id number]-M-M04)

I have discussed the findings and recommendations with the patient.
Results were also provided in writing at the conclusion of the
visit. If applicable, a reminder letter will be sent to the patient
regarding the next appointment.

BI-RADS CATEGORY  2: Benign.

## 2019-10-18 ENCOUNTER — Ambulatory Visit (HOSPITAL_COMMUNITY): Payer: Self-pay | Admitting: Psychiatry

## 2019-11-21 ENCOUNTER — Other Ambulatory Visit: Payer: Self-pay | Admitting: Adult Health

## 2019-11-21 MED ORDER — FLUOXETINE HCL 10 MG PO TABS: ORAL_TABLET | ORAL | 0 refills | Status: DC

## 2020-02-18 ENCOUNTER — Encounter: Payer: Self-pay | Admitting: Physician Assistant

## 2020-02-18 ENCOUNTER — Ambulatory Visit (INDEPENDENT_AMBULATORY_CARE_PROVIDER_SITE_OTHER): Payer: No Typology Code available for payment source | Admitting: Physician Assistant

## 2020-02-18 ENCOUNTER — Other Ambulatory Visit: Payer: Self-pay

## 2020-02-18 VITALS — BP 137/69 | HR 76 | Temp 98.6°F | Ht 63.0 in | Wt 155.5 lb

## 2020-02-18 DIAGNOSIS — F339 Major depressive disorder, recurrent, unspecified: Secondary | ICD-10-CM

## 2020-02-18 DIAGNOSIS — F909 Attention-deficit hyperactivity disorder, unspecified type: Secondary | ICD-10-CM

## 2020-02-18 DIAGNOSIS — F411 Generalized anxiety disorder: Secondary | ICD-10-CM | POA: Diagnosis not present

## 2020-02-18 MED ORDER — AMPHETAMINE-DEXTROAMPHET ER 10 MG PO CP24: 10.0000 mg | ORAL_CAPSULE | Freq: Every day | ORAL | 0 refills | Status: DC

## 2020-02-18 NOTE — Patient Instructions (Addendum)
Continue Prozac 10mg    Generalized Anxiety Disorder, Adult Generalized anxiety disorder (GAD) is a mental health disorder. People with this condition constantly worry about everyday events. Unlike normal anxiety, worry related to GAD is not triggered by a specific event. These worries also do not fade or get better with time. GAD interferes with life functions, including relationships, work, and school. GAD can vary from mild to severe. People with severe GAD can have intense waves of anxiety with physical symptoms (panic attacks). What are the causes? The exact cause of GAD is not known. What increases the risk? This condition is more likely to develop in:  Women.  People who have a family history of anxiety disorders.  People who are very shy.  People who experience very stressful life events, such as the death of a loved one.  People who have a very stressful family environment. What are the signs or symptoms? People with GAD often worry excessively about many things in their lives, such as their health and family. They may also be overly concerned about:  Doing well at work.  Being on time.  Natural disasters.  Friendships. Physical symptoms of GAD include:  Fatigue.  Muscle tension or having muscle twitches.  Trembling or feeling shaky.  Being easily startled.  Feeling like your heart is pounding or racing.  Feeling out of breath or like you cannot take a deep breath.  Having trouble falling asleep or staying asleep.  Sweating.  Nausea, diarrhea, or irritable bowel syndrome (IBS).  Headaches.  Trouble concentrating or remembering facts.  Restlessness.  Irritability. How is this diagnosed? Your health care provider can diagnose GAD based on your symptoms and medical history. You will also have a physical exam. The health care provider will ask specific questions about your symptoms, including how severe they are, when they started, and if they come and  go. Your health care provider may ask you about your use of alcohol or drugs, including prescription medicines. Your health care provider may refer you to a mental health specialist for further evaluation. Your health care provider will do a thorough examination and may perform additional tests to rule out other possible causes of your symptoms. To be diagnosed with GAD, a person must have anxiety that:  Is out of his or her control.  Affects several different aspects of his or her life, such as work and relationships.  Causes distress that makes him or her unable to take part in normal activities.  Includes at least three physical symptoms of GAD, such as restlessness, fatigue, trouble concentrating, irritability, muscle tension, or sleep problems. Before your health care provider can confirm a diagnosis of GAD, these symptoms must be present more days than they are not, and they must last for six months or longer. How is this treated? The following therapies are usually used to treat GAD:  Medicine. Antidepressant medicine is usually prescribed for long-term daily control. Antianxiety medicines may be added in severe cases, especially when panic attacks occur.  Talk therapy (psychotherapy). Certain types of talk therapy can be helpful in treating GAD by providing support, education, and guidance. Options include: ? Cognitive behavioral therapy (CBT). People learn coping skills and techniques to ease their anxiety. They learn to identify unrealistic or negative thoughts and behaviors and to replace them with positive ones. ? Acceptance and commitment therapy (ACT). This treatment teaches people how to be mindful as a way to cope with unwanted thoughts and feelings. ? Biofeedback. This process trains you  to manage your body's response (physiological response) through breathing techniques and relaxation methods. You will work with a therapist while machines are used to monitor your physical  symptoms.  Stress management techniques. These include yoga, meditation, and exercise. A mental health specialist can help determine which treatment is best for you. Some people see improvement with one type of therapy. However, other people require a combination of therapies. Follow these instructions at home:  Take over-the-counter and prescription medicines only as told by your health care provider.  Try to maintain a normal routine.  Try to anticipate stressful situations and allow extra time to manage them.  Practice any stress management or self-calming techniques as taught by your health care provider.  Do not punish yourself for setbacks or for not making progress.  Try to recognize your accomplishments, even if they are small.  Keep all follow-up visits as told by your health care provider. This is important. Contact a health care provider if:  Your symptoms do not get better.  Your symptoms get worse.  You have signs of depression, such as: ? A persistently sad, cranky, or irritable mood. ? Loss of enjoyment in activities that used to bring you joy. ? Change in weight or eating. ? Changes in sleeping habits. ? Avoiding friends or family members. ? Loss of energy for normal tasks. ? Feelings of guilt or worthlessness. Get help right away if:  You have serious thoughts about hurting yourself or others. If you ever feel like you may hurt yourself or others, or have thoughts about taking your own life, get help right away. You can go to your nearest emergency department or call:  Your local emergency services (911 in the [place]  A suicide crisis helpline, such as the National Suicide Prevention Lifeline at 925-460-4194. This is open 24 hours a day. Summary  Generalized anxiety disorder (GAD) is a mental health disorder that involves worry that is not triggered by a specific event.  People with GAD often worry excessively about many things in their lives, such as  their health and family.  GAD may cause physical symptoms such as restlessness, trouble concentrating, sleep problems, frequent sweating, nausea, diarrhea, headaches, and trembling or muscle twitching.  A mental health specialist can help determine which treatment is best for you. Some people see improvement with one type of therapy. However, other people require a combination of therapies. This information is not intended to replace advice given to you by your health care provider. Make sure you discuss any questions you have with your health care provider. Document Revised: 05/27/2017 Document Reviewed: 05/04/2016 Elsevier Patient Education  2020 [employer] Fluoxetine capsules or tablets (Depression/Mood Disorders) What is this medicine? FLUOXETINE (floo OX e teen) belongs to a class of drugs known as selective serotonin reuptake inhibitors (SSRIs). It helps to treat mood problems such as depression, obsessive compulsive disorder, and panic attacks. It can also treat certain eating disorders. This medicine may be used for other purposes; ask your health care provider or pharmacist if you have questions. COMMON BRAND NAME(S): Prozac What should I tell my health care provider before I take this medicine? They need to know if you have any of these conditions:  bipolar disorder or a family history of bipolar disorder  bleeding disorders  glaucoma  heart disease  liver disease  low levels of sodium in the blood  seizures  suicidal thoughts, plans, or attempt; a previous suicide attempt by you or a family member  take MAOIs  like Carbex, Eldepryl, Marplan, Nardil, and Parnate  take medicines that treat or prevent blood clots  thyroid disease  an unusual or allergic reaction to fluoxetine, other medicines, foods, dyes, or preservatives  pregnant or trying to get pregnant  breast-feeding How should I use this medicine? Take this medicine by mouth with a glass of water. Follow  the directions on the prescription label. You can take this medicine with or without food. Take your medicine at regular intervals. Do not take it more often than directed. Do not stop taking this medicine suddenly except upon the advice of your doctor. Stopping this medicine too quickly may cause serious side effects or your condition may worsen. A special MedGuide will be given to you by the pharmacist with each prescription and refill. Be sure to read this information carefully each time. Talk to your pediatrician regarding the use of this medicine in children. While this drug may be prescribed for children as young as 7 years for selected conditions, precautions do apply. Overdosage: If you think you have taken too much of this medicine contact a poison control center or emergency room at once. NOTE: This medicine is only for you. Do not share this medicine with others. What if I miss a dose? If you miss a dose, skip the missed dose and go back to your regular dosing schedule. Do not take double or extra doses. What may interact with this medicine? Do not take this medicine with any of the following medications:  other medicines containing fluoxetine, like Sarafem or Symbyax  cisapride  dronedarone  linezolid  MAOIs like Carbex, Eldepryl, Marplan, Nardil, and Parnate  methylene blue (injected into a vein)  pimozide  thioridazine This medicine may also interact with the following medications:  alcohol  amphetamines  aspirin and aspirin-like medicines  carbamazepine  certain medicines for depression, anxiety, or psychotic disturbances  certain medicines for migraine headaches like almotriptan, eletriptan, frovatriptan, naratriptan, rizatriptan, sumatriptan, zolmitriptan  digoxin  diuretics  fentanyl  flecainide  furazolidone  isoniazid  lithium  medicines for sleep  medicines that treat or prevent blood clots like warfarin, enoxaparin, and  dalteparin  NSAIDs, medicines for pain and inflammation, like ibuprofen or naproxen  other medicines that prolong the QT interval (an abnormal heart rhythm)  phenytoin  procarbazine  propafenone  rasagiline  ritonavir  supplements like St. John's wort, kava kava, valerian  tramadol  tryptophan  vinblastine This list may not describe all possible interactions. Give your health care provider a list of all the medicines, herbs, non-prescription drugs, or dietary supplements you use. Also tell them if you smoke, drink alcohol, or use illegal drugs. Some items may interact with your medicine. What should I watch for while using this medicine? Tell your doctor if your symptoms do not get better or if they get worse. Visit your doctor or health care professional for regular checks on your progress. Because it may take several weeks to see the full effects of this medicine, it is important to continue your treatment as prescribed by your doctor. Patients and their families should watch out for new or worsening thoughts of suicide or depression. Also watch out for sudden changes in feelings such as feeling anxious, agitated, panicky, irritable, hostile, aggressive, impulsive, severely restless, overly excited and hyperactive, or not being able to sleep. If this happens, especially at the beginning of treatment or after a change in dose, call your health care professional. Bonita Quin may get drowsy or dizzy. Do not drive, use  machinery, or do anything that needs mental alertness until you know how this medicine affects you. Do not stand or sit up quickly, especially if you are an older patient. This reduces the risk of dizzy or fainting spells. Alcohol may interfere with the effect of this medicine. Avoid alcoholic drinks. Your mouth may get dry. Chewing sugarless gum or sucking hard candy, and drinking plenty of water may help. Contact your doctor if the problem does not go away or is severe. This  medicine may affect blood sugar levels. If you have diabetes, check with your doctor or health care professional before you change your diet or the dose of your diabetic medicine. What side effects may I notice from receiving this medicine? Side effects that you should report to your doctor or health care professional as soon as possible:  allergic reactions like skin rash, itching or hives, swelling of the face, lips, or tongue  anxious  black, tarry stools  breathing problems  changes in vision  confusion  elevated mood, decreased need for sleep, racing thoughts, impulsive behavior  eye pain  fast, irregular heartbeat  feeling faint or lightheaded, falls  feeling agitated, angry, or irritable  hallucination, loss of contact with reality  loss of balance or coordination  loss of memory  painful or prolonged erections  restlessness, pacing, inability to keep still  seizures  stiff muscles  suicidal thoughts or other mood changes  trouble sleeping  unusual bleeding or bruising  unusually weak or tired  vomiting Side effects that usually do not require medical attention (report to your doctor or health care professional if they continue or are bothersome):  change in appetite or weight  change in sex drive or performance  diarrhea  dry mouth  headache  increased sweating  nausea  tremors This list may not describe all possible side effects. Call your doctor for medical advice about side effects. You may report side effects to FDA at 1-800-FDA-1088. Where should I keep my medicine? Keep out of the reach of children. Store at room temperature between 15 and 30 degrees C (59 and 86 degrees F). Throw away any unused medicine after the expiration date. NOTE: This sheet is a summary. It may not cover all possible information. If you have questions about this medicine, talk to your doctor, pharmacist, or health care provider.  2020 Elsevier/Gold Standard  (2018-02-02 11:56:53)  Attention Deficit Hyperactivity Disorder, Adult Attention deficit hyperactivity disorder (ADHD) is a mental health disorder that starts during childhood (neurodevelopmental disorder). For many people with ADHD, the disorder continues into the adult years. Treatment can help you manage your symptoms. What are the causes? The exact cause of ADHD is not known. Most experts believe genetics and environmental factors contribute to ADHD. What increases the risk? The following factors may make you more likely to develop this condition:  Having a family history of ADHD.  Being female.  Being born to a mother who smoked or drank alcohol during pregnancy.  Being exposed to lead or other toxins in the womb or early in life.  Being born before 37 weeks of pregnancy (prematurely) or at a low birth weight.  Having experienced a brain injury. What are the signs or symptoms? Symptoms of this condition depend on the type of ADHD. The two main types are inattentive and hyperactive-impulsive. Some people may have symptoms of both types. Symptoms of the inattentive type include:  Difficulty paying attention.  Making careless mistakes.  Not following instructions.  Being disorganized.  Avoiding tasks that require time and attention.  Losing and forgetting things.  Being easily distracted. Symptoms of the hyperactive-impulsive type include:  Restlessness.  Talking too much.  Interrupting.  Difficulty with: ? Sitting still. ? Feeling motivated. ? Relaxing. ? Waiting in line or waiting for a turn. In adults, this condition may lead to certain problems, such as:  Keeping jobs.  Performing tasks at work.  Having stable relationships.  Being on time or keeping to a schedule. How is this diagnosed? This condition is diagnosed based on your current symptoms and your history of symptoms. The diagnosis can be made by a health care provider such as a primary care  provider or a mental health care specialist. Your health care provider may use a symptom checklist or a behavior rating scale to evaluate your symptoms. He or she may also want to talk with people who have observed your behaviors throughout your life. How is this treated? This condition can be treated with medicines and behavior therapy. Medicines may be the best option to reduce impulsive behaviors and improve attention. Your health care provider may recommend:  Stimulant medicines. These are the most common medicines used for adult ADHD. They affect certain chemicals in the brain (neurotransmitters) and improve your ability to control your symptoms.  A non-stimulant medicine for adult ADHD (atomoxetine). This medicine increases a neurotransmitter called norepinephrine. It may take weeks to months to see effects from this medicine. Counseling and behavioral management are also important for treating ADHD. Counseling is often used along with medicine. Your health care provider may suggest:  Cognitive behavioral therapy (CBT). This type of therapy teaches you to replace negative thoughts and actions with positive thoughts and actions. When used as part of ADHD treatment, this therapy may also include: ? Coping strategies for organization, time management, impulse control, and stress reduction. ? Mindfulness and meditation training.  Behavioral management. You may work with a Psychologist, occupational who is specially trained to help people with ADHD manage and organize activities and function more effectively. Follow these instructions at home: Medicines   Take over-the-counter and prescription medicines only as told by your health care provider.  Talk with your health care provider about the possible side effects of your medicines and how to manage them. Lifestyle   Do not use drugs.  Do not drink alcohol if: ? Your health care provider tells you not to drink. ? You are pregnant, may be pregnant, or are  planning to become pregnant.  If you drink alcohol: ? Limit how much you use to:  0-1 drink a day for women.  0-2 drinks a day for men. ? Be aware of how much alcohol is in your drink. In the [place] one drink equals one 12 oz bottle of beer (355 mL), one 5 oz glass of wine (148 mL), or one 1 oz glass of hard liquor (44 mL).  Get enough sleep.  Eat a healthy diet.  Exercise regularly. Exercise can help to reduce stress and anxiety. General instructions  Learn as much as you can about adult ADHD, and work closely with your health care providers to find the treatments that work best for you.  Follow the same schedule each day.  Use reminder devices like notes, calendars, and phone apps to stay on time and organized.  Keep all follow-up visits as told by your health care provider and therapist. This is important. Where to find more information A health care provider may be able to recommend resources that  are available online or over the phone. You could start with:  Attention Deficit Disorder Association (ADDA): http://davis-dillon.net/  [employer] of Mental Health [facility]): http://www.maynard.net/ Contact a health care provider if:  Your symptoms continue to cause problems.  You have side effects from your medicine, such as: ? Repeated muscle twitches, coughing, or speech outbursts. ? Sleep problems. ? Loss of appetite. ? Dizziness. ? Unusually fast heartbeat. ? Stomach pains. ? Headaches.  You are struggling with anxiety, depression, or substance abuse. Get help right away if you:  Have a severe reaction to a medicine. If you ever feel like you may hurt yourself or others, or have thoughts about taking your own life, get help right away. You can go to the nearest emergency department or call:  Your local emergency services (911 in the [place]  A suicide crisis helpline, such as the National Suicide Prevention Lifeline at (825)127-4261. This is open 24 hours a day. Summary  ADHD  is a mental health disorder that starts during childhood (neurodevelopmental disorder) and often continues into the adult years.  The exact cause of ADHD is not known. Most experts believe genetics and environmental factors contribute to ADHD.  There is no cure for ADHD, but treatment with medicine, cognitive behavioral therapy, or behavioral management can help you manage your condition. This information is not intended to replace advice given to you by your health care provider. Make sure you discuss any questions you have with your health care provider. Document Revised: 11/06/2018 Document Reviewed: 11/06/2018 Elsevier Patient Education  2020 [employer].

## 2020-02-18 NOTE — Progress Notes (Signed)
Established Patient Office Visit  Subjective:  Patient ID: Kristin Orozco, female    DOB: 10/28/1995  Age: 24 y.o. MRN: [id number]  CC:  Chief Complaint  Patient presents with  . Anxiety  . Depression    HPI Kristin Orozco presents for follow-up on mood management.  Patient reports her mood is some better but continues to experience fatigue.  States she has a new job where she has a more consistent routine and is able to keep up, but at times she can be forgetful.  She does report a strong family history of ADHD and wonders if she could possibly have ADHD as an adult.  Reports compliance with Prozac.  In the past she has tried Wellbutrin and Lexapro. Denies SI/HI.  Past Medical History:  Diagnosis Date  . Anxiety   . Syncope   . Tachycardia   . Thyroid disease    Hx of thyroid nodule--followed by PCP    History reviewed. No pertinent surgical history.  Family History  Problem Relation Age of Onset  . Hypertension Mother   . Lupus Maternal Uncle   . Multiple sclerosis Maternal Grandmother   . Cancer Maternal Grandmother        breast and bone  . Diabetes Maternal Grandmother   . Breast cancer Maternal Grandmother 63       mets to bone  . Thyroid disease Maternal Grandmother        hypothyroid  . Heart attack Maternal Grandfather     Social History   Socioeconomic History  . Marital status: Single    Spouse name: Not on file  . Number of children: Not on file  . Years of education: Not on file  . Highest education level: Not on file  Occupational History  . Not on file  Tobacco Use  . Smoking status: Current Every Day Smoker    Packs/day: 0.25    Years: 6.00    Pack years: 1.50    Types: Cigarettes  . Smokeless tobacco: Never Used  Vaping Use  . Vaping Use: Never used  Substance and Sexual Activity  . Alcohol use: Yes    Alcohol/week: 3.0 standard drinks    Types: 3 Glasses of wine per week    Comment: rarely  . Drug use: No  . Sexual  activity: Yes    Birth control/protection: Pill    Comment: Junel   Other Topics Concern  . Not on file  Social History Narrative   Patient is single, with no children   Patient is a high school graduate   Patient is right handed   Patient drinks 5 or more   Social Determinants of Health   Financial Resource Strain:   . Difficulty of Paying Living Expenses: Not on file  Food Insecurity:   . Worried About Programme researcher, broadcasting/film/video in the Last Year: Not on file  . Ran Out of Food in the Last Year: Not on file  Transportation Needs:   . Lack of Transportation (Medical): Not on file  . Lack of Transportation (Non-Medical): Not on file  Physical Activity:   . Days of Exercise per Week: Not on file  . Minutes of Exercise per Session: Not on file  Stress:   . Feeling of Stress : Not on file  Social Connections:   . Frequency of Communication with Friends and Family: Not on file  . Frequency of Social Gatherings with Friends and Family: Not on file  . Attends Religious  Services: Not on file  . Active Member of Clubs or Organizations: Not on file  . Attends Banker Meetings: Not on file  . Marital Status: Not on file  Intimate Partner Violence:   . Fear of Current or Ex-Partner: Not on file  . Emotionally Abused: Not on file  . Physically Abused: Not on file  . Sexually Abused: Not on file    Outpatient Medications Prior to Visit  Medication Sig Dispense Refill  . b complex vitamins capsule Take 1 capsule by mouth daily.    . cyclobenzaprine (FLEXERIL) 5 MG tablet Take 1 tablet (5 mg total) by mouth 3 (three) times daily as needed for muscle spasms. 15 tablet 0  . DENTA 5000 PLUS 1.1 % CREA dental cream USE ONCE OR TWICE PER DAY. AT LEAST ONCE BEFORE BEDTIME 1 Tube 0  . fluticasone (FLONASE) 50 MCG/ACT nasal spray Place 2 sprays into both nostrils daily. 16 g 0  . naproxen (NAPROSYN) 500 MG tablet Take 1 tablet (500 mg total) by mouth 2 (two) times daily with a meal. 20  tablet 0  . norgestimate-ethinyl estradiol (ORTHO-CYCLEN) 0.25-35 MG-MCG tablet Take 1 tablet by mouth daily. 1 Package 11  . ondansetron (ZOFRAN) 4 MG tablet Take 1 tablet (4 mg total) by mouth every 8 (eight) hours as needed for nausea or vomiting. 20 tablet 0  . pseudoephedrine (SUDAFED) 60 MG tablet Take 1 tablet (60 mg total) by mouth every 8 (eight) hours as needed for congestion. 20 tablet 0  . vitamin C (ASCORBIC ACID) 500 MG tablet Take 500 mg by mouth daily.    Marland Kitchen FLUoxetine (PROZAC) 10 MG tablet TAKE 1 TABLET BY MOUTH DAILY 90 tablet 0   No facility-administered medications prior to visit.    No Known Allergies  ROS Review of Systems  A fourteen system review of systems was performed and found to be positive as per HPI.   Objective:    Physical Exam General: Well nourished, in no apparent distress. Eyes: PERRLA, EOMs, conjunctiva clr Resp: Respiratory effort- normal, ECTA B/L w/o W/R/R  Cardio: RRR w/o MRGs. Abdomen: no gross distention. Lymphatics:  less 2 sec cap RF M-sk: Full ROM, good strength, normal gait.  Skin: Warm, dry  Neuro: Alert, Oriented Psych: Normal affect, Insight and Judgment appropriate.   BP 137/69   Pulse 76   Temp 98.6 F (37 C) (Oral)   Ht 5\' 3"  (1.6 m)   Wt 155 lb 8 oz (70.5 kg)   SpO2 99%   BMI 27.55 kg/m  Wt Readings from Last 3 Encounters:  02/18/20 155 lb 8 oz (70.5 kg)  08/20/19 147 lb (66.7 kg)  07/23/19 148 lb (67.1 kg)     Health Maintenance Due  Topic Date Due  . Hepatitis C Screening  Never done  . COVID-19 Vaccine (1) Never done  . HIV Screening  Never done  . TETANUS/TDAP  01/30/2017  . INFLUENZA VACCINE  01/27/2020    There are no preventive care reminders to display for this patient.  Lab Results  Component Value Date   TSH 4.410 09/15/2018   Lab Results  Component Value Date   WBC 12.8 (H) 11/30/2018   HGB 14.8 11/30/2018   HCT 42.2 11/30/2018   MCV 88 11/30/2018   PLT 365 11/30/2018   Lab Results   Component Value Date   NA 137 09/15/2018   K 4.2 09/15/2018   CO2 22 09/15/2018   GLUCOSE 109 (H) 09/15/2018  BUN 8 09/15/2018   CREATININE 0.58 09/15/2018   BILITOT 0.3 09/15/2018   ALKPHOS 105 09/15/2018   AST 24 09/15/2018   ALT 35 (H) 09/15/2018   PROT 6.9 09/15/2018   ALBUMIN 4.4 09/15/2018   CALCIUM 9.3 09/15/2018   ANIONGAP 2 (L) 12/09/2013   Lab Results  Component Value Date   CHOL 165 09/15/2018   Lab Results  Component Value Date   HDL 44 09/15/2018   Lab Results  Component Value Date   LDLCALC 106 (H) 09/15/2018   Lab Results  Component Value Date   TRIG 74 09/15/2018   Lab Results  Component Value Date   CHOLHDL 3.8 09/15/2018   Lab Results  Component Value Date   HGBA1C 5.4 09/15/2018      Assessment & Plan:   Problem List Items Addressed This Visit      Other   GAD (generalized anxiety disorder) - Primary   Depression, recurrent (HCC)    Other Visit Diagnoses    Adult ADHD       Relevant Medications   amphetamine-dextroamphetamine (ADDERALL XR) 10 MG 24 hr capsule     GAD, Depression: -PHQ-9 score of 13 and GAD-7 score of 20  -ADHD symptoms could possibly be overlapping with her mood and will start Adderall XR, so will continue current medication management with Prozac.  Will reassess symptoms at follow-up appointment and will consider medication adjustments if PHQ-9 and GAD-7 scores continue to be elevated. -Encouraged to incorporate nonpharmacological therapy such as relaxation techniques and exercise.  Adult ADHD: -Adult ADHD self-report scale symptom checklist is highly suggestive of adult ADHD. -Discussed with patient various management options including risk versus benefits and is agreeable to starting Adderall XR once daily. -Discussed with patient follow-ups every 3 months for medication management since Adderall is a controlled substance.  Patient verbalized understanding. -Follow-up in 8 weeks to reassess symptoms and  medication therapy.  Meds ordered this encounter  Medications  . amphetamine-dextroamphetamine (ADDERALL XR) 10 MG 24 hr capsule    Sig: Take 1 capsule (10 mg total) by mouth daily.    Dispense:  30 capsule    Refill:  0    Order Specific Question:   Supervising Provider    Answer:   Nani Gasser D [[id number]]    Follow-up: Return in about 8 weeks (around 04/14/2020).    Mayer Masker, PA-C

## 2020-03-04 ENCOUNTER — Other Ambulatory Visit: Payer: Self-pay | Admitting: Physician Assistant

## 2020-03-11 ENCOUNTER — Other Ambulatory Visit: Payer: Self-pay | Admitting: Student

## 2020-04-17 ENCOUNTER — Ambulatory Visit: Payer: No Typology Code available for payment source | Admitting: Physician Assistant

## 2020-05-12 ENCOUNTER — Ambulatory Visit: Payer: No Typology Code available for payment source | Admitting: Physician Assistant

## 2020-06-16 ENCOUNTER — Other Ambulatory Visit: Payer: Self-pay | Admitting: Physician Assistant

## 2020-06-16 ENCOUNTER — Ambulatory Visit: Payer: No Typology Code available for payment source | Admitting: Physician Assistant

## 2020-06-16 ENCOUNTER — Encounter: Payer: Self-pay | Admitting: Radiology

## 2020-07-07 ENCOUNTER — Encounter: Payer: Self-pay | Admitting: Physician Assistant

## 2020-07-19 ENCOUNTER — Encounter: Payer: Self-pay | Admitting: Nurse Practitioner

## 2020-07-19 ENCOUNTER — Telehealth: Payer: No Typology Code available for payment source | Admitting: Nurse Practitioner

## 2020-07-19 DIAGNOSIS — H6690 Otitis media, unspecified, unspecified ear: Secondary | ICD-10-CM

## 2020-07-19 MED ORDER — AMOXICILLIN-POT CLAVULANATE 875-125 MG PO TABS: 1.0000 | ORAL_TABLET | Freq: Two times a day (BID) | ORAL | 0 refills | Status: AC

## 2020-07-19 MED ORDER — OFLOXACIN 0.3 % OT SOLN: 10.0000 [drp] | Freq: Two times a day (BID) | OTIC | 0 refills | Status: DC

## 2020-07-19 MED ORDER — FLUTICASONE PROPIONATE 50 MCG/ACT NA SUSP: 2.0000 | Freq: Every day | NASAL | 0 refills | Status: DC

## 2020-07-19 NOTE — Patient Instructions (Signed)
Otitis Media, Adult  Otitis media is a condition in which the middle ear is red and swollen (inflamed) and full of fluid. The middle ear is the part of the ear that contains bones for hearing as well as air that helps send sounds to the brain. The condition usually goes away on its own. What are the causes? This condition is caused by a blockage in the eustachian tube. The eustachian tube connects the middle ear to the back of the nose. It normally allows air into the middle ear. The blockage is caused by fluid or swelling. Problems that can cause blockage include:  A cold or infection that affects the nose, mouth, or throat.  Allergies.  An irritant, such as tobacco smoke.  Adenoids that have become large. The adenoids are soft tissue located in the back of the throat, behind the nose and the roof of the mouth.  Growth or swelling in the upper part of the throat, just behind the nose (nasopharynx).  Damage to the ear caused by change in pressure. This is called barotrauma. What are the signs or symptoms? Symptoms of this condition include:  Ear pain.  Fever.  Problems with hearing.  Being tired.  Fluid leaking from the ear.  Ringing in the ear. How is this treated? This condition can go away on its own within 3-5 days. But if the condition is caused by bacteria or does not go away on its own, or if it keeps coming back, your doctor may:  Give you antibiotic medicines.  Give you medicines for pain. Follow these instructions at home:  Take over-the-counter and prescription medicines only as told by your doctor.  If you were prescribed an antibiotic medicine, take it as told by your doctor. Do not stop taking the antibiotic even if you start to feel better.  Keep all follow-up visits as told by your doctor. This is important. Contact a doctor if:  You have bleeding from your nose.  There is a lump on your neck.  You are not feeling better in 5 days.  You feel worse  instead of better. Get help right away if:  You have pain that is not helped with medicine.  You have swelling, redness, or pain around your ear.  You get a stiff neck.  You cannot move part of your face (paralysis).  You notice that the bone behind your ear hurts when you touch it.  You get a very bad headache. Summary  Otitis media means that the middle ear is red, swollen, and full of fluid.  This condition usually goes away on its own.  If the problem does not go away, treatment may be needed. You may be given medicines to treat the infection or to treat your pain.  If you were prescribed an antibiotic medicine, take it as told by your doctor. Do not stop taking the antibiotic even if you start to feel better.  Keep all follow-up visits as told by your doctor. This is important. This information is not intended to replace advice given to you by your health care provider. Make sure you discuss any questions you have with your health care provider. Document Revised: 05/17/2019 Document Reviewed: 05/17/2019 Elsevier Patient Education  2021 Elsevier Inc.  

## 2020-07-19 NOTE — Progress Notes (Signed)
Virtual Visit Progress Note  I connected with Kristin Orozco on 07/19/20 at  1:30 PM EST by video enabled telemedicine visit and verified that I am speaking with the correct person using two identifiers.   I discussed the limitations, risks, security and privacy concerns of performing an evaluation and management service by telemedicine and the availability of in-person appointments. I also discussed with the patient that there may be a patient responsible charge related to this service. The patient expressed understanding and agreed to proceed.   Other persons participating in the visit and their role in the encounter: none  Patient's location: home Provider's location: home  Chief Complaint: ear pain    Patient Care Team: Ellouise Newer as PCP - General (Physician Assistant)   Name of the patient: Kristin Orozco  [id number]  [birth year]   Date of visit: 07/19/20  Chief complaint/ Reason for visit- ear pain   History of Presenting Illness- Kristin Orozco, 25 year old female, presents with ear pain. She has a history of ear infections in the past. Has had congestion, malaise, fatigue for few days then noticed today painful, inflamed, reddened left ear with drainage. No fevers. 2 negative covid tests. No recent antibiotics. No allergies. No loss of hearing.   Review of systems- Review of Systems  Constitutional: Positive for malaise/fatigue. Negative for chills, fever and weight loss.  HENT: Positive for congestion, ear discharge, ear pain (left) and sinus pain. Negative for sore throat and tinnitus.   Eyes: Negative.  Negative for pain, discharge and redness.  Respiratory: Negative.  Negative for cough, sputum production and shortness of breath.   Cardiovascular: Negative for chest pain, palpitations, orthopnea, claudication and leg swelling.  Gastrointestinal: Negative for abdominal pain, blood in stool, constipation, diarrhea, heartburn, nausea and vomiting.   Genitourinary: Negative.   Musculoskeletal: Negative.   Skin: Negative.   Neurological: Negative for dizziness, tingling, weakness and headaches.  Endo/Heme/Allergies: Negative.   Psychiatric/Behavioral: Negative.    No Known Allergies  Past Medical History:  Diagnosis Date  . Anxiety   . Syncope   . Tachycardia   . Thyroid disease    Hx of thyroid nodule--followed by PCP    No past surgical history on file.  Social History   Socioeconomic History  . Marital status: Single    Spouse name: Not on file  . Number of children: Not on file  . Years of education: Not on file  . Highest education level: Not on file  Occupational History  . Not on file  Tobacco Use  . Smoking status: Current Every Day Smoker    Packs/day: 0.25    Years: 6.00    Pack years: 1.50    Types: Cigarettes  . Smokeless tobacco: Never Used  Vaping Use  . Vaping Use: Never used  Substance and Sexual Activity  . Alcohol use: Yes    Alcohol/week: 3.0 standard drinks    Types: 3 Glasses of wine per week    Comment: rarely  . Drug use: No  . Sexual activity: Yes    Birth control/protection: Pill    Comment: Junel   Other Topics Concern  . Not on file  Social History Narrative   Patient is single, with no children   Patient is a high school graduate   Patient is right handed   Patient drinks 5 or more   Social Determinants of Health   Financial Resource Strain: Not on file  Food Insecurity: Not on file  Transportation Needs: Not on file  Physical Activity: Not on file  Stress: Not on file  Social Connections: Not on file  Intimate Partner Violence: Not on file    Immunization History  Administered Date(s) Administered  . DTaP 04/25/1996, 07/13/1996, 08/24/1996, 08/23/1997, 06/06/2000  . Hepatitis A 03/06/2008, 08/03/2012  . Hepatitis B 02-06-1996, 04/25/1996, 08/24/1996  . HiB (PRP-OMP) 04/25/1996, 07/13/1996, 08/24/1996, 08/23/1997  . IPV 04/25/1996, 07/13/1996, 02/14/1997,  06/06/2000  . Influenza-Unspecified 03/07/2018  . MMR 02/14/1997, 06/06/2000  . Meningococcal Conjugate 08/03/2012  . Tdap 01/31/2007  . Varicella 02/14/1997, 08/03/2012    Family History  Problem Relation Age of Onset  . Hypertension Mother   . Lupus Maternal Uncle   . Multiple sclerosis Maternal Grandmother   . Cancer Maternal Grandmother        breast and bone  . Diabetes Maternal Grandmother   . Breast cancer Maternal Grandmother 63       mets to bone  . Thyroid disease Maternal Grandmother        hypothyroid  . Heart attack Maternal Grandfather      Current Outpatient Medications:  .  amphetamine-dextroamphetamine (ADDERALL XR) 10 MG 24 hr capsule, Take 1 capsule (10 mg total) by mouth daily., Disp: 30 capsule, Rfl: 0 .  b complex vitamins capsule, Take 1 capsule by mouth daily., Disp: , Rfl:  .  cyclobenzaprine (FLEXERIL) 5 MG tablet, Take 1 tablet (5 mg total) by mouth 3 (three) times daily as needed for muscle spasms., Disp: 15 tablet, Rfl: 0 .  DENTA 5000 PLUS 1.1 % CREA dental cream, USE ONCE OR TWICE PER DAY. AT LEAST ONCE BEFORE BEDTIME, Disp: 1 Tube, Rfl: 0 .  FLUoxetine (PROZAC) 10 MG tablet, TAKE 1 TABLET BY MOUTH DAILY, Disp: 60 tablet, Rfl: 0 .  fluticasone (FLONASE) 50 MCG/ACT nasal spray, Place 2 sprays into both nostrils daily., Disp: 16 g, Rfl: 0 .  naproxen (NAPROSYN) 500 MG tablet, Take 1 tablet (500 mg total) by mouth 2 (two) times daily with a meal., Disp: 20 tablet, Rfl: 0 .  norgestimate-ethinyl estradiol (ORTHO-CYCLEN) 0.25-35 MG-MCG tablet, Take 1 tablet by mouth daily., Disp: 1 Package, Rfl: 11 .  ondansetron (ZOFRAN) 4 MG tablet, Take 1 tablet (4 mg total) by mouth every 8 (eight) hours as needed for nausea or vomiting., Disp: 20 tablet, Rfl: 0 .  pseudoephedrine (SUDAFED) 60 MG tablet, Take 1 tablet (60 mg total) by mouth every 8 (eight) hours as needed for congestion., Disp: 20 tablet, Rfl: 0 .  vitamin C (ASCORBIC ACID) 500 MG tablet, Take 500 mg  by mouth daily., Disp: , Rfl:   Physical exam: Exam limited due to telemedicine Physical Exam Constitutional:      General: She is not in acute distress.    Comments: Laying on couch with ear on compress  HENT:     Head: Normocephalic.     Right Ear: External ear normal.     Ears:     Comments: Left outer ear reddened.  Neurological:     Mental Status: She is alert.  Psychiatric:        Mood and Affect: Mood normal.        Behavior: Behavior normal.       Assessment and plan- Patient is a 25 y.o. female who presents for evaluation of ear infection. Likely secondary to viral infection. Recommend augmentin x 7 days with topical ofloxacin for possible TM rupture. Flonase to help with congestion. Tylenol and/or ibuprofen for pain. If  no improvement or worsening of symptoms consider cefdinir vs levofloxacin. Use cotton ball when showering for possible TM rupture. No swimming or submerging head in water. Supportive care for underlying viral infection discussed.   Notify clinic if symptoms don't improve or worsen for re-evaluation.    Visit Diagnosis 1. Acute otitis media, unspecified otitis media type     Patient expressed understanding and was in agreement with this plan. She also understands that She can call clinic at any time with any questions, concerns, or complaints.   I discussed the assessment and treatment plan with the patient. The patient was provided an opportunity to ask questions and all were answered. The patient agreed with the plan and demonstrated an understanding of the instructions.   The patient was advised to call back or seek an in-person evaluation if the symptoms worsen or if the condition fails to improve as anticipated.   I provided 20 minutes of face-to-face video visit time during this encounter, and > 50% was spent counseling as documented under my assessment & plan.  Thank you for allowing me to participate in the care of this very pleasant patient.    Beckey Rutter, DNP, AGNP-C [facility]  CC: PCP

## 2020-07-21 ENCOUNTER — Encounter: Payer: Self-pay | Admitting: Emergency Medicine

## 2020-07-21 ENCOUNTER — Ambulatory Visit
Admission: EM | Admit: 2020-07-21 | Discharge: 2020-07-21 | Disposition: A | Discharge status: Home / Self Care | Payer: No Typology Code available for payment source | Attending: Family Medicine | Admitting: Family Medicine

## 2020-07-21 ENCOUNTER — Encounter: Payer: Self-pay | Admitting: Physician Assistant

## 2020-07-21 ENCOUNTER — Other Ambulatory Visit: Payer: Self-pay

## 2020-07-21 ENCOUNTER — Ambulatory Visit (INDEPENDENT_AMBULATORY_CARE_PROVIDER_SITE_OTHER): Payer: No Typology Code available for payment source | Admitting: Physician Assistant

## 2020-07-21 VITALS — BP 133/83 | HR 91 | Temp 98.7°F | Ht 64.0 in | Wt 155.0 lb

## 2020-07-21 DIAGNOSIS — F909 Attention-deficit hyperactivity disorder, unspecified type: Secondary | ICD-10-CM

## 2020-07-21 DIAGNOSIS — F411 Generalized anxiety disorder: Secondary | ICD-10-CM | POA: Diagnosis not present

## 2020-07-21 DIAGNOSIS — H6693 Otitis media, unspecified, bilateral: Secondary | ICD-10-CM | POA: Diagnosis not present

## 2020-07-21 MED ORDER — KETOROLAC TROMETHAMINE 10 MG PO TABS: 10.0000 mg | ORAL_TABLET | Freq: Four times a day (QID) | ORAL | 0 refills | Status: DC | PRN

## 2020-07-21 NOTE — Patient Instructions (Signed)

## 2020-07-21 NOTE — Progress Notes (Signed)
Telehealth office visit note for Mayer Masker, PA-C- at Primary Care at [facility]   I connected with current patient today by telephone and verified that I am speaking with the correct person   . Location of the patient: Home . Location of the provider: Office - This visit type was conducted due to national recommendations for restrictions regarding the COVID-19 Pandemic (e.g. social distancing) in an effort to limit this patient's exposure and mitigate transmission in our community.    - No physical exam could be performed with this format, beyond that communicated to [place] by the patient/ family members as noted.   - Additionally my office staff/ schedulers were to discuss with the patient that there may be a monetary charge related to this service, depending on their medical insurance.  My understanding is that patient understood and consented to proceed.     _________________________________________________________________________________   History of Present Illness: Patient calls in to follow up on ADHD and mood management.  Patient reports she got Adderall filled 02/26/2020 and delayed starting it until she started school, which was about 2 weeks ago (07/02/20). Has noticed she is able to concentrate more, is getting more work done and also helping with focusing.  Has no trouble with sleeping.  Denies palpitations or hypertension.  Mood: Taking medication without issues. Reports Prozac is working well for anxiety.    GAD 7 : Generalized Anxiety Score 02/18/2020 07/23/2019 01/18/2019  Nervous, Anxious, on Edge 3 3 2   Control/stop worrying 3 3 3   Worry too much - different things 3 3 3   Trouble relaxing 2 3 3   Restless 3 3 3   Easily annoyed or irritable 3 3 2   Afraid - awful might happen 3 3 3   Total GAD 7 Score 20 21 19   Anxiety Difficulty Very difficult Very difficult Somewhat difficult    Depression screen [facility] 2/9 07/21/2020 02/18/2020 07/23/2019 01/18/2019 12/14/2018   Decreased Interest 0 1 2 2 1   Down, Depressed, Hopeless 0 1 1 1 1   PHQ - 2 Score 0 2 3 3 2   Altered sleeping 0 2 1 3 2   Tired, decreased energy 0 2 3 3  -  Change in appetite 0 2 2 3 2   Feeling bad or failure about yourself  0 0 1 1 1   Trouble concentrating 0 3 3 3 2   Moving slowly or fidgety/restless 0 2 3 3 1   Suicidal thoughts 0 0 0 0 0  PHQ-9 Score 0 13 16 19 10   Difficult doing work/chores - Very difficult Very difficult Somewhat difficult Somewhat difficult      Impression and Recommendations:     1. GAD (generalized anxiety disorder)   2. Adult ADHD     GAD: -Controlled. PHQ-9 score of 0. -Continue current medication regimen. -Will continue to monitor.  Adult ADHD: -Improved. -PDMP reviewed, Adderall filled 02/26/20. Patient not due for refill yet since started medication around 07/02/20. -Follow up in 3 months for medication management.   - As part of my medical decision making, I reviewed the following data within the electronic MEDICAL RECORD NUMBER History obtained from pt /family, CMA notes reviewed and incorporated if applicable, Labs reviewed, Radiograph/ tests reviewed if applicable and OV notes from prior OV's with me, as well as any other specialists she/he has seen since seeing me last, were all reviewed and used in my medical decision making process today.    - Additionally, when appropriate, discussion had with patient regarding our  treatment plan, and their biases/concerns about that plan were used in my medical decision making today.    - The patient agreed with the plan and demonstrated an understanding of the instructions.   No barriers to understanding were identified.     - The patient was advised to call back or seek an in-person evaluation if the symptoms worsen or if the condition fails to improve as anticipated.   Return in about 3 months (around 10/19/2020) for Mood, ADHD.    No orders of the defined types were placed in this encounter.   No  orders of the defined types were placed in this encounter.   There are no discontinued medications.     Time spent on visit including pre-visit chart review and post-visit care was 15 minutes.      The 21st Century Cures Act was signed into law in 2016 which includes the topic of electronic health records.  This provides immediate access to information in MyChart.  This includes consultation notes, operative notes, office notes, lab results and pathology reports.  If you have any questions about what you read please let us know at your next visit or call us at the office.  We are right here with you.  Note:  This note was prepared with assistance of Dragon voice recognition software. Occasional wrong-word or sound-a-like substitutions may have occurred due to the inherent limitations of voice recognition software.  __________________________________________________________________________________     Patient Care Team    Relationship Specialty Notifications Start End  Mayer Masker, [place] PCP - General Physician Assistant  02/15/20      -Vitals obtained; medications/ allergies reconciled;  personal medical, social, Sx etc.histories were updated by CMA, reviewed by me and are reflected in chart   Patient Active Problem List   Diagnosis Date Noted  . Depression, recurrent (HCC) 07/23/2019  . Poor concentration 07/23/2019  . GAD (generalized anxiety disorder) 12/14/2018  . Lymphadenopathy 11/30/2018  . Elevated BP without diagnosis of hypertension 11/30/2018  . Loose stools 11/30/2018  . Healthcare maintenance 07/06/2018  . Thyroid nodule 07/06/2018  . Stress 07/06/2018     Current Meds  Medication Sig  . amoxicillin-clavulanate (AUGMENTIN) 875-125 MG tablet Take 1 tablet by mouth 2 (two) times daily for 7 days.  Marland Kitchen amphetamine-dextroamphetamine (ADDERALL XR) 10 MG 24 hr capsule Take 1 capsule (10 mg total) by mouth daily.  Marland Kitchen b complex vitamins capsule Take 1 capsule by  mouth daily.  . cyclobenzaprine (FLEXERIL) 5 MG tablet Take 1 tablet (5 mg total) by mouth 3 (three) times daily as needed for muscle spasms.  . DENTA 5000 PLUS 1.1 % CREA dental cream USE ONCE OR TWICE PER DAY. AT LEAST ONCE BEFORE BEDTIME  . FLUoxetine (PROZAC) 10 MG tablet TAKE 1 TABLET BY MOUTH DAILY  . fluticasone (FLONASE) 50 MCG/ACT nasal spray Place 2 sprays into both nostrils daily.  . naproxen (NAPROSYN) 500 MG tablet Take 1 tablet (500 mg total) by mouth 2 (two) times daily with a meal.  . norgestimate-ethinyl estradiol (ORTHO-CYCLEN) 0.25-35 MG-MCG tablet Take 1 tablet by mouth daily.  Marland Kitchen ofloxacin (FLOXIN OTIC) 0.3 % OTIC solution Place 10 drops into the left ear 2 (two) times daily.  . ondansetron (ZOFRAN) 4 MG tablet Take 1 tablet (4 mg total) by mouth every 8 (eight) hours as needed for nausea or vomiting.  . pseudoephedrine (SUDAFED) 60 MG tablet Take 1 tablet (60 mg total) by mouth every 8 (eight) hours as needed for congestion.  Marland Kitchen  vitamin C (ASCORBIC ACID) 500 MG tablet Take 500 mg by mouth daily.     Allergies:  No Known Allergies   ROS:  See above HPI for pertinent positives and negatives   Objective:   Blood pressure 133/83, pulse 91, temperature 98.7 F (37.1 C), height 5\' 4"  (1.626 m), weight 155 lb (70.3 kg), last menstrual period 06/20/2020.  (if some vitals are omitted, this means that patient was UNABLE to obtain them.) General: A & O * 3; sounds in no acute distress; in usual state of health.  Respiratory: speaking in full sentences, no conversational dyspnea Psych: insight appears good, mood- appears full

## 2020-07-21 NOTE — Discharge Instructions (Addendum)
Continue antibiotics.  Medication as needed for pain.  Okay to return to work.  Take care  Dr. Adriana Simas

## 2020-07-21 NOTE — ED Provider Notes (Signed)
[facility]    CSN: [id number] Arrival date & time: 07/21/20  1716  History   Chief Complaint Chief Complaint  Patient presents with  . Otalgia    bilateral   HPI  25 year old female presents with the above complaint.  Patient reports that she has been sick for the past 2 days.  She has had hoarseness, sore throat, bilateral ear pain.  She is currently on ofloxacin eardrops as well as Augmentin for otitis media.  She has had Covid testing twice and both of them have been negative.  She has been tested again today.  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  She states that she is most bothered by the pain in her ears.  She states it is keeping her from sleep.  No fever.  No other complaints or concerns at this time.  Past Medical History:  Diagnosis Date  . Anxiety   . Syncope   . Tachycardia   . Thyroid disease    Hx of thyroid nodule--followed by PCP    Patient Active Problem List   Diagnosis Date Noted  . Depression, recurrent (HCC) 07/23/2019  . Poor concentration 07/23/2019  . GAD (generalized anxiety disorder) 12/14/2018  . Lymphadenopathy 11/30/2018  . Elevated BP without diagnosis of hypertension 11/30/2018  . Loose stools 11/30/2018  . Healthcare maintenance 07/06/2018  . Thyroid nodule 07/06/2018  . Stress 07/06/2018    History reviewed. No pertinent surgical history.  OB History    Gravida  0   Para  0   Term  0   Preterm  0   AB  0   Living  0     SAB  0   IAB  0   Ectopic  0   Multiple  0   Live Births  0            Home Medications    Prior to Admission medications   Medication Sig Start Date End Date Taking? Authorizing Provider  amoxicillin-clavulanate (AUGMENTIN) 875-125 MG tablet Take 1 tablet by mouth 2 (two) times daily for 7 days. 07/19/20 07/26/20 Yes Alinda Dooms, NP  amphetamine-dextroamphetamine (ADDERALL XR) 10 MG 24 hr capsule Take 1 capsule (10 mg total) by mouth daily. 02/18/20  Yes Abonza, Maritza,  PA-C  FLUoxetine (PROZAC) 10 MG tablet TAKE 1 TABLET BY MOUTH DAILY 06/16/20  Yes Abonza, Maritza, PA-C  ketorolac (TORADOL) 10 MG tablet Take 1 tablet (10 mg total) by mouth every 6 (six) hours as needed for moderate pain or severe pain. 07/21/20  Yes Alleya Demeter G, DO  naproxen (NAPROSYN) 500 MG tablet Take 1 tablet (500 mg total) by mouth 2 (two) times daily with a meal. 08/04/18  Yes Barnett Abu, [place] D, PA-C  norgestimate-ethinyl estradiol (ORTHO-CYCLEN) 0.25-35 MG-MCG tablet Take 1 tablet by mouth daily. 09/07/19  Yes Federico Flake, MD  ofloxacin (FLOXIN OTIC) 0.3 % OTIC solution Place 10 drops into the left ear 2 (two) times daily. 07/19/20  Yes Alinda Dooms, NP  vitamin C (ASCORBIC ACID) 500 MG tablet Take 500 mg by mouth daily.   Yes [provider]  b complex vitamins capsule Take 1 capsule by mouth daily.    [provider]  DENTA 5000 PLUS 1.1 % CREA dental cream USE ONCE OR TWICE PER DAY. AT LEAST ONCE BEFORE BEDTIME 12/14/18   Danford, Orpha Bur D, NP  fluticasone (FLONASE) 50 MCG/ACT nasal spray Place 2 sprays into both nostrils daily. 07/19/20  Alinda Dooms, NP  ondansetron (ZOFRAN) 4 MG tablet Take 1 tablet (4 mg total) by mouth every 8 (eight) hours as needed for nausea or vomiting. 08/15/18   Ofilia Neas, PA-C  pseudoephedrine (SUDAFED) 60 MG tablet Take 1 tablet (60 mg total) by mouth every 8 (eight) hours as needed for congestion. 08/04/18   Magdalene River, PA-C    Family History Family History  Problem Relation Age of Onset  . Hypertension Mother   . Lupus Maternal Uncle   . Multiple sclerosis Maternal Grandmother   . Cancer Maternal Grandmother        breast and bone  . Diabetes Maternal Grandmother   . Breast cancer Maternal Grandmother 63       mets to bone  . Thyroid disease Maternal Grandmother        hypothyroid  . Heart attack Maternal Grandfather     Social History Social History   Tobacco Use  . Smoking status: Current  Every Day Smoker    Packs/day: 0.25    Years: 6.00    Pack years: 1.50    Types: Cigarettes  . Smokeless tobacco: Former Neurosurgeon    Quit date: 05/22/2019  Vaping Use  . Vaping Use: Never used  Substance Use Topics  . Alcohol use: Yes    Alcohol/week: 3.0 standard drinks    Types: 3 Glasses of wine per week    Comment: rarely  . Drug use: No     Allergies   Patient has no known allergies.   Review of Systems Review of Systems  Constitutional: Negative for fever.  HENT: Positive for ear pain, sore throat and voice change.    Physical Exam Triage Vital Signs ED Triage Vitals  Enc Vitals Group     BP 07/21/20 1725 (!) 152/99     Pulse Rate 07/21/20 1725 (!) 123     Resp 07/21/20 1725 20     Temp 07/21/20 1725 98.3 F (36.8 C)     Temp Source 07/21/20 1725 Oral     SpO2 07/21/20 1725 99 %     Weight 07/21/20 1729 154 lb 15.7 oz (70.3 kg)     Height 07/21/20 1729 5\' 4"  (1.626 m)     Head Circumference --      Peak Flow --      Pain Score 07/21/20 1728 6     Pain Loc --      Pain Edu? --      Excl. in GC? --    Updated Vital Signs BP (!) 152/99 (BP Location: Left Arm)   Pulse (!) 123   Temp 98.3 F (36.8 C) (Oral)   Resp 20   Ht 5\' 4"  (1.626 m)   Wt 70.3 kg   LMP 06/16/2020   SpO2 99%   BMI 26.60 kg/m   Visual Acuity Right Eye Distance:   Left Eye Distance:   Bilateral Distance:    Right Eye Near:   Left Eye Near:    Bilateral Near:     Physical Exam Vitals and nursing note reviewed.  Constitutional:      General: She is not in acute distress.    Appearance: Normal appearance. She is not ill-appearing.  HENT:     Head: Normocephalic and atraumatic.     Ears:     Comments: Erythematous TM's bilaterally. Effusion. Eyes:     General:        Right eye: No discharge.  Left eye: No discharge.     Conjunctiva/sclera: Conjunctivae normal.  Cardiovascular:     Rate and Rhythm: Regular rhythm. Tachycardia present.  Pulmonary:     Effort:  Pulmonary effort is normal. No respiratory distress.     Breath sounds: No wheezing, rhonchi or rales.  Neurological:     Mental Status: She is alert.  Psychiatric:        Mood and Affect: Mood normal.        Behavior: Behavior normal.     UC Treatments / Results  Labs (all labs ordered are listed, but only abnormal results are displayed) Labs Reviewed - No data to display  EKG   Radiology No results found.  Procedures Procedures (including critical care time)  Medications Ordered in UC Medications - No data to display  Initial Impression / Assessment and Plan / UC Course  I have reviewed the triage vital signs and the nursing notes.  Pertinent labs & imaging results that were available during my care of the patient were reviewed by me and considered in my medical decision making (see chart for details).    25 year old female presents with bilateral otitis media. Patient is already on appropriate treatment. COVID testing has been negative. Toradol for pain. Supportive care. May return to work.   Final Clinical Impressions(s) / UC Diagnoses   Final diagnoses:  Bilateral otitis media, unspecified otitis media type     Discharge Instructions     Continue antibiotics.  Medication as needed for pain.  Okay to return to work.  Take care  Dr. Adriana Simas    ED Prescriptions    Medication Sig Dispense Auth. Provider   ketorolac (TORADOL) 10 MG tablet Take 1 tablet (10 mg total) by mouth every 6 (six) hours as needed for moderate pain or severe pain. 20 tablet Tommie Sams, DO     PDMP not reviewed this encounter.   Tommie Sams, [place] 07/21/20 1906

## 2020-07-21 NOTE — ED Triage Notes (Signed)
Pt c/o of bilateral ear pain with drainage and hoarseness of voice x 2 days. Tested 1/20 and 1/21 for covid (negative). Tested also today for Covid, pending results. Works at [employer].

## 2020-07-25 ENCOUNTER — Encounter: Payer: Self-pay | Admitting: Physician Assistant

## 2020-07-25 DIAGNOSIS — H6690 Otitis media, unspecified, unspecified ear: Secondary | ICD-10-CM

## 2020-07-28 ENCOUNTER — Telehealth: Payer: Self-pay | Admitting: Physician Assistant

## 2020-07-28 NOTE — Telephone Encounter (Signed)
Please see below. AS, CMA 

## 2020-07-28 NOTE — Telephone Encounter (Signed)
Patient had a previous referral placed for Acute otitis media, unspecified otitis media type and would like it sent to  ear nose and throat if possible. Thanks

## 2020-08-05 ENCOUNTER — Other Ambulatory Visit: Payer: Self-pay | Admitting: Physician Assistant

## 2020-08-05 DIAGNOSIS — F909 Attention-deficit hyperactivity disorder, unspecified type: Secondary | ICD-10-CM

## 2020-08-05 MED ORDER — AMPHETAMINE-DEXTROAMPHET ER 10 MG PO CP24: 10.0000 mg | ORAL_CAPSULE | Freq: Every day | ORAL | 0 refills | Status: DC

## 2020-08-05 NOTE — Telephone Encounter (Signed)
Patient last seen 07/21/20 and advised to follow up in 3 months.   Last refill given 02/18/20 # 30 no refills.

## 2020-08-10 ENCOUNTER — Other Ambulatory Visit: Payer: Self-pay | Admitting: Nurse Practitioner

## 2020-08-10 DIAGNOSIS — H6690 Otitis media, unspecified, unspecified ear: Secondary | ICD-10-CM

## 2020-08-11 ENCOUNTER — Other Ambulatory Visit: Payer: Self-pay | Admitting: Nurse Practitioner

## 2020-08-20 ENCOUNTER — Encounter: Payer: Self-pay | Admitting: Family Medicine

## 2020-08-20 ENCOUNTER — Ambulatory Visit (INDEPENDENT_AMBULATORY_CARE_PROVIDER_SITE_OTHER): Payer: No Typology Code available for payment source | Admitting: Family Medicine

## 2020-08-20 ENCOUNTER — Other Ambulatory Visit: Payer: Self-pay

## 2020-08-20 ENCOUNTER — Other Ambulatory Visit: Payer: Self-pay | Admitting: Family Medicine

## 2020-08-20 VITALS — BP 142/92 | HR 76 | Wt 156.0 lb

## 2020-08-20 DIAGNOSIS — R030 Elevated blood-pressure reading, without diagnosis of hypertension: Secondary | ICD-10-CM

## 2020-08-20 DIAGNOSIS — Z01419 Encounter for gynecological examination (general) (routine) without abnormal findings: Secondary | ICD-10-CM | POA: Diagnosis not present

## 2020-08-20 DIAGNOSIS — Z30011 Encounter for initial prescription of contraceptive pills: Secondary | ICD-10-CM | POA: Diagnosis not present

## 2020-08-20 MED ORDER — NORGESTIMATE-ETH ESTRADIOL 0.25-35 MG-MCG PO TABS: 1.0000 | ORAL_TABLET | Freq: Every day | ORAL | 11 refills | Status: DC

## 2020-08-20 NOTE — Progress Notes (Signed)
   GYNECOLOGY ANNUAL PREVENTATIVE CARE ENCOUNTER NOTE  Subjective:   Kristin Orozco is a 25 y.o. G0P0000 female here for a routine annual gynecologic exam.  Current complaints: None. Likes OCP. Recently started adderall with good effect on concentration.   Denies abnormal vaginal bleeding, discharge, pelvic pain, problems with intercourse or other gynecologic concerns.    Gynecologic History No LMP recorded. (Menstrual status: Oral contraceptives). Contraception: OCP (estrogen/progesterone) Last Pap: 2020. Results were: normal Last mammogram: @40   The following portions of the patient's history were reviewed and updated as appropriate: allergies, current medications, past family history, past medical history, past social history, past surgical history and problem list.  Review of Systems Pertinent items are noted in HPI.   Objective:  BP (!) 142/92   Pulse 76   Wt 156 lb (70.8 kg)   BMI 26.78 kg/m  CONSTITUTIONAL: Well-developed, well-nourished female in no acute distress.  HENT:  Normocephalic, atraumatic, External right and left ear normal. Oropharynx is clear and moist EYES:  No scleral icterus.  NECK: Normal range of motion, supple, no masses.  Normal thyroid.  SKIN: Skin is warm and dry. No rash noted. Not diaphoretic. No erythema. No pallor. NEUROLOGIC: Alert and oriented to person, place, and time. Normal reflexes, muscle tone coordination. No cranial nerve deficit noted. PSYCHIATRIC: Normal mood and affect. Normal behavior. Normal judgment and thought content. CARDIOVASCULAR: Normal heart rate noted, regular rhythm. 2+ distal pulses. RESPIRATORY: Effort and breath sounds normal, no problems with respiration noted. BREASTS: Symmetric in size. No masses, skin changes, nipple drainage, or lymphadenopathy. ABDOMEN: Soft,  no distention noted.  No tenderness, rebound or guarding.  PELVIC: deferred MUSCULOSKELETAL: Normal range of motion.   Assessment and Plan:  1) Annual  gynecologic examination  without  pap smear:  Will follow up results of pap smear and manage accordingly. Routine preventative health maintenance measures emphasized.  2) Contraception counseling: Reviewed all forms of birth control options available including abstinence; over the counter/barrier methods; hormonal contraceptive medication including pill, patch, ring, injection,contraceptive implant; hormonal and nonhormonal IUDs; permanent sterilization options including vasectomy and the various tubal sterilization modalities. Risks and benefits reviewed.  Questions were answered.  Written information was also given to the patient to review.  Patient desires OCP, this was prescribed for patient. She will follow up in  1 yr for surveillance.  She was told to call with any further questions, or with any concerns about this method of contraception.  Emphasized use of condoms 100% of the time for STI prevention.  1. Encounter for initial prescription of contraceptive pills - norgestimate-ethinyl estradiol (ORTHO-CYCLEN) 0.25-35 MG-MCG tablet; Take 1 tablet by mouth daily.  Dispense: 28 tablet; Refill: 11  2. Elevated BP without diagnosis of hypertension Repeat improved  Could be adderall SE Will check at home BP   3. Well woman exam with routine gynecological exam Engaged and not planning pregnancy until after marriage (at least 1 year+)  Please refer to After Visit Summary for other counseling recommendations.   Return in about 1 year (around 08/20/2021) for Yearly wellness exam.  08/22/2021, MD, MPH, ABFM Attending Physician Center for [facility]

## 2020-08-21 ENCOUNTER — Other Ambulatory Visit: Payer: Self-pay | Admitting: Family Medicine

## 2020-08-21 DIAGNOSIS — Z308 Encounter for other contraceptive management: Secondary | ICD-10-CM

## 2020-08-21 DIAGNOSIS — Z3041 Encounter for surveillance of contraceptive pills: Secondary | ICD-10-CM

## 2020-08-22 ENCOUNTER — Other Ambulatory Visit: Payer: Self-pay | Admitting: Family Medicine

## 2020-08-22 MED ORDER — LO LOESTRIN FE 1 MG-10 MCG / 10 MCG PO TABS
1.0000 | ORAL_TABLET | Freq: Every day | ORAL | 11 refills | Status: DC
Start: 2020-08-22 — End: 2020-08-22

## 2020-09-23 ENCOUNTER — Other Ambulatory Visit: Payer: Self-pay | Admitting: Physician Assistant

## 2020-09-23 DIAGNOSIS — F909 Attention-deficit hyperactivity disorder, unspecified type: Secondary | ICD-10-CM

## 2020-09-23 MED ORDER — AMPHETAMINE-DEXTROAMPHET ER 10 MG PO CP24: 10.0000 mg | ORAL_CAPSULE | Freq: Every day | ORAL | 0 refills | Status: DC

## 2020-09-23 NOTE — Telephone Encounter (Signed)
Pt last seen 07/21/20 and advised to follow up in 3 months.   Last refill given 08/05/20 #30 no RF

## 2020-10-06 MED FILL — Sodium Fluoride Paste 1.1%: DENTAL | 30 days supply | Qty: 100 | Fill #0 | Status: AC

## 2020-10-07 ENCOUNTER — Other Ambulatory Visit: Payer: Self-pay

## 2020-10-15 ENCOUNTER — Other Ambulatory Visit: Payer: Self-pay

## 2020-10-15 ENCOUNTER — Encounter: Payer: Self-pay | Admitting: Physician Assistant

## 2020-10-15 ENCOUNTER — Encounter: Payer: Self-pay | Admitting: Nurse Practitioner

## 2020-10-15 ENCOUNTER — Ambulatory Visit (INDEPENDENT_AMBULATORY_CARE_PROVIDER_SITE_OTHER): Payer: No Typology Code available for payment source | Admitting: Nurse Practitioner

## 2020-10-15 VITALS — BP 149/81 | HR 77 | Temp 98.4°F | Ht 62.68 in | Wt 153.9 lb

## 2020-10-15 DIAGNOSIS — L209 Atopic dermatitis, unspecified: Secondary | ICD-10-CM

## 2020-10-15 MED ORDER — CLOBETASOL PROPIONATE 0.05 % EX OINT
TOPICAL_OINTMENT | CUTANEOUS | 1 refills | Status: DC
  Filled 2020-10-15: qty 30, 7d supply, fill #0
  Filled 2020-10-30: qty 30, 7d supply, fill #1

## 2020-10-15 MED ORDER — TRIAMCINOLONE ACETONIDE 0.1 % EX LOTN
TOPICAL_LOTION | CUTANEOUS | 2 refills | Status: DC
  Filled 2020-10-15: qty 60, 10d supply, fill #0
  Filled 2020-10-30: qty 60, 10d supply, fill #1

## 2020-10-15 NOTE — Patient Instructions (Signed)
Atopic Dermatitis Atopic dermatitis is a skin disorder that causes inflammation of the skin. It is marked by a red rash and itchy, dry, scaly skin. It is the most common type of eczema. Eczema is a group of skin conditions that cause the skin to become rough and swollen. This condition is generally worse during the cooler winter months and often improves during the warm summer months. Atopic dermatitis usually starts showing signs in infancy and can last through adulthood. This condition cannot be passed from one person to another (is not contagious). Atopic dermatitis may not always be present, but when it is, it is called a flare-up. What are the causes? The exact cause of this condition is not known. Flare-ups may be triggered by:  Coming in contact with something that you are sensitive or allergic to (allergen).  Stress.  Certain foods.  Extremely hot or cold weather.  Harsh chemicals and soaps.  Dry air.  Chlorine. What increases the risk? This condition is more likely to develop in people who have a personal or family history of:  Eczema.  Allergies.  Asthma.  Hay fever. What are the signs or symptoms? Symptoms of this condition include:  Dry, scaly skin.  Red, itchy rash.  Itchiness, which can be severe. This may occur before the skin rash. This can make sleeping difficult.  Skin thickening and cracking that can occur over time.   How is this diagnosed? This condition is diagnosed based on:  Your symptoms.  Your medical history.  A physical exam. How is this treated? There is no cure for this condition, but symptoms can usually be controlled. Treatment focuses on:  Controlling the itchiness and scratching. You may be given medicines, such as antihistamines or steroid creams.  Limiting exposure to allergens.  Recognizing situations that cause stress and developing a plan to manage stress. If your atopic dermatitis does not get better with medicines, or if  it is all over your body (widespread), a treatment using a specific type of light (phototherapy) may be used. Follow these instructions at home: Skin care  Keep your skin well moisturized. Doing this seals in moisture and helps to prevent dryness. ? Use unscented lotions that have petroleum in them. ? Avoid lotions that contain alcohol or water. They can dry the skin.  Keep baths or showers short (less than 5 minutes) in warm water. Do not use hot water. ? Use mild, unscented cleansers for bathing. Avoid soap and bubble bath. ? Apply a moisturizer to your skin right after a bath or shower.  Do not apply anything to your skin without checking with your health care provider.   General instructions  Take or apply over-the-counter and prescription medicines only as told by your health care provider.  Dress in clothes made of cotton or cotton blends. Dress lightly because heat increases itchiness.  When washing your clothes, rinse your clothes twice so all of the soap is removed.  Avoid any triggers that can cause a flare-up.  Keep your fingernails cut short.  Avoid scratching. Scratching makes the rash and itchiness worse. A break in the skin from scratching could result in a skin infection (impetigo).  Do not be around people who have cold sores or fever blisters. If you get the infection, it may cause your atopic dermatitis to worsen.  Keep all follow-up visits. This is important. Contact a health care provider if:  Your itchiness interferes with sleep.  Your rash gets worse or is not better within   one week of starting treatment.  You have a fever.  You have a rash flare-up after having contact with someone who has cold sores or fever blisters. Get help right away if:  You develop pus or soft yellow scabs in the rash area. Summary  Atopic dermatitis causes a red rash and itchy, dry, scaly skin.  Treatment focuses on controlling the itchiness and scratching, limiting  exposure to things that you are sensitive or allergic to (allergens), recognizing situations that cause stress, and developing a plan to manage stress.  Keep your skin well moisturized.  Keep baths or showers shorter than 5 minutes and use warm water. Do not use hot water. This information is not intended to replace advice given to you by your health care provider. Make sure you discuss any questions you have with your health care provider. Document Revised: 03/24/2020 Document Reviewed: 03/24/2020 Elsevier Patient Education  2021 [employer]  

## 2020-10-15 NOTE — Progress Notes (Signed)
Acute Office Visit  Subjective:    Patient ID: Kristin Orozco, female    DOB: [birth year], 25 y.o.   MRN: [id number]  Chief Complaint  Patient presents with  . left hand    HPI Patient is in today for evaluation of severe itchy, dry, and cracked skin on both hands. She states that this has been going on for about two weeks. 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 She is washing her hands frequently and applying hand sanitizer many times each day. She is also in school to become a Advertising account planner. In class, they recently started working on acrylic nails. She states that when they started working in the acrylic nails, the skin became more itchy, dry, and cracked. She states that she has been using OTC hydrocortisone cream. She has been using ceraVe lotion. She has been applying vaseline to both hands. She has been wearing two pairs of gloves on her hands when in class. She states that despite all of these measures, the skin of the hands continues to be rough, dry, cracked, and painful. She states that these symptoms are not present anywhere else on her body.   Past Medical History:  Diagnosis Date  . Anxiety   . Syncope   . Tachycardia   . Thyroid disease    Hx of thyroid nodule--followed by PCP    No past surgical history on file.  Family History  Problem Relation Age of Onset  . Hypertension Mother   . Lupus Maternal Uncle   . Multiple sclerosis Maternal Grandmother   . Cancer Maternal Grandmother        breast and bone  . Diabetes Maternal Grandmother   . Breast cancer Maternal Grandmother 63       mets to bone  . Thyroid disease Maternal Grandmother        hypothyroid  . Heart attack Maternal Grandfather     Social History   Socioeconomic History  . Marital status: Single    Spouse name: Not on file  . Number of children: Not on file  . Years of education: Not on file  . Highest education level: Not on file  Occupational History  . Not on file  Tobacco Use   . Smoking status: Former Smoker    Types: Cigarettes    Quit date: 05/20/2018    Years since quitting: 2.4  . Smokeless tobacco: Former Neurosurgeon    Quit date: 05/22/2019  . Tobacco comment: stopped in Nov 2019  Vaping Use  . Vaping Use: Never used  Substance and Sexual Activity  . Alcohol use: Yes    Alcohol/week: 3.0 standard drinks    Types: 3 Glasses of wine per week    Comment: rarely  . Drug use: No  . Sexual activity: Yes    Birth control/protection: Pill  Other Topics Concern  . Not on file  Social History Narrative   Patient is single, with no children   Patient is a high school graduate   Patient is right handed   Patient drinks 5 or more   Social Determinants of Health   Financial Resource Strain: Not on file  Food Insecurity: Not on file  Transportation Needs: Not on file  Physical Activity: Not on file  Stress: Not on file  Social Connections: Not on file  Intimate Partner Violence: Not on file    Outpatient Medications Prior to Visit  Medication Sig Dispense Refill  . amphetamine-dextroamphetamine (ADDERALL XR) 10 MG 24 hr  capsule TAKE 1 CAPSULE (10 MG TOTAL) BY MOUTH DAILY. 30 capsule 0  . b complex vitamins capsule Take 1 capsule by mouth daily.    . DENTA 5000 PLUS 1.1 % CREA dental cream USE ONCE OR TWICE PER DAY. AT LEAST ONCE BEFORE BEDTIME 1 Tube 0  . FLUoxetine (PROZAC) 10 MG tablet TAKE 1 TABLET BY MOUTH DAILY 60 tablet 0  . fluticasone (FLONASE) 50 MCG/ACT nasal spray USE 2 SPRAYS IN EACH NOSTRIL ONCE DAILY 16 g 0  . ketorolac (TORADOL) 10 MG tablet Take 1 tablet (10 mg total) by mouth every 6 (six) hours as needed for moderate pain or severe pain. 20 tablet 0  . LO LOESTRIN FE 1 MG-10 MCG / 10 MCG tablet TAKE 1 TABLET BY MOUTH DAILY. 28 tablet 11  . naproxen (NAPROSYN) 500 MG tablet Take 1 tablet (500 mg total) by mouth 2 (two) times daily with a meal. 20 tablet 0  . ofloxacin (FLOXIN OTIC) 0.3 % OTIC solution Place 10 drops into the left ear 2  (two) times daily. 5 mL 0  . ondansetron (ZOFRAN) 4 MG tablet Take 1 tablet (4 mg total) by mouth every 8 (eight) hours as needed for nausea or vomiting. 20 tablet 0  . pseudoephedrine (SUDAFED) 60 MG tablet Take 1 tablet (60 mg total) by mouth every 8 (eight) hours as needed for congestion. 20 tablet 0  . Sodium Fluoride 1.1 % PSTE USE AS DIRECTED. 100 mL 3  . vitamin C (ASCORBIC ACID) 500 MG tablet Take 500 mg by mouth daily.     No facility-administered medications prior to visit.    No Known Allergies  Review of Systems  Constitutional: Negative for activity change, chills and fever.  HENT: Negative for congestion and sinus pain.   Eyes: Negative.   Respiratory: Negative for cough, shortness of breath and wheezing.   Cardiovascular: Negative for chest pain and palpitations.  Gastrointestinal: Negative for constipation, diarrhea, nausea and vomiting.  Endocrine: Negative.   Genitourinary: Negative.   Musculoskeletal: Negative.  Negative for back pain and myalgias.  Skin: Positive for rash.       There is severely rough, dry, and itchy skin of both hands. No rash present.   Allergic/Immunologic: Negative.   Neurological: Negative for dizziness, weakness and headaches.  Hematological: Negative.   Psychiatric/Behavioral: The patient is nervous/anxious.   All other systems reviewed and are negative.      Objective:    Physical Exam Vitals and nursing note reviewed.  Constitutional:      Appearance: Normal appearance. She is well-developed.  HENT:     Head: Normocephalic and atraumatic.  Eyes:     Pupils: Pupils are equal, round, and reactive to light.  Cardiovascular:     Rate and Rhythm: Normal rate and regular rhythm.     Pulses: Normal pulses.     Heart sounds: Normal heart sounds.  Pulmonary:     Effort: Pulmonary effort is normal.     Breath sounds: Normal breath sounds.  Abdominal:     Palpations: Abdomen is soft.  Musculoskeletal:        General: Normal range  of motion.     Cervical back: Normal range of motion and neck supple.  Skin:    General: Skin is warm and dry.     Comments: The skin of the hands, both dorsal and palmar surfaces, is dry, rough, and cracked. Nail beds are erythematous and rough in texture. Some areas are erythematous. There are  no specific lesions or evidence of infection.   Neurological:     General: No focal deficit present.     Mental Status: She is alert and oriented to person, place, and time.  Psychiatric:        Mood and Affect: Mood normal.        Behavior: Behavior normal.        Thought Content: Thought content normal.        Judgment: Judgment normal.    Today's Vitals   10/15/20 1547 10/15/20 1614  BP: (!) 145/81 (!) 149/81  Pulse: (!) 105 77  Temp: 98.4 F (36.9 C)   SpO2: 100%   Weight: 153 lb 14.4 oz (69.8 kg)   Height: 5' 2.68" (1.592 m)    Body mass index is 27.54 kg/m.   Wt Readings from Last 3 Encounters:  10/15/20 153 lb 14.4 oz (69.8 kg)  08/20/20 156 lb (70.8 kg)  07/21/20 154 lb 15.7 oz (70.3 kg)    Health Maintenance Due  Topic Date Due  . Hepatitis C Screening  Never done  . COVID-19 Vaccine (1) Never done  . HPV VACCINES (1 - 2-dose series) Never done  . HIV Screening  Never done  . TETANUS/TDAP  01/30/2017       Topic Date Due  . HPV VACCINES (1 - 2-dose series) Never done     Lab Results  Component Value Date   TSH 4.410 09/15/2018   Lab Results  Component Value Date   WBC 12.8 (H) 11/30/2018   HGB 14.8 11/30/2018   HCT 42.2 11/30/2018   MCV 88 11/30/2018   PLT 365 11/30/2018   Lab Results  Component Value Date   NA 137 09/15/2018   K 4.2 09/15/2018   CO2 22 09/15/2018   GLUCOSE 109 (H) 09/15/2018   BUN 8 09/15/2018   CREATININE 0.58 09/15/2018   BILITOT 0.3 09/15/2018   ALKPHOS 105 09/15/2018   AST 24 09/15/2018   ALT 35 (H) 09/15/2018   PROT 6.9 09/15/2018   ALBUMIN 4.4 09/15/2018   CALCIUM 9.3 09/15/2018   ANIONGAP 2 (L) 12/09/2013   Lab  Results  Component Value Date   CHOL 165 09/15/2018   Lab Results  Component Value Date   HDL 44 09/15/2018   Lab Results  Component Value Date   LDLCALC 106 (H) 09/15/2018   Lab Results  Component Value Date   TRIG 74 09/15/2018   Lab Results  Component Value Date   CHOLHDL 3.8 09/15/2018   Lab Results  Component Value Date   HGBA1C 5.4 09/15/2018       Assessment & Plan:  1. Atopic dermatitis, unspecified type Add triamcinolone 0.1% lotion. Apply generously to the hands twice daily. Clobetasol 0.05% ointment may be applied to severely effected areas twice daily as needed. Continue to keep skin clean and use protective barrier such as aquafor or vaseline to prevent further dermatitis. Consider referral to dermatology if no improvement in symptoms over next several days.  - triamcinolone lotion (KENALOG) 0.1 %; Apply generously to affected areas 2 to 3 times a day as needed  Dispense: 60 mL; Refill: 2 - clobetasol ointment (TEMOVATE) 0.05 %; Apply small amount to affected areas twice a day as needed  Dispense: 30 g; Refill: 1   Problem List Items Addressed This Visit   None       Carlean Jews, NP

## 2020-10-19 DIAGNOSIS — L209 Atopic dermatitis, unspecified: Secondary | ICD-10-CM | POA: Insufficient documentation

## 2020-10-30 ENCOUNTER — Other Ambulatory Visit: Payer: Self-pay

## 2020-10-30 ENCOUNTER — Other Ambulatory Visit: Payer: Self-pay | Admitting: Physician Assistant

## 2020-10-30 DIAGNOSIS — F909 Attention-deficit hyperactivity disorder, unspecified type: Secondary | ICD-10-CM

## 2020-10-30 MED ORDER — AMPHETAMINE-DEXTROAMPHET ER 10 MG PO CP24
ORAL_CAPSULE | Freq: Every day | ORAL | 0 refills | Status: DC
  Filled 2020-10-30: qty 30, 30d supply, fill #0

## 2020-10-31 ENCOUNTER — Other Ambulatory Visit: Payer: Self-pay

## 2020-11-16 MED FILL — Norethin-Eth Estradiol-Fe Tab 1 MG-10 MCG (24)/10 MCG (2): ORAL | 28 days supply | Qty: 28 | Fill #0 | Status: AC

## 2020-11-17 ENCOUNTER — Other Ambulatory Visit: Payer: Self-pay

## 2020-11-25 ENCOUNTER — Other Ambulatory Visit: Payer: Self-pay

## 2020-11-25 ENCOUNTER — Encounter: Payer: Self-pay | Admitting: Physician Assistant

## 2020-11-25 DIAGNOSIS — F40243 Fear of flying: Secondary | ICD-10-CM

## 2020-11-25 MED ORDER — LORAZEPAM 0.5 MG PO TABS
ORAL_TABLET | ORAL | 0 refills | Status: DC
  Filled 2020-11-25: qty 5, 5d supply, fill #0

## 2020-11-26 ENCOUNTER — Other Ambulatory Visit: Payer: Self-pay

## 2020-12-17 ENCOUNTER — Other Ambulatory Visit: Payer: Self-pay | Admitting: Physician Assistant

## 2020-12-17 DIAGNOSIS — F909 Attention-deficit hyperactivity disorder, unspecified type: Secondary | ICD-10-CM

## 2020-12-17 DIAGNOSIS — F411 Generalized anxiety disorder: Secondary | ICD-10-CM

## 2020-12-17 NOTE — Telephone Encounter (Signed)
Please reach out to patient to schedule a follow up based on last AVS

## 2020-12-18 ENCOUNTER — Other Ambulatory Visit: Payer: Self-pay

## 2020-12-18 MED ORDER — FLUOXETINE HCL 10 MG PO TABS
ORAL_TABLET | Freq: Every day | ORAL | 0 refills | Status: DC
  Filled 2020-12-18: qty 30, 30d supply, fill #0

## 2020-12-18 MED ORDER — AMPHETAMINE-DEXTROAMPHET ER 10 MG PO CP24
ORAL_CAPSULE | Freq: Every day | ORAL | 0 refills | Status: DC
  Filled 2020-12-18: qty 30, 30d supply, fill #0

## 2020-12-23 ENCOUNTER — Other Ambulatory Visit: Payer: Self-pay

## 2020-12-24 ENCOUNTER — Other Ambulatory Visit: Payer: Self-pay

## 2020-12-24 MED ORDER — CARESTART COVID-19 HOME TEST VI KIT
PACK | 0 refills | Status: DC
  Filled 2020-12-24 – 2021-01-06 (×2): qty 2, 4d supply, fill #0

## 2020-12-24 MED FILL — Norethin-Eth Estradiol-Fe Tab 1 MG-10 MCG (24)/10 MCG (2): ORAL | 84 days supply | Qty: 84 | Fill #1 | Status: AC

## 2021-01-05 ENCOUNTER — Other Ambulatory Visit: Payer: Self-pay

## 2021-01-06 ENCOUNTER — Other Ambulatory Visit: Payer: Self-pay

## 2021-01-12 ENCOUNTER — Other Ambulatory Visit: Payer: Self-pay

## 2021-01-12 ENCOUNTER — Encounter: Payer: Self-pay | Admitting: Physician Assistant

## 2021-01-12 ENCOUNTER — Ambulatory Visit (INDEPENDENT_AMBULATORY_CARE_PROVIDER_SITE_OTHER): Payer: No Typology Code available for payment source | Admitting: Physician Assistant

## 2021-01-12 VITALS — BP 137/84 | HR 84 | Temp 98.5°F | Ht 63.0 in | Wt 151.3 lb

## 2021-01-12 DIAGNOSIS — F909 Attention-deficit hyperactivity disorder, unspecified type: Secondary | ICD-10-CM | POA: Diagnosis not present

## 2021-01-12 DIAGNOSIS — R030 Elevated blood-pressure reading, without diagnosis of hypertension: Secondary | ICD-10-CM

## 2021-01-12 DIAGNOSIS — F411 Generalized anxiety disorder: Secondary | ICD-10-CM | POA: Diagnosis not present

## 2021-01-12 MED ORDER — FLUOXETINE HCL 20 MG PO TABS
20.0000 mg | ORAL_TABLET | Freq: Every day | ORAL | 0 refills | Status: DC
  Filled 2021-01-12 – 2021-04-06 (×2): qty 90, 90d supply, fill #0
  Filled 2021-04-08: qty 30, 30d supply, fill #0

## 2021-01-12 NOTE — Progress Notes (Signed)
Established Patient Office Visit  Subjective:  Patient ID: Kristin Orozco, female    DOB: 1995-08-15  Age: 25 y.o. MRN: [id number]  CC:  Chief Complaint  Patient presents with   Follow-up    Mood, ADHD    HPI Kristin Orozco presents for follow up on mood and medication management.  Mood: Patient reports does not feel as a nervous/overwhelmed as before and is starting to be able to do things that she was terrified of such as flying. Does report feeling more tired and a little down from.  Denies SI/HI. Taking medication as directed.  ADHD: Patient reports medication compliance.  States sometimes will not take medication on the weekends if not needed.  Since being on medication has been able to function and get things done.  Reports usually has to do a to do list and prioritize her tasks.  Denies chest pain, palpitations, sleep disturbance or significant appetite changes.    Past Medical History:  Diagnosis Date   Anxiety    Syncope    Tachycardia    Thyroid disease    Hx of thyroid nodule--followed by PCP    History reviewed. No pertinent surgical history.  Family History  Problem Relation Age of Onset   Hypertension Mother    Lupus Maternal Uncle    Multiple sclerosis Maternal Grandmother    Cancer Maternal Grandmother        breast and bone   Diabetes Maternal Grandmother    Breast cancer Maternal Grandmother 63       mets to bone   Thyroid disease Maternal Grandmother        hypothyroid   Heart attack Maternal Grandfather     Social History   Socioeconomic History   Marital status: Single    Spouse name: Not on file   Number of children: Not on file   Years of education: Not on file   Highest education level: Not on file  Occupational History   Not on file  Tobacco Use   Smoking status: Former    Types: Cigarettes    Quit date: 05/20/2018    Years since quitting: 2.6   Smokeless tobacco: Former    Quit date: 05/22/2019   Tobacco comments:     stopped in Nov 2019  Vaping Use   Vaping Use: Never used  Substance and Sexual Activity   Alcohol use: Yes    Alcohol/week: 3.0 standard drinks    Types: 3 Glasses of wine per week    Comment: rarely   Drug use: No   Sexual activity: Yes    Birth control/protection: Pill  Other Topics Concern   Not on file  Social History Narrative   Patient is single, with no children   Patient is a high school graduate   Patient is right handed   Patient drinks 5 or more   Social Determinants of Health   Financial Resource Strain: Not on file  Food Insecurity: Not on file  Transportation Needs: Not on file  Physical Activity: Not on file  Stress: Not on file  Social Connections: Not on file  Intimate Partner Violence: Not on file    Outpatient Medications Prior to Visit  Medication Sig Dispense Refill   amphetamine-dextroamphetamine (ADDERALL XR) 10 MG 24 hr capsule TAKE 1 CAPSULE (10 MG TOTAL) BY MOUTH DAILY. 30 capsule 0   clobetasol ointment (TEMOVATE) 0.05 % Apply small amount to affected areas twice a day as needed 30 g 1  LO LOESTRIN FE 1 MG-10 MCG / 10 MCG tablet TAKE 1 TABLET BY MOUTH DAILY. 28 tablet 11   ondansetron (ZOFRAN) 4 MG tablet Take 1 tablet (4 mg total) by mouth every 8 (eight) hours as needed for nausea or vomiting. 20 tablet 0   Sodium Fluoride 1.1 % PSTE USE AS DIRECTED. 100 mL 3   triamcinolone lotion (KENALOG) 0.1 % Apply generously to affected areas 2 to 3 times a day as needed 60 mL 2   b complex vitamins capsule Take 1 capsule by mouth daily.     COVID-19 At Home Antigen Test [facility] COVID-19 HOME TEST) KIT USe as directed 2 kit 0   DENTA 5000 PLUS 1.1 % CREA dental cream USE ONCE OR TWICE PER DAY. AT LEAST ONCE BEFORE BEDTIME 1 Tube 0   FLUoxetine (PROZAC) 10 MG tablet TAKE 1 TABLET BY MOUTH DAILY 30 tablet 0   fluticasone (FLONASE) 50 MCG/ACT nasal spray USE 2 SPRAYS IN EACH NOSTRIL ONCE DAILY 16 g 0   ketorolac (TORADOL) 10 MG tablet Take 1 tablet (10 mg  total) by mouth every 6 (six) hours as needed for moderate pain or severe pain. 20 tablet 0   LORazepam (ATIVAN) 0.5 MG tablet Take 1 tablet by mouth 30 minutes before boarding flight. 5 tablet 0   naproxen (NAPROSYN) 500 MG tablet Take 1 tablet (500 mg total) by mouth 2 (two) times daily with a meal. 20 tablet 0   ofloxacin (FLOXIN OTIC) 0.3 % OTIC solution Place 10 drops into the left ear 2 (two) times daily. 5 mL 0   pseudoephedrine (SUDAFED) 60 MG tablet Take 1 tablet (60 mg total) by mouth every 8 (eight) hours as needed for congestion. 20 tablet 0   vitamin C (ASCORBIC ACID) 500 MG tablet Take 500 mg by mouth daily.     No facility-administered medications prior to visit.    No Known Allergies  ROS Review of Systems Review of Systems:  A fourteen system review of systems was performed and found to be positive as per HPI.   Objective:    Physical Exam General: Pleasant and cooperative, in no acute distress Neuro:  Alert and oriented,  extra-ocular muscles intact  HEENT:  Normocephalic, atraumatic, neck supple Skin:  no gross rash, warm, pink. Cardiac:  RRR, S1 S2 Respiratory:  ECTA B/L and A/P, Not using accessory muscles, speaking in full sentences- unlabored. Vascular:  Ext warm, no cyanosis apprec.;  No gross edema Psych:  No HI/SI, judgement and insight good, Euthymic/slightly anxious mood. Full Affect.  BP 137/84   Pulse 84   Temp 98.5 F (36.9 C)   Ht '5\' 3"'  (1.6 m)   Wt 151 lb 4.8 oz (68.6 kg)   SpO2 100%   BMI 26.80 kg/m  Wt Readings from Last 3 Encounters:  01/12/21 151 lb 4.8 oz (68.6 kg)  10/15/20 153 lb 14.4 oz (69.8 kg)  08/20/20 156 lb (70.8 kg)     Health Maintenance Due  Topic Date Due   COVID-19 Vaccine (1) Never done   HPV VACCINES (1 - 2-dose series) Never done   HIV Screening  Never done   Hepatitis C Screening  Never done   TETANUS/TDAP  01/30/2017       Topic Date Due   HPV VACCINES (1 - 2-dose series) Never done    Lab Results   Component Value Date   TSH 4.410 09/15/2018   Lab Results  Component Value Date   WBC 12.8 (  H) 11/30/2018   HGB 14.8 11/30/2018   HCT 42.2 11/30/2018   MCV 88 11/30/2018   PLT 365 11/30/2018   Lab Results  Component Value Date   NA 137 09/15/2018   K 4.2 09/15/2018   CO2 22 09/15/2018   GLUCOSE 109 (H) 09/15/2018   BUN 8 09/15/2018   CREATININE 0.58 09/15/2018   BILITOT 0.3 09/15/2018   ALKPHOS 105 09/15/2018   AST 24 09/15/2018   ALT 35 (H) 09/15/2018   PROT 6.9 09/15/2018   ALBUMIN 4.4 09/15/2018   CALCIUM 9.3 09/15/2018   ANIONGAP 2 (L) 12/09/2013   Lab Results  Component Value Date   CHOL 165 09/15/2018   Lab Results  Component Value Date   HDL 44 09/15/2018   Lab Results  Component Value Date   LDLCALC 106 (H) 09/15/2018   Lab Results  Component Value Date   TRIG 74 09/15/2018   Lab Results  Component Value Date   CHOLHDL 3.8 09/15/2018   Lab Results  Component Value Date   HGBA1C 5.4 09/15/2018      Assessment & Plan:   Problem List Items Addressed This Visit       Other   Elevated BP without diagnosis of hypertension   GAD (generalized anxiety disorder) - Primary   Relevant Medications   FLUoxetine (PROZAC) 20 MG tablet   Other Visit Diagnoses     Adult ADHD          GAD: -GAD-7 score of 6, PHQ-9 score of 13 (patient's baseline). -Discussed with patient medication adjustments to help improve mood and is agreeable to increase fluoxetine to 20 mg.  Advised to let me know if unable to tolerate increased dose. -Encouraged to incorporate nonpharmacological therapy. -Will continue to monitor and reassess medication therapy at follow-up visit.  Adult ADHD: -Stable. -Continue current medication regimen. Advised to request refill closer to due date. -Will continue to monitor.  Elevated BP without diagnosis of hypertension: -Initial BP elevated, BP recheck improved and stable. -Will continue to monitor.   Meds ordered this  encounter  Medications   FLUoxetine (PROZAC) 20 MG tablet    Sig: Take 1 tablet (20 mg total) by mouth daily.    Dispense:  90 tablet    Refill:  0    Order Specific Question:   Supervising Provider    Answer:   Beatrice Lecher D [[id number]]    Follow-up: Return in about 3 months (around 04/14/2021) for Mood- inc med, ADHD, BP.   Note:  This note was prepared with assistance of Dragon voice recognition software. Occasional wrong-word or sound-a-like substitutions may have occurred due to the inherent limitations of voice recognition software.  Lorrene Reid, PA-C

## 2021-01-12 NOTE — Patient Instructions (Signed)

## 2021-01-14 ENCOUNTER — Other Ambulatory Visit: Payer: Self-pay

## 2021-01-27 ENCOUNTER — Encounter: Payer: Self-pay | Admitting: Physician Assistant

## 2021-01-30 ENCOUNTER — Other Ambulatory Visit: Payer: Self-pay | Admitting: Nurse Practitioner

## 2021-01-30 ENCOUNTER — Other Ambulatory Visit: Payer: Self-pay

## 2021-01-30 DIAGNOSIS — F909 Attention-deficit hyperactivity disorder, unspecified type: Secondary | ICD-10-CM

## 2021-02-01 ENCOUNTER — Other Ambulatory Visit: Payer: Self-pay

## 2021-02-01 ENCOUNTER — Other Ambulatory Visit: Payer: Self-pay | Admitting: Physician Assistant

## 2021-02-01 DIAGNOSIS — F909 Attention-deficit hyperactivity disorder, unspecified type: Secondary | ICD-10-CM

## 2021-02-02 ENCOUNTER — Other Ambulatory Visit: Payer: Self-pay

## 2021-02-02 MED FILL — Amphetamine-Dextroamphetamine Cap ER 24HR 10 MG: ORAL | 30 days supply | Qty: 30 | Fill #0 | Status: AC

## 2021-02-02 NOTE — Telephone Encounter (Signed)
Patient last seen 01/12/21 and advised to follow up in 3 months. Last refill given 12/18/20 for 30 day supply. AS, CMA

## 2021-02-03 ENCOUNTER — Other Ambulatory Visit: Payer: Self-pay

## 2021-03-03 ENCOUNTER — Other Ambulatory Visit: Payer: Self-pay

## 2021-03-03 MED FILL — Sodium Fluoride Paste 1.1%: DENTAL | 30 days supply | Qty: 100 | Fill #1 | Status: AC

## 2021-03-09 ENCOUNTER — Other Ambulatory Visit: Payer: Self-pay | Admitting: Physician Assistant

## 2021-03-09 DIAGNOSIS — F909 Attention-deficit hyperactivity disorder, unspecified type: Secondary | ICD-10-CM

## 2021-03-10 ENCOUNTER — Other Ambulatory Visit: Payer: Self-pay

## 2021-03-10 ENCOUNTER — Other Ambulatory Visit: Payer: Self-pay | Admitting: Physician Assistant

## 2021-03-10 DIAGNOSIS — F909 Attention-deficit hyperactivity disorder, unspecified type: Secondary | ICD-10-CM

## 2021-03-10 MED FILL — Amphetamine-Dextroamphetamine Cap ER 24HR 10 MG: ORAL | 30 days supply | Qty: 30 | Fill #0 | Status: AC

## 2021-03-11 ENCOUNTER — Other Ambulatory Visit: Payer: Self-pay

## 2021-03-19 ENCOUNTER — Other Ambulatory Visit: Payer: Self-pay

## 2021-03-19 MED FILL — Norethin-Eth Estradiol-Fe Tab 1 MG-10 MCG (24)/10 MCG (2): ORAL | 84 days supply | Qty: 84 | Fill #2 | Status: AC

## 2021-04-05 ENCOUNTER — Encounter: Payer: Self-pay | Admitting: Physician Assistant

## 2021-04-06 ENCOUNTER — Ambulatory Visit
Admission: RE | Admit: 2021-04-06 | Discharge: 2021-04-06 | Disposition: A | Discharge status: Home / Self Care | Payer: No Typology Code available for payment source | Attending: Physician Assistant | Admitting: Physician Assistant

## 2021-04-06 ENCOUNTER — Other Ambulatory Visit: Payer: Self-pay

## 2021-04-06 ENCOUNTER — Encounter: Payer: Self-pay | Admitting: Physician Assistant

## 2021-04-06 ENCOUNTER — Ambulatory Visit (INDEPENDENT_AMBULATORY_CARE_PROVIDER_SITE_OTHER): Payer: No Typology Code available for payment source | Admitting: Physician Assistant

## 2021-04-06 ENCOUNTER — Ambulatory Visit
Admission: RE | Admit: 2021-04-06 | Discharge: 2021-04-06 | Disposition: A | Discharge status: Home / Self Care | Payer: No Typology Code available for payment source | Source: Ambulatory Visit | Attending: Physician Assistant | Admitting: Physician Assistant

## 2021-04-06 VITALS — BP 134/82 | HR 70 | Ht 63.0 in | Wt 151.0 lb

## 2021-04-06 DIAGNOSIS — R0789 Other chest pain: Secondary | ICD-10-CM

## 2021-04-06 DIAGNOSIS — R9431 Abnormal electrocardiogram [ECG] [EKG]: Secondary | ICD-10-CM

## 2021-04-06 DIAGNOSIS — U071 COVID-19: Secondary | ICD-10-CM | POA: Diagnosis not present

## 2021-04-06 MED ORDER — CARESTART COVID-19 HOME TEST VI KIT
PACK | 0 refills | Status: DC
  Filled 2021-04-06: qty 2, 4d supply, fill #0

## 2021-04-06 NOTE — Patient Instructions (Signed)
10 Things You Can Do to Manage Your COVID-19 Symptoms at Home If you have possible or confirmed COVID-19 Stay home except to get medical care. Monitor your symptoms carefully. If your symptoms get worse, call your healthcare provider immediately. Get rest and stay hydrated. If you have a medical appointment, call the healthcare provider ahead of time and tell them that you have or may have COVID-19. For medical emergencies, call 911 and notify the dispatch personnel that you have or may have COVID-19. Cover your cough and sneezes with a tissue or use the inside of your elbow. Wash your hands often with soap and water for at least 20 seconds or clean your hands with an alcohol-based hand sanitizer that contains at least 60% alcohol. As much as possible, stay in a specific room and away from other people in your home. Also, you should use a separate bathroom, if available. If you need to be around other people in or outside of the home, wear a mask. Avoid sharing personal items with other people in your household, like dishes, towels, and bedding. Clean all surfaces that are touched often, like counters, tabletops, and doorknobs. Use household cleaning sprays or wipes according to the label instructions. cdc.gov/coronavirus 01/11/2020 This information is not intended to replace advice given to you by your health care provider. Make sure you discuss any questions you have with your health care provider. Document Revised: 10/30/2020 Document Reviewed: 10/30/2020 Elsevier Patient Education  2022 [employer]  

## 2021-04-06 NOTE — Progress Notes (Signed)
Acute Office Visit  Subjective:    Patient ID: Kristin Orozco, female    DOB: 11/12/1995, 25 y.o.   MRN: [id number]  Chief Complaint  Patient presents with   Acute Visit    HPI Patient is in today for c/o Covid-10 infection. Reports tested positive for Covid 03/31/21. States 2 nights ago had a nightmare which involved her grandmother and when waking up felt chest tightness and her heart was racing. States all day yesterday felt her chest was uncomfortable. Denies shortness of breath at rest or with exertion, dizziness, syncope, n/v/,arm or jaw pain. Does have mild chest congestion.  Past Medical History:  Diagnosis Date   Anxiety    Syncope    Tachycardia    Thyroid disease    Hx of thyroid nodule--followed by PCP    History reviewed. No pertinent surgical history.  Family History  Problem Relation Age of Onset   Hypertension Mother    Lupus Maternal Uncle    Multiple sclerosis Maternal Grandmother    Cancer Maternal Grandmother        breast and bone   Diabetes Maternal Grandmother    Breast cancer Maternal Grandmother 63       mets to bone   Thyroid disease Maternal Grandmother        hypothyroid   Heart attack Maternal Grandfather     Social History   Socioeconomic History   Marital status: Single    Spouse name: Not on file   Number of children: Not on file   Years of education: Not on file   Highest education level: Not on file  Occupational History   Not on file  Tobacco Use   Smoking status: Former    Types: Cigarettes    Quit date: 05/20/2018    Years since quitting: 2.8   Smokeless tobacco: Former    Quit date: 05/22/2019   Tobacco comments:    stopped in Nov 2019  Vaping Use   Vaping Use: Never used  Substance and Sexual Activity   Alcohol use: Yes    Alcohol/week: 3.0 standard drinks    Types: 3 Glasses of wine per week    Comment: rarely   Drug use: No   Sexual activity: Yes    Birth control/protection: Pill  Other Topics Concern    Not on file  Social History Narrative   Patient is single, with no children   Patient is a high school graduate   Patient is right handed   Patient drinks 5 or more   Social Determinants of Health   Financial Resource Strain: Not on file  Food Insecurity: Not on file  Transportation Needs: Not on file  Physical Activity: Not on file  Stress: Not on file  Social Connections: Not on file  Intimate Partner Violence: Not on file    Outpatient Medications Prior to Visit  Medication Sig Dispense Refill   amphetamine-dextroamphetamine (ADDERALL XR) 10 MG 24 hr capsule TAKE 1 CAPSULE (10 MG TOTAL) BY MOUTH DAILY. 30 capsule 0   clobetasol ointment (TEMOVATE) 0.05 % Apply small amount to affected areas twice a day as needed 30 g 1   FLUoxetine (PROZAC) 20 MG tablet Take 1 tablet (20 mg total) by mouth daily. 90 tablet 0   LO LOESTRIN FE 1 MG-10 MCG / 10 MCG tablet TAKE 1 TABLET BY MOUTH DAILY. 28 tablet 11   ondansetron (ZOFRAN) 4 MG tablet Take 1 tablet (4 mg total) by mouth every 8 (eight) hours  as needed for nausea or vomiting. 20 tablet 0   Sodium Fluoride 1.1 % PSTE USE AS DIRECTED. 100 mL 3   triamcinolone lotion (KENALOG) 0.1 % Apply generously to affected areas 2 to 3 times a day as needed 60 mL 2   No facility-administered medications prior to visit.    No Known Allergies  Review of Systems A fourteen system review of systems was performed and found to be positive as per HPI.    Objective:    Physical Exam General:  Well Developed, well nourished, appropriate for stated age.  Neuro:  Alert and oriented,  extra-ocular muscles intact  HEENT:  Normocephalic, atraumatic, no sinus tenderness, normal TM's of both ears, neck supple, Skin:  no gross rash, warm, pink. Cardiac:  RRR, S1 S2, no murmur Respiratory:  CTA B/L w/o wheezing, rhonchi, crackles or rales, Not using accessory muscles, speaking in full sentences- unlabored. Chest: no tenderness to palpation, normal  excursion  Vascular:  Ext warm, no cyanosis apprec.; cap RF less 2 sec. Psych:  No HI/SI, judgement and insight good, Euthymic mood. Full Affect.  BP 134/82   Pulse 70   Ht _0  (1.6 m)   Wt 151 lb (68.5 kg)   LMP 03/23/2021   SpO2 98%   BMI 26.75 kg/m  Wt Readings from Last 3 Encounters:  04/06/21 151 lb (68.5 kg)  01/12/21 151 lb 4.8 oz (68.6 kg)  10/15/20 153 lb 14.4 oz (69.8 kg)    Health Maintenance Due  Topic Date Due   COVID-19 Vaccine (1) Never done   HPV VACCINES (1 - Risk 3-dose series) Never done   HIV Screening  Never done   Hepatitis C Screening  Never done   TETANUS/TDAP  01/30/2017       Topic Date Due   HPV VACCINES (1 - Risk 3-dose series) Never done     Lab Results  Component Value Date   TSH 4.410 09/15/2018   Lab Results  Component Value Date   WBC 12.8 (H) 11/30/2018   HGB 14.8 11/30/2018   HCT 42.2 11/30/2018   MCV 88 11/30/2018   PLT 365 11/30/2018   Lab Results  Component Value Date   NA 137 09/15/2018   K 4.2 09/15/2018   CO2 22 09/15/2018   GLUCOSE 109 (H) 09/15/2018   BUN 8 09/15/2018   CREATININE 0.58 09/15/2018   BILITOT 0.3 09/15/2018   ALKPHOS 105 09/15/2018   AST 24 09/15/2018   ALT 35 (H) 09/15/2018   PROT 6.9 09/15/2018   ALBUMIN 4.4 09/15/2018   CALCIUM 9.3 09/15/2018   ANIONGAP 2 (L) 12/09/2013   Lab Results  Component Value Date   CHOL 165 09/15/2018   Lab Results  Component Value Date   HDL 44 09/15/2018   Lab Results  Component Value Date   LDLCALC 106 (H) 09/15/2018   Lab Results  Component Value Date   TRIG 74 09/15/2018   Lab Results  Component Value Date   CHOLHDL 3.8 09/15/2018   Lab Results  Component Value Date   HGBA1C 5.4 09/15/2018       Assessment & Plan:   Problem List Items Addressed This Visit   None Visit Diagnoses     Atypical chest pain    -  Primary   Relevant Orders   EKG 12-Lead   DG Chest 2 View (Completed)   D-Dimer, Quantitative   ECHOCARDIOGRAM COMPLETE    COVID-19 virus infection       Relevant  Orders   Comp Met (CMET)   CBC w/Diff   Abnormal EKG       Relevant Orders   ECHOCARDIOGRAM COMPLETE      Covid-19 virus infection, Atypical chest pain: -Etiology unclear. EKG obtained: sinus rhythm with short PR interval (not a new finding when compared to EKG 12/09/2013), possible left atrial enlargement, rate 69 bpm, no acute ST-T wave changes. Echocardiogram 32/25/6720: normal systolic function with no regional wall motion abnormalities. Will place order for echocardiogram complete to evaluate for changes from  baseline. Will place order for chest x-ray to evaluate for pneumonia secondary to Covid-19 infection or other cardiopulmonary abnormality. Will place lab orders (CBC w/d, CMP, D-dimer) for further evaluation. Vital signs are stable. Discussed with patient episode of tachycardia most likely related to sympathetic drive from nightmare.   No orders of the defined types were placed in this encounter.    Lorrene Reid, PA-C

## 2021-04-07 ENCOUNTER — Other Ambulatory Visit: Payer: Self-pay

## 2021-04-07 LAB — CBC WITH DIFFERENTIAL/PLATELET
Basophils Absolute: 0 10*3/uL (ref 0.0–0.2)
Basos: 1 %
EOS (ABSOLUTE): 0.1 10*3/uL (ref 0.0–0.4)
Eos: 1 %
Hematocrit: 43.4 % (ref 34.0–46.6)
Hemoglobin: 14.5 g/dL (ref 11.1–15.9)
Immature Grans (Abs): 0 10*3/uL (ref 0.0–0.1)
Immature Granulocytes: 0 %
Lymphocytes Absolute: 2.6 10*3/uL (ref 0.7–3.1)
Lymphs: 41 %
MCH: 29.7 pg (ref 26.6–33.0)
MCHC: 33.4 g/dL (ref 31.5–35.7)
MCV: 89 fL (ref 79–97)
Monocytes Absolute: 0.6 10*3/uL (ref 0.1–0.9)
Monocytes: 9 %
Neutrophils Absolute: 3.2 10*3/uL (ref 1.4–7.0)
Neutrophils: 48 %
Platelets: 311 10*3/uL (ref 150–450)
RBC: 4.88 x10E6/uL (ref 3.77–5.28)
RDW: 11.7 % (ref 11.7–15.4)
WBC: 6.4 10*3/uL (ref 3.4–10.8)

## 2021-04-07 LAB — COMPREHENSIVE METABOLIC PANEL
ALT: 13 IU/L (ref 0–32)
AST: 12 IU/L (ref 0–40)
Albumin/Globulin Ratio: 1.8 (ref 1.2–2.2)
Albumin: 4.6 g/dL (ref 3.9–5.0)
Alkaline Phosphatase: 93 IU/L (ref 44–121)
BUN/Creatinine Ratio: 15 (ref 9–23)
BUN: 10 mg/dL (ref 6–20)
Bilirubin Total: <0.2 mg/dL (ref 0.0–1.2)
CO2: 24 mmol/L (ref 20–29)
Calcium: 9.4 mg/dL (ref 8.7–10.2)
Chloride: 101 mmol/L (ref 96–106)
Creatinine, Ser: 0.65 mg/dL (ref 0.57–1.00)
Globulin, Total: 2.6 g/dL (ref 1.5–4.5)
Glucose: 82 mg/dL (ref 70–99)
Potassium: 4.4 mmol/L (ref 3.5–5.2)
Sodium: 139 mmol/L (ref 134–144)
Total Protein: 7.2 g/dL (ref 6.0–8.5)
eGFR: 125 mL/min/{1.73_m2} (ref >59)

## 2021-04-07 LAB — D-DIMER, QUANTITATIVE: D-DIMER: 0.2 mg/L FEU (ref 0.00–0.49)

## 2021-04-08 ENCOUNTER — Other Ambulatory Visit: Payer: Self-pay

## 2021-04-08 ENCOUNTER — Encounter: Payer: Self-pay | Admitting: Physician Assistant

## 2021-04-10 ENCOUNTER — Ambulatory Visit
Admission: RE | Admit: 2021-04-10 | Discharge: 2021-04-10 | Disposition: A | Discharge status: Home / Self Care | Payer: No Typology Code available for payment source | Source: Ambulatory Visit | Attending: Physician Assistant | Admitting: Physician Assistant

## 2021-04-10 ENCOUNTER — Other Ambulatory Visit: Payer: Self-pay

## 2021-04-10 DIAGNOSIS — I493 Ventricular premature depolarization: Secondary | ICD-10-CM | POA: Diagnosis not present

## 2021-04-10 DIAGNOSIS — R000 Tachycardia, unspecified: Secondary | ICD-10-CM | POA: Insufficient documentation

## 2021-04-10 DIAGNOSIS — R55 Syncope and collapse: Secondary | ICD-10-CM | POA: Insufficient documentation

## 2021-04-10 DIAGNOSIS — F419 Anxiety disorder, unspecified: Secondary | ICD-10-CM | POA: Insufficient documentation

## 2021-04-10 DIAGNOSIS — I340 Nonrheumatic mitral (valve) insufficiency: Secondary | ICD-10-CM | POA: Diagnosis not present

## 2021-04-10 DIAGNOSIS — R9431 Abnormal electrocardiogram [ECG] [EKG]: Secondary | ICD-10-CM | POA: Diagnosis not present

## 2021-04-10 DIAGNOSIS — R0789 Other chest pain: Secondary | ICD-10-CM | POA: Diagnosis not present

## 2021-04-10 LAB — ECHOCARDIOGRAM COMPLETE
AR max vel: 1.96 cm2
AV Area VTI: 2.25 cm2
AV Area mean vel: 2.08 cm2
AV Mean grad: 5 mmHg
AV Peak grad: 9.2 mmHg
Ao pk vel: 1.52 m/s
Area-P 1/2: 4.26 cm2
S' Lateral: 2.67 cm

## 2021-04-10 NOTE — Progress Notes (Signed)
*  PRELIMINARY RESULTS* Echocardiogram 2D Echocardiogram has been performed.  Cristela Blue 04/10/2021, 9:34 AM

## 2021-04-14 ENCOUNTER — Encounter: Payer: Self-pay | Admitting: Physician Assistant

## 2021-04-14 ENCOUNTER — Other Ambulatory Visit: Payer: Self-pay

## 2021-04-14 ENCOUNTER — Ambulatory Visit (INDEPENDENT_AMBULATORY_CARE_PROVIDER_SITE_OTHER): Payer: No Typology Code available for payment source | Admitting: Physician Assistant

## 2021-04-14 VITALS — BP 133/75 | HR 80 | Temp 98.1°F | Ht 63.0 in | Wt 151.0 lb

## 2021-04-14 DIAGNOSIS — F909 Attention-deficit hyperactivity disorder, unspecified type: Secondary | ICD-10-CM

## 2021-04-14 DIAGNOSIS — F411 Generalized anxiety disorder: Secondary | ICD-10-CM

## 2021-04-14 DIAGNOSIS — I517 Cardiomegaly: Secondary | ICD-10-CM

## 2021-04-14 DIAGNOSIS — I340 Nonrheumatic mitral (valve) insufficiency: Secondary | ICD-10-CM

## 2021-04-14 DIAGNOSIS — R030 Elevated blood-pressure reading, without diagnosis of hypertension: Secondary | ICD-10-CM

## 2021-04-14 NOTE — Patient Instructions (Signed)
Mitral Valve Regurgitation Mitral valve regurgitation, also called mitral regurgitation, is a condition in which some blood leaks back (regurgitates) through the mitral valve in the heart. The mitral valve is located between the upper left chamber (left atrium) and the lower left chamber (left ventricle) of the heart. When blood travels through the heart, it goes from the left atrium to the left ventricle and then out to the body. Normally, the mitral valve opens when the atrium pumps blood into the ventricle, and it closes when the ventricle pumps blood out to the body. Mitral valve regurgitation happens when the mitral valve does not close properly. As a result, blood in the ventricle leaks back into the atrium. Mitral valve regurgitation causes the heart to work harder to pump blood. If the condition is mild, a person may not have symptoms. However, over time, this can lead to heart failure. What are the causes? This condition may be caused by: A condition in which the mitral valve does not close completely when the heart pumps blood (mitral valve prolapse). Heart valve infection (endocarditis). Certain types of heart disease. A condition that causes inflammation of the heart, blood vessels, or joints (rheumatic fever). Certain conditions that are present at birth (congenital heart defect). Previous radiation therapy to the chest area. Damage to the mitral valve, such as from injury (trauma) to the heart or a heart attack. Certain combinations of weight-loss (anti-obesity) medicines. What are the signs or symptoms? In some cases of mild to moderate mitral regurgitation, there are no symptoms. Symptoms of this condition include: Shortness of breath. Fatigue. Activity intolerance. Cough. Severe symptoms of this condition include: Suddenly waking up at night with difficulty breathing. Fast or irregular heartbeat. Swelling in the lower legs, ankles, and feet. Fluid in the lungs that makes it  very hard to breathe (pulmonary edema). How is this diagnosed? This condition may be diagnosed based on: A physical exam. Your health care provider will listen to your heart for an abnormal heart sound (murmur). Tests to confirm the diagnosis. These may include: A test that creates ultrasound images of the heart (echocardiogram). This test allows your health care provider to see how the heart valves work while your heart is beating. Chest X-ray. A test that records the electrical impulses of the heart (electrocardiogram, or ECG). A test that looks at the structure and function of the heart (cardiac catheterization). How is this treated? This condition may be treated with: Medicines. These may be given to treat symptoms and prevent complications. Surgery or catheter-based procedures to repair or replace the mitral valve. This may be done in severe, long-term (chronic) cases. Follow these instructions at home: Eating and drinking  Eat a heart-healthy diet that includes whole grains, fresh fruits and vegetables, low-fat (lean) proteins, and low-fat or nonfat dairy products. Consider working with a dietitian to help you make healthy food choices. Limit how much salt (sodium) you eat as told by your health care provider. Follow instructions from your health care provider about any other eating or drinking restrictions, such as limiting foods that are high in fat and processed sugars. Use healthy cooking methods, such as roasting, grilling, broiling, baking, poaching, steaming, or stir-frying. Alcohol use Do not drink alcohol if: Your health care provider tells you not to drink. You are pregnant, may be pregnant, or are planning to become pregnant. If you drink alcohol: Limit how much you use to: 0-1 drink a day for women. 0-2 drinks a day for men. Be aware of  how much alcohol is in your drink. In the [place] one drink equals one 12 oz bottle of beer (355 mL), one 5 oz glass of wine (148 mL), or  one 1 oz glass of hard liquor (44 mL). Activity Return to your normal activities as told by your health care provider. Ask your health care provider what activities are safe for you. Regular exercise is important for the health of your heart and for maintaining a healthy weight. Ask your health care provider what type of exercise is safe for you. You may need to avoid strenuous exercise. Lifestyle Maintain a healthy weight. Do not use any products that contain nicotine or tobacco, such as cigarettes, e-cigarettes, and chewing tobacco. If you need help quitting, ask your health care provider. Find ways to manage stress. If you need help with this, ask your health care provider. General instructions Take over-the-counter and prescription medicines only as told by your health care provider. Work closely with your health care provider to manage any other health conditions you have, such as diabetes or high blood pressure. If you plan to become pregnant, talk with your health care provider first. Keep all follow-up visits as told by your health care provider. This is important. Contact a health care provider if you: Have a fever. Feel more tired than usual when doing physical activity. Have a dry cough. Get help right away if you: Have shortness of breath. Develop chest pain. Have swelling in your hands, feet, ankles, or abdomen that is getting worse. Have trouble staying awake or you faint. Feel dizzy or unsteady. Suddenly gain weight. Feel confused. These symptoms may represent a serious problem that is an emergency. Do not wait to see if the symptoms will go away. Get medical help right away. Call your local emergency services (911 in the [place] Do not drive yourself to the hospital. Summary Mitral valve regurgitation, also called mitral regurgitation, is a condition in which some blood leaks back (regurgitates) through the mitral valve in the heart. Depending on how severe your condition  is, you may be treated with medicines, catheter-based procedures, or surgery. Practice heart-healthy habits to manage this condition. These include limiting alcohol, avoiding nicotine and tobacco, and eating a heart-healthy diet that is low in salt (sodium). This information is not intended to replace advice given to you by your health care provider. Make sure you discuss any questions you have with your health care provider. Document Revised: 05/28/2018 Document Reviewed: 05/28/2018 Elsevier Patient Education  2022 [employer].

## 2021-04-14 NOTE — Progress Notes (Signed)
Established Patient Office Visit  Subjective:  Patient ID: Kristin Orozco, female    DOB: [birth year]  Age: 25 y.o. MRN: [id number]  CC:  Chief Complaint  Patient presents with   Follow-up    Mood   Hypertension    HPI [name] presents for follow up on mood and elevated blood pressure. States she is feeling better since her last visit. States no longer has chest discomfort.   Mood: States higher dose of Prozac is making her too drowsy. In the past has tried Buspar, Wellbutrin, Lexapro, and Zoloft which she did not tolerate due to various side effects. Feels like her anxiety became worse after starting birth control and plans to discuss stopping OCP with her OB/GYN at her upcoming annual physical. States her symptoms are more related to anxiety vs depression.   ADHD: Reports takes medication daily during the week and does not usually take it on the weekends. Denies chest pain, palpitations, SI/HI or appetite changes. Since Covid-19 infection does have some sleep disturbance. Medication helps her stay more focused at work and states her co-workers notice a difference.   Past Medical History:  Diagnosis Date   Anxiety    Syncope    Tachycardia    Thyroid disease    Hx of thyroid nodule--followed by PCP    History reviewed. No pertinent surgical history.  Family History  Problem Relation Age of Onset   Hypertension Mother    Lupus Maternal Uncle    Multiple sclerosis Maternal Grandmother    Cancer Maternal Grandmother        breast and bone   Diabetes Maternal Grandmother    Breast cancer Maternal Grandmother 63       mets to bone   Thyroid disease Maternal Grandmother        hypothyroid   Heart attack Maternal Grandfather     Social History   Socioeconomic History   Marital status: Single    Spouse name: Not on file   Number of children: Not on file   Years of education: Not on file   Highest education level: Not on file  Occupational History   Not  on file  Tobacco Use   Smoking status: Former    Types: Cigarettes    Quit date: 05/20/2018    Years since quitting: 2.9   Smokeless tobacco: Former    Quit date: 05/22/2019   Tobacco comments:    stopped in Nov 2019  Vaping Use   Vaping Use: Never used  Substance and Sexual Activity   Alcohol use: Yes    Alcohol/week: 3.0 standard drinks    Types: 3 Glasses of wine per week    Comment: rarely   Drug use: No   Sexual activity: Yes    Birth control/protection: Pill  Other Topics Concern   Not on file  Social History Narrative   Patient is single, with no children   Patient is a high school graduate   Patient is right handed   Patient drinks 5 or more   Social Determinants of Health   Financial Resource Strain: Not on file  Food Insecurity: Not on file  Transportation Needs: Not on file  Physical Activity: Not on file  Stress: Not on file  Social Connections: Not on file  Intimate Partner Violence: Not on file    Outpatient Medications Prior to Visit  Medication Sig Dispense Refill   amphetamine-dextroamphetamine (ADDERALL XR) 10 MG 24 hr capsule TAKE 1 CAPSULE (10 MG  TOTAL) BY MOUTH DAILY. 30 capsule 0   COVID-19 At Home Antigen Test (CARESTART COVID-19 HOME TEST) KIT USE AS DIRECTED 2 kit 0   FLUoxetine (PROZAC) 20 MG tablet Take 1 tablet (20 mg total) by mouth daily. 90 tablet 0   LO LOESTRIN FE 1 MG-10 MCG / 10 MCG tablet TAKE 1 TABLET BY MOUTH DAILY. 28 tablet 11   ondansetron (ZOFRAN) 4 MG tablet Take 1 tablet (4 mg total) by mouth every 8 (eight) hours as needed for nausea or vomiting. 20 tablet 0   Sodium Fluoride 1.1 % PSTE USE AS DIRECTED. 100 mL 3   clobetasol ointment (TEMOVATE) 0.05 % Apply small amount to affected areas twice a day as needed 30 g 1   triamcinolone lotion (KENALOG) 0.1 % Apply generously to affected areas 2 to 3 times a day as needed 60 mL 2   No facility-administered medications prior to visit.    No Known Allergies  ROS Review of  Systems Review of Systems:  A fourteen system review of systems was performed and found to be positive as per HPI.   Objective:    Physical Exam General:  Well Developed, well nourished, appropriate for stated age.  Neuro:  Alert and oriented,  extra-ocular muscles intact  HEENT:  Normocephalic, atraumatic, neck supple, no carotid bruits appreciated  Skin:  no gross rash, warm, pink. Cardiac:  RRR, S1 S2 Respiratory:  CTA B/L, Not using accessory muscles, speaking in full sentences- unlabored. Vascular:  Ext warm, no cyanosis apprec.; cap RF less 2 sec. Psych:  No HI/SI, judgement and insight good, Euthymic mood. Full Affect.  BP 133/75   Pulse 80   Temp 98.1 F (36.7 C)   Ht '5\' 3"'  (1.6 m)   Wt 151 lb (68.5 kg)   LMP 03/23/2021   SpO2 97%   BMI 26.75 kg/m  Wt Readings from Last 3 Encounters:  04/14/21 151 lb (68.5 kg)  04/06/21 151 lb (68.5 kg)  01/12/21 151 lb 4.8 oz (68.6 kg)     Health Maintenance Due  Topic Date Due   HPV VACCINES (1 - Risk 3-dose series) Never done   HIV Screening  Never done   Hepatitis C Screening  Never done   TETANUS/TDAP  01/30/2017   COVID-19 Vaccine (3 - Booster for Pfizer series) 08/26/2020       Topic Date Due   HPV VACCINES (1 - Risk 3-dose series) Never done    Lab Results  Component Value Date   TSH 4.410 09/15/2018   Lab Results  Component Value Date   WBC 6.4 04/06/2021   HGB 14.5 04/06/2021   HCT 43.4 04/06/2021   MCV 89 04/06/2021   PLT 311 04/06/2021   Lab Results  Component Value Date   NA 139 04/06/2021   K 4.4 04/06/2021   CO2 24 04/06/2021   GLUCOSE 82 04/06/2021   BUN 10 04/06/2021   CREATININE 0.65 04/06/2021   BILITOT <0.2 04/06/2021   ALKPHOS 93 04/06/2021   AST 12 04/06/2021   ALT 13 04/06/2021   PROT 7.2 04/06/2021   ALBUMIN 4.6 04/06/2021   CALCIUM 9.4 04/06/2021   ANIONGAP 2 (L) 12/09/2013   EGFR 125 04/06/2021   Lab Results  Component Value Date   CHOL 165 09/15/2018   Lab Results   Component Value Date   HDL 44 09/15/2018   Lab Results  Component Value Date   LDLCALC 106 (H) 09/15/2018   Lab Results  Component Value Date  TRIG 74 09/15/2018   Lab Results  Component Value Date   CHOLHDL 3.8 09/15/2018   Lab Results  Component Value Date   HGBA1C 5.4 09/15/2018   Depression screen [facility] 2/9 04/14/2021 01/12/2021 10/15/2020 07/21/2020 02/18/2020  Decreased Interest '1 1 1 ' 0 1  Down, Depressed, Hopeless '1 1 1 ' 0 1  PHQ - 2 Score '2 2 2 ' 0 2  Altered sleeping '3 2 1 ' 0 2  Tired, decreased energy '3 2 2 ' 0 2  Change in appetite '1 2 2 ' 0 2  Feeling bad or failure about yourself  0 1 0 0 0  Trouble concentrating '3 2 2 ' 0 3  Moving slowly or fidgety/restless '3 2 3 ' 0 2  Suicidal thoughts 0 0 0 0 0  PHQ-9 Score '15 13 12 ' 0 13  Difficult doing work/chores Very difficult Very difficult - - Very difficult  Some recent data might be hidden   GAD 7 : Generalized Anxiety Score 04/14/2021 01/12/2021 02/18/2020 07/23/2019  Nervous, Anxious, on Edge '3 1 3 3  ' Control/stop worrying '1 1 3 3  ' Worry too much - different things '1 1 3 3  ' Trouble relaxing '1 1 2 3  ' Restless '1 2 3 3  ' Easily annoyed or irritable '3 2 3 3  ' Afraid - awful might happen '1 2 3 3  ' Total GAD 7 Score '11 10 20 21  ' Anxiety Difficulty Very difficult Very difficult Very difficult Very difficult        Assessment & Plan:   Problem List Items Addressed This Visit       Other   Elevated BP without diagnosis of hypertension   GAD (generalized anxiety disorder) - Primary   Other Visit Diagnoses     Left atrial dilation       Relevant Orders   Ambulatory referral to Cardiology   Mitral valve insufficiency, unspecified etiology       Relevant Orders   Ambulatory referral to Cardiology   Adult ADHD          Mitral valve insufficiency, unspecified etiology, Left atrial dilation: -Discussed with patient recent echocardiogram: LVEF 60-65%, no regional wall motion abnormalities of the left ventricle, normal  right ventricle, left atrial size mildly dilated, mild mitral valve regurgitation, no evidence of mitral stenosis, normal aortic valve, frequent PVCs noted.  Patient denies any prior history of rheumatic fever.  Patient is agreeable to cardiology referral.  Elevated BP without diagnosis of hypertension: -BP today improved from prior visit. Discussed with patient BP goal <130/80.  -Recommend to continue with reduction of soft drinks and follow a low sodium diet. -Will continue to monitor.  GAD: -Discussed with patient treatment adjustments including alternative medication such as SNRI and patient prefers to discuss with OB/GYN discontinuing OCP before considering new medication. Recommend to discontinue fluoxetine and monitor symptoms. Advised to let me know if symptoms worsen.  -Will continue to monitor.  Adult ADHD: -Stable. -Continue current medication regimen. BP and pulse stable. Per chart review, patient's blood pressure and pulse chronically fluctuate even prior to starting stimulant therapy. Will continue to closely monitor BP/pulse and potential side effects.  -Follow up in 3 months for medication management.  No orders of the defined types were placed in this encounter.   Follow-up: Return in about 3 months (around 07/15/2021) for CPE and FBW .    Lorrene Reid, PA-C

## 2021-04-16 ENCOUNTER — Other Ambulatory Visit (HOSPITAL_COMMUNITY): Payer: No Typology Code available for payment source

## 2021-04-17 ENCOUNTER — Encounter: Payer: Self-pay | Admitting: Physician Assistant

## 2021-04-30 ENCOUNTER — Other Ambulatory Visit: Payer: Self-pay | Admitting: Physician Assistant

## 2021-04-30 ENCOUNTER — Other Ambulatory Visit: Payer: Self-pay

## 2021-04-30 DIAGNOSIS — F909 Attention-deficit hyperactivity disorder, unspecified type: Secondary | ICD-10-CM

## 2021-04-30 MED FILL — Amphetamine-Dextroamphetamine Cap ER 24HR 10 MG: ORAL | 30 days supply | Qty: 30 | Fill #0 | Status: AC

## 2021-05-01 ENCOUNTER — Other Ambulatory Visit: Payer: Self-pay

## 2021-05-14 ENCOUNTER — Encounter: Payer: Self-pay | Admitting: Cardiology

## 2021-05-14 ENCOUNTER — Ambulatory Visit: Payer: No Typology Code available for payment source | Admitting: Cardiology

## 2021-05-14 ENCOUNTER — Other Ambulatory Visit: Payer: Self-pay

## 2021-05-14 VITALS — BP 148/90 | HR 77 | Ht 63.0 in | Wt 147.0 lb

## 2021-05-14 DIAGNOSIS — R030 Elevated blood-pressure reading, without diagnosis of hypertension: Secondary | ICD-10-CM

## 2021-05-14 DIAGNOSIS — I493 Ventricular premature depolarization: Secondary | ICD-10-CM | POA: Diagnosis not present

## 2021-05-14 DIAGNOSIS — R931 Abnormal findings on diagnostic imaging of heart and coronary circulation: Secondary | ICD-10-CM | POA: Insufficient documentation

## 2021-05-14 NOTE — Assessment & Plan Note (Signed)
Lots of PVCs noted on echocardiogram and some ectopy on exam.  She does feels palpitations but does not seem to be overly symptomatic with them.  More concerned about potential PVC burden being high.  Would like to determine how much there he truly is to see if we need to potentially start treating.  Plan: 3-day Zio patch

## 2021-05-14 NOTE — Patient Instructions (Addendum)
Medication Instructions:  - Your physician recommends that you continue on your current medications as directed. Please refer to the Current Medication list given to you today.  *If you need a refill on your cardiac medications before your next appointment, please call your pharmacy*   Lab Work: - none ordered  If you have labs (blood work) drawn today and your tests are completely normal, you will receive your results only by: MyChart Message (if you have MyChart) OR A paper copy in the mail If you have any lab test that is abnormal or we need to change your treatment, we will call you to review the results.   Testing/Procedures:  1) Heart Monitor:  Wear Time:  3 days To be mailed to your home address  - Your physician has recommended that you wear a Zio monitor.   This monitor is a medical device that records the heart's electrical activity. Doctors most often use these monitors to diagnose arrhythmias. Arrhythmias are problems with the speed or rhythm of the heartbeat. The monitor is a small device applied to your chest. You can wear one while you do your normal daily activities. While wearing this monitor if you have any symptoms to push the button and record what you felt. Once you have worn this monitor for the period of time provider prescribed (Usually 14 days), you will return the monitor device in the postage paid box. Once it is returned they will download the data collected and provide [place] with a report which the provider will then review and we will call you with those results. Important tips:  Avoid showering during the first 24 hours of wearing the monitor. Avoid excessive sweating to help maximize wear time. Do not submerge the device, no hot tubs, and no swimming pools. Keep any lotions or oils away from the patch. After 24 hours you may shower with the patch on. Take brief showers with your back facing the shower head.  Do not remove patch once it has been placed  because that will interrupt data and decrease adhesive wear time. Push the button when you have any symptoms and write down what you were feeling. Once you have completed wearing your monitor, remove and place into box which has postage paid and place in your outgoing mailbox.  If for some reason you have misplaced your box then call our office and we can provide another box and/or mail it off for you.      Follow-Up: At [facility], you and your health needs are our priority.  As part of our continuing mission to provide you with exceptional heart care, we have created designated Provider Care Teams.  These Care Teams include your primary Cardiologist (physician) and Advanced Practice Providers (APPs -  Physician Assistants and Nurse Practitioners) who all work together to provide you with the care you need, when you need it.  We recommend signing up for the patient portal called "MyChart".  Sign up information is provided on this After Visit Summary.  MyChart is used to connect with patients for Virtual Visits (Telemedicine).  Patients are able to view lab/test results, encounter notes, upcoming appointments, etc.  Non-urgent messages can be sent to your provider as well.   To learn more about what you can do with MyChart, go to [email or web address].    Your next appointment:   1 month(s)  The format for your next appointment:   In Person  Provider:   Bryan Lemma, MD    Other  Instructions N/a

## 2021-05-14 NOTE — Assessment & Plan Note (Signed)
I reviewed the echocardiogram with the reading physician.  There does appear to be mild mitral regurgitation but not significant and the left atrium does appear to be slightly generous but is certainly not significant enlarged.  Not overly worrisome.  Could potentially recheck an echocardiogram in 2 to 3 years to reevaluate. More concerning however was the frequent PVCs noted on the monitor strip. We will plan to check Zio patch monitor to determine PVC burden.

## 2021-05-14 NOTE — Progress Notes (Signed)
Primary Care Provider: Lorrene Reid, PA-C [facility] Cardiologist: None Electrophysiologist: None  Clinic Note: Chief Complaint  Patient presents with   New Patient (Initial Visit)    Establish care with provider for left atrial dilation and mitral valve insuffiencey. Medications verbally reviewed with patient.     ===================================  ASSESSMENT/PLAN   Problem List Items Addressed This Visit       Cardiology Problems   PVCs (premature ventricular contractions) - Primary    Lots of PVCs noted on echocardiogram and some ectopy on exam.  She does feels palpitations but does not seem to be overly symptomatic with them.  More concerned about potential PVC burden being high.  Would like to determine how much there he truly is to see if we need to potentially start treating.  Plan: 3-day Zio patch      Relevant Orders   EKG 12-Lead   LONG TERM MONITOR (3-14 DAYS)     Other   Elevated BP without diagnosis of hypertension    Usually her blood pressures are well better controlled than this.  She is quite anxious and was having to wait for a long time for this visit.  We can reassess in follow-up, but I suspect that her pressures are not usually this high.      Abnormal echocardiogram    I reviewed the echocardiogram with the reading physician.  There does appear to be mild mitral regurgitation but not significant and the left atrium does appear to be slightly generous but is certainly not significant enlarged.  Not overly worrisome.  Could potentially recheck an echocardiogram in 2 to 3 years to reevaluate. More concerning however was the frequent PVCs noted on the monitor strip. We will plan to check Zio patch monitor to determine PVC burden.       ===================================  HPI:    Kristin Orozco is a 25 y.o. female with past medical history of Anxiety, and ADHD who is being seen today for the evaluation of ABNORMAL ECHOCARDIOGRAM  at the request of Lorrene Reid, PA-C.  Kristin Orozco was seen by Lorrene Reid, PA-C on 04/06/2021 for COVID-positive test on October 4.  Tonight before coming in she had a nightmare about her grandmother.  She woke up with a sensation of chest tightness with heart racing.  All day the following day she felt her chest uncomfortable.  No associated dyspnea.  Had a EKG notable for short PR interval and possible left atrial enlargement. -> 2D echo ordered.  Reviewed below. Seen in follow-up on 04/14/2021.  Feeling better with no further chest pain.  Symptoms thought to be related to anxiety. -> Referred to cardiology because of "left atrial enlargement ".  Recent Hospitalizations: None  Reviewed  CV studies:    The following studies were reviewed today: (if available, images/films reviewed: From Epic Chart or Care Everywhere) Transthoracic Echo 12/24/2013 for syncope: Normal LV size and function.  No R WMA. Transthoracic Echo 04/10/2021:: EF 60 to 65%.  No R WMA.  Normal RVP.  Mild LA dilation.  Mild MR.  Normal aortic valve.  Normal pressures.  Frequent PVCs noted.   Interval History:   Kristin Orozco presents here for evaluation of her abnormal echocardiogram finding as well as palpitations and an episode of chest pain.  She is not had any more of the chest pain like she had before.  She does feel the palpitations on occasion off and on but nothing prolonged that would be associated  with any lightheadedness or dizziness.  Chest tightness and racing that she had at the time of her episode is no longer present.  This probably was related to anxiety.  She is pretty active also ago and does not have any exertional chest pain or pressure.  No real lightheadedness or dizziness, no syncope/near syncope.  CV Review of Symptoms (Summary) Cardiovascular ROS: positive for - palpitations and 1 prolonged episode of chest pain noted above, otherwise no further symptoms.  Palpitations are  short-lived, but somewhat bothersome.  No lightheadedness or dizziness associated. negative for - dyspnea on exertion, orthopnea, paroxysmal nocturnal dyspnea, shortness of breath, or further tachycardia spells, syncope/near syncope or TIA/amaurosis fugax, claudication  REVIEWED OF SYSTEMS   Review of Systems  Constitutional:  Negative for malaise/fatigue and weight loss.  Respiratory:  Negative for cough and shortness of breath.   Cardiovascular:        Per HPI  Gastrointestinal:  Negative for blood in stool and melena.  Genitourinary:  Negative for hematuria.  Musculoskeletal:  Negative for joint pain.  Psychiatric/Behavioral:  Negative for memory loss. The patient is not nervous/anxious (Not at baseline, but just has episodes.) and does not have insomnia.    I have reviewed and (if needed) personally updated the patient's problem list, medications, allergies, past medical and surgical history, social and family history.   PAST MEDICAL HISTORY   Past Medical History:  Diagnosis Date   Anxiety    Syncope    Tachycardia    Thyroid disease    Hx of thyroid nodule--followed by PCP    PAST SURGICAL HISTORY   History reviewed. No pertinent surgical history.  Immunization History  Administered Date(s) Administered   DTaP 04/25/1996, 07/13/1996, 08/24/1996, 08/23/1997, 06/06/2000   Hepatitis A 03/06/2008, 08/03/2012   Hepatitis A, Ped/Adol-2 Dose 09/17/2008   Hepatitis B 1995/08[birth year]31, 04/25/1996, 08/24/1996   HiB (PRP-OMP) 04/25/1996, 07/13/1996, 08/24/1996, 08/23/1997   IPV 04/25/1996, 07/13/1996, 02/14/1997, 06/06/2000   Influenza-Unspecified 03/07/2018, 04/06/2019, 04/11/2020, 03/30/2021   MMR 02/14/1997, 06/06/2000   Meningococcal Conjugate 08/03/2012   PFIZER(Purple Top)SARS-COV-2 Vaccination 03/07/2020, 03/28/2020   Tdap 01/31/2007   Varicella 02/14/1997, 08/03/2012    MEDICATIONS/ALLERGIES   Current Meds  Medication Sig   amphetamine-dextroamphetamine (ADDERALL XR)  10 MG 24 hr capsule TAKE 1 CAPSULE (10 MG TOTAL) BY MOUTH DAILY.   FLUoxetine (PROZAC) 20 MG tablet Take 1 tablet (20 mg total) by mouth daily.   LO LOESTRIN FE 1 MG-10 MCG / 10 MCG tablet TAKE 1 TABLET BY MOUTH DAILY.   ondansetron (ZOFRAN) 4 MG tablet Take 1 tablet (4 mg total) by mouth every 8 (eight) hours as needed for nausea or vomiting.    No Known Allergies  SOCIAL HISTORY/FAMILY HISTORY   Reviewed in Epic:   Social History   Tobacco Use   Smoking status: Former    Types: Cigarettes    Quit date: 05/20/2018    Years since quitting: 2.9   Smokeless tobacco: Former    Quit date: 05/22/2019   Tobacco comments:    stopped in Nov 2019  Vaping Use   Vaping Use: Never used  Substance Use Topics   Alcohol use: Yes    Alcohol/week: 3.0 standard drinks    Types: 3 Glasses of wine per week    Comment: rarely   Drug use: No   Social History   Social History Narrative   Patient is single, with no children   Patient is a high school graduate   Patient is right handed  Patient drinks 5 or more   Family History  Problem Relation Age of Onset   Hypertension Mother    Lupus Maternal Uncle    Multiple sclerosis Maternal Grandmother    Cancer Maternal Grandmother        breast and bone   Diabetes Maternal Grandmother    Breast cancer Maternal Grandmother 63       mets to bone   Thyroid disease Maternal Grandmother        hypothyroid   Heart attack Maternal Grandfather     OBJCTIVE -PE, EKG, labs   Wt Readings from Last 3 Encounters:  05/14/21 147 lb (66.7 kg)  04/14/21 151 lb (68.5 kg)  04/06/21 151 lb (68.5 kg)    Physical Exam: BP (!) 148/90 (BP Location: Left Arm, Patient Position: Sitting, Cuff Size: Normal)   Pulse 77   Ht '5\' 3"'  (1.6 m)   Wt 147 lb (66.7 kg)   SpO2 98%   BMI 26.04 kg/m  Physical Exam Vitals reviewed.  Constitutional:      General: She is not in acute distress.    Appearance: Normal appearance. She is normal weight. She is not  ill-appearing.  HENT:     Head: Normocephalic and atraumatic.  Cardiovascular:     Rate and Rhythm: Normal rate and regular rhythm. No extrasystoles are present.    Chest Wall: PMI is not displaced.     Pulses: Normal pulses.     Heart sounds: Normal heart sounds, S1 normal and S2 normal. No murmur heard.   No friction rub. No gallop.  Pulmonary:     Effort: Pulmonary effort is normal. No respiratory distress.     Breath sounds: Normal breath sounds.  Chest:     Chest wall: No tenderness.  Musculoskeletal:        General: No swelling. Normal range of motion.     Cervical back: Normal range of motion and neck supple.  Skin:    General: Skin is warm and dry.  Neurological:     General: No focal deficit present.     Mental Status: She is alert and oriented to person, place, and time.  Psychiatric:        Mood and Affect: Mood normal.        Behavior: Behavior normal.        Thought Content: Thought content normal.        Judgment: Judgment normal.     Adult ECG Report  Rate: 77 ;  Rhythm: normal sinus rhythm and - Short PR interval but otherwise normal axis, intervals and durations. ;   Narrative Interpretation: Stable  Recent Labs:  Reviewed.  No new lipids Lab Results  Component Value Date   CHOL 165 09/15/2018   HDL 44 09/15/2018   LDLCALC 106 (H) 09/15/2018   TRIG 74 09/15/2018   CHOLHDL 3.8 09/15/2018   Lab Results  Component Value Date   CREATININE 0.65 04/06/2021   BUN 10 04/06/2021   NA 139 04/06/2021   K 4.4 04/06/2021   CL 101 04/06/2021   CO2 24 04/06/2021   CBC Latest Ref Rng & Units 04/06/2021 11/30/2018 09/15/2018  WBC 3.4 - 10.8 x10E3/uL 6.4 12.8(H) 8.6  Hemoglobin 11.1 - 15.9 g/dL 14.5 14.8 13.9  Hematocrit 34.0 - 46.6 % 43.4 42.2 38.9  Platelets 150 - 450 x10E3/uL 311 365 393    Lab Results  Component Value Date   HGBA1C 5.4 09/15/2018   Lab Results  Component Value Date  TSH 4.410 09/15/2018     ==================================================  COVID-19 Education: The signs and symptoms of COVID-19 were discussed with the patient and how to seek care for testing (follow up with PCP or arrange E-visit).    I spent a total of 25  minutes with the patient spent in direct patient consultation.  Additional time spent with chart review  / charting (studies, outside notes, etc): 16 min Total Time: 41 min  Current medicines are reviewed at length with the patient today.  (+/- concerns) none  This visit occurred during the SARS-CoV-2 public health emergency.  Safety protocols were in place, including screening questions prior to the visit, additional usage of staff PPE, and extensive cleaning of exam room while observing appropriate contact time as indicated for disinfecting solutions.  Notice: This dictation was prepared with Dragon dictation along with smart phrase technology. Any transcriptional errors that result from this process are unintentional and may not be corrected upon review.   Studies Ordered:  Orders Placed This Encounter  Procedures   LONG TERM MONITOR (3-14 DAYS)   EKG 12-Lead    Patient Instructions / Medication Changes & Studies & Tests Ordered   Patient Instructions  Medication Instructions:  - Your physician recommends that you continue on your current medications as directed. Please refer to the Current Medication list given to you today.  *If you need a refill on your cardiac medications before your next appointment, please call your pharmacy*   Lab Work: - none ordered  If you have labs (blood work) drawn today and your tests are completely normal, you will receive your results only by: [place] (if you have MyChart) OR A paper copy in the mail If you have any lab test that is abnormal or we need to change your treatment, we will call you to review the results.   Testing/Procedures:  1) Heart Monitor:  Wear Time:  3 days To be  mailed to your home address  - Your physician has recommended that you wear a Zio monitor.   This monitor is a medical device that records the heart's electrical activity. Doctors most often use these monitors to diagnose arrhythmias. Arrhythmias are problems with the speed or rhythm of the heartbeat. The monitor is a small device applied to your chest. You can wear one while you do your normal daily activities. While wearing this monitor if you have any symptoms to push the button and record what you felt. Once you have worn this monitor for the period of time provider prescribed (Usually 14 days), you will return the monitor device in the postage paid box. Once it is returned they will download the data collected and provide [place] with a report which the provider will then review and we will call you with those results. Important tips:  Avoid showering during the first 24 hours of wearing the monitor. Avoid excessive sweating to help maximize wear time. Do not submerge the device, no hot tubs, and no swimming pools. Keep any lotions or oils away from the patch. After 24 hours you may shower with the patch on. Take brief showers with your back facing the shower head.  Do not remove patch once it has been placed because that will interrupt data and decrease adhesive wear time. Push the button when you have any symptoms and write down what you were feeling. Once you have completed wearing your monitor, remove and place into box which has postage paid and place in your outgoing mailbox.  If for some reason you have misplaced your box then call our office and we can provide another box and/or mail it off for you.      Follow-Up: At [facility], you and your health needs are our priority.  As part of our continuing mission to provide you with exceptional heart care, we have created designated Provider Care Teams.  These Care Teams include your primary Cardiologist (physician) and Advanced Practice  Providers (APPs -  Physician Assistants and Nurse Practitioners) who all work together to provide you with the care you need, when you need it.  We recommend signing up for the patient portal called "MyChart".  Sign up information is provided on this After Visit Summary.  MyChart is used to connect with patients for Virtual Visits (Telemedicine).  Patients are able to view lab/test results, encounter notes, upcoming appointments, etc.  Non-urgent messages can be sent to your provider as well.   To learn more about what you can do with MyChart, go to [email or web address].    Your next appointment:   1 month(s)  The format for your next appointment:   In Person  Provider:   Glenetta Hew, MD    Other Instructions N/a    Glenetta Hew, M.D., M.S. Interventional Cardiologist   Pager # [phone] Phone # [phone] [address] [place], [place] [address]   Thank you for choosing [facility] in [place]!!

## 2021-05-14 NOTE — Assessment & Plan Note (Signed)
Usually her blood pressures are well better controlled than this.  She is quite anxious and was having to wait for a long time for this visit.  We can reassess in follow-up, but I suspect that her pressures are not usually this high.

## 2021-05-18 ENCOUNTER — Ambulatory Visit (INDEPENDENT_AMBULATORY_CARE_PROVIDER_SITE_OTHER): Payer: No Typology Code available for payment source

## 2021-05-18 DIAGNOSIS — I493 Ventricular premature depolarization: Secondary | ICD-10-CM

## 2021-05-20 ENCOUNTER — Other Ambulatory Visit: Payer: Self-pay

## 2021-05-20 ENCOUNTER — Encounter: Payer: Self-pay | Admitting: Physician Assistant

## 2021-05-20 ENCOUNTER — Ambulatory Visit (INDEPENDENT_AMBULATORY_CARE_PROVIDER_SITE_OTHER): Payer: No Typology Code available for payment source | Admitting: Physician Assistant

## 2021-05-20 VITALS — Ht 63.0 in | Wt 148.0 lb

## 2021-05-20 DIAGNOSIS — J988 Other specified respiratory disorders: Secondary | ICD-10-CM

## 2021-05-20 DIAGNOSIS — B9689 Other specified bacterial agents as the cause of diseases classified elsewhere: Secondary | ICD-10-CM

## 2021-05-20 MED ORDER — DOXYCYCLINE HYCLATE 100 MG PO TABS
100.0000 mg | ORAL_TABLET | Freq: Two times a day (BID) | ORAL | 0 refills | Status: DC
  Filled 2021-05-20: qty 14, 7d supply, fill #0

## 2021-05-20 MED ORDER — METHYLPREDNISOLONE 4 MG PO TBPK
ORAL_TABLET | ORAL | 0 refills | Status: DC
  Filled 2021-05-20: qty 21, 6d supply, fill #0

## 2021-05-20 NOTE — Progress Notes (Signed)
Telehealth office visit note for Kristin Reid, PA-C- at Primary Care at [facility]   I connected with current patient today by telephone and verified that I am speaking with the correct person    Location of the patient: Home  Location of the provider: Office - This visit type was conducted due to national recommendations for restrictions regarding the COVID-19 Pandemic (e.g. social distancing) in an effort to limit this patient's exposure and mitigate transmission in our community.    - No physical exam could be performed with this format, beyond that communicated to [place] by the patient/ family members as noted.   - Additionally my office staff/ schedulers were to discuss with the patient that there may be a monetary charge related to this service, depending on their medical insurance.  My understanding is that patient understood and consented to proceed.     _________________________________________________________________________________   History of Present Illness: Patient calls in with c/o nasal congestion, postnasal drainage, and ears popping.  Patient reports this morning had a productive cough and noticed sputum was bloody and green.  Denies fever, shortness of breath, wheezing, sinus pressure, body aches, night sweats, nausea, vomiting, diarrhea or chest pain.  Has been experiencing congestion for couple weeks and has been taking her allergy medication.  Other symptoms started 3 days ago.  Reports a headache mostly in the mornings which resolves throughout the day.  States has done 2 COVID tests which have been negative.    GAD 7 : Generalized Anxiety Score 05/20/2021 04/14/2021 01/12/2021 02/18/2020  Nervous, Anxious, on Edge '1 3 1 3  ' Control/stop worrying '1 1 1 3  ' Worry too much - different things '1 1 1 3  ' Trouble relaxing '1 1 1 2  ' Restless '1 1 2 3  ' Easily annoyed or irritable '1 3 2 3  ' Afraid - awful might happen '1 1 2 3  ' Total GAD 7 Score '7 11 10 20  ' Anxiety  Difficulty Somewhat difficult Very difficult Very difficult Very difficult    Depression screen [facility] 2/9 05/20/2021 04/14/2021 01/12/2021 10/15/2020 07/21/2020  Decreased Interest '1 1 1 1 ' 0  Down, Depressed, Hopeless 0 '1 1 1 ' 0  PHQ - 2 Score '1 2 2 2 ' 0  Altered sleeping 0 '3 2 1 ' 0  Tired, decreased energy '1 3 2 2 ' 0  Change in appetite '1 1 2 2 ' 0  Feeling bad or failure about yourself  0 0 1 0 0  Trouble concentrating '2 3 2 2 ' 0  Moving slowly or fidgety/restless 0 '3 2 3 ' 0  Suicidal thoughts 0 0 0 0 0  PHQ-9 Score '5 15 13 12 ' 0  Difficult doing work/chores Not difficult at all Very difficult Very difficult - -  Some recent data might be hidden      Impression and Recommendations:     1. Bacterial respiratory infection     Discussed with patient has symptoms concerning for bacterial lower respiratory infection so will start oral antibiotic therapy. Due to extended weekend will send medrol dosepak and advised to start corticosteroid taper in case she starts experiencing worsening cough with shortness of breath or wheezing. Patient verbalized understanding. Monitor for worsening symptoms and advised should seek further evaluation by going to the ED or Urgent Care. Continue home supportive care.   - As part of my medical decision making, I reviewed the following data within the [place] History obtained from pt /family, CMA notes reviewed  and incorporated if applicable, Labs reviewed, Radiograph/ tests reviewed if applicable and OV notes from prior OV's with me, as well as any other specialists she/he has seen since seeing me last, were all reviewed and used in my medical decision making process today.    - Additionally, when appropriate, discussion had with patient regarding our treatment plan, and their biases/concerns about that plan were used in my medical decision making today.    - The patient agreed with the plan and demonstrated an understanding of the instructions.   No  barriers to understanding were identified.     - The patient was advised to call back or seek an in-person evaluation if the symptoms worsen or if the condition fails to improve as anticipated.   Return if symptoms worsen or fail to improve.    No orders of the defined types were placed in this encounter.   Meds ordered this encounter  Medications   doxycycline (VIBRA-TABS) 100 MG tablet    Sig: Take 1 tablet (100 mg total) by mouth 2 (two) times daily.    Dispense:  14 tablet    Refill:  0    Order Specific Question:   Supervising Provider    Answer:   Beatrice Lecher D [[id number]]   methylPREDNISolone (MEDROL DOSEPAK) 4 MG TBPK tablet    Sig: Take as directed on package.    Dispense:  21 tablet    Refill:  0    Order Specific Question:   Supervising Provider    Answer:   Beatrice Lecher D [[id number]]    Medications Discontinued During This Encounter  Medication Reason   COVID-19 At Home Antigen Test ([facility] COVID-19 HOME TEST) KIT Patient Preference       Time spent on telephone encounter was 8 minutes.   Note:  This note was prepared with assistance of Dragon voice recognition software. Occasional wrong-word or sound-a-like substitutions may have occurred due to the inherent limitations of voice recognition software.    The [place] was signed into law in 2016 which includes the topic of electronic health records.  This provides immediate access to information in MyChart.  This includes consultation notes, operative notes, office notes, lab results and pathology reports.  If you have any questions about what you read please let us know at your next visit or call us at the office.  We are right here with you.   __________________________________________________________________________________     Patient Care Team    Relationship Specialty Notifications Start End  Kristin Orozco, [place] PCP - General Physician Assistant  02/15/20      -Vitals  obtained; medications/ allergies reconciled;  personal medical, social, Sx etc.histories were updated by CMA, reviewed by me and are reflected in chart   Patient Active Problem List   Diagnosis Date Noted   PVCs (premature ventricular contractions) 05/14/2021   Abnormal echocardiogram 05/14/2021   Atopic dermatitis 10/19/2020   Depression, recurrent ([place]) 07/23/2019   Poor concentration 07/23/2019   GAD (generalized anxiety disorder) 12/14/2018   Lymphadenopathy 11/30/2018   Elevated BP without diagnosis of hypertension 11/30/2018   Loose stools 11/30/2018   Healthcare maintenance 07/06/2018   Thyroid nodule 07/06/2018   Stress 07/06/2018     Current Meds  Medication Sig   amphetamine-dextroamphetamine (ADDERALL XR) 10 MG 24 hr capsule TAKE 1 CAPSULE (10 MG TOTAL) BY MOUTH DAILY.   doxycycline (VIBRA-TABS) 100 MG tablet Take 1 tablet (100 mg total) by mouth 2 (two) times daily.  FLUoxetine (PROZAC) 20 MG tablet Take 1 tablet (20 mg total) by mouth daily.   LO LOESTRIN FE 1 MG-10 MCG / 10 MCG tablet TAKE 1 TABLET BY MOUTH DAILY.   methylPREDNISolone (MEDROL DOSEPAK) 4 MG TBPK tablet Take as directed on package.   ondansetron (ZOFRAN) 4 MG tablet Take 1 tablet (4 mg total) by mouth every 8 (eight) hours as needed for nausea or vomiting.     Allergies:  No Known Allergies   ROS:  See above HPI for pertinent positives and negatives   Objective:   Height '5\' 3"'  (1.6 m), weight 148 lb (67.1 kg).  (if some vitals are omitted, this means that patient was UNABLE to obtain them.) General: A & O * 3; sounds in no acute distress Respiratory: speaking in full sentences, no conversational dyspnea Psych: insight appears good, mood- appears full

## 2021-05-25 DIAGNOSIS — I493 Ventricular premature depolarization: Secondary | ICD-10-CM | POA: Diagnosis not present

## 2021-05-31 ENCOUNTER — Telehealth: Payer: No Typology Code available for payment source | Admitting: Physician Assistant

## 2021-05-31 DIAGNOSIS — J358 Other chronic diseases of tonsils and adenoids: Secondary | ICD-10-CM

## 2021-06-01 NOTE — Progress Notes (Signed)
  E-Visit for Sore Throat  We are sorry that you are not feeling well.  Here is how we plan to help!  Your symptoms indicate a likely a tonsillar stone that has become lodged within the salivary duct. Would recommend to push fluids as these commonly occur from dehydration and this one may be worse due to decongestant use from a recent infection. I also recommend sour candies such as warheads or lemon juice as sour things will increase salivation and hopefully allow the stone to move out.  For throat pain, we recommend over the counter oral pain relief medications such as acetaminophen or aspirin, or anti-inflammatory medications such as ibuprofen or naproxen sodium.  Topical treatments such as oral throat lozenges or sprays may be used as needed.  Avoid close contact with loved ones, especially the very young and elderly.    After careful review of your answers, I would not recommend and antibiotic for your condition.  Antibiotics should not be used to treat conditions that we suspect are caused by viruses like the virus that causes the common cold or flu. However, some people can have Strep with atypical symptoms. You may need formal testing in clinic or office to confirm if your symptoms continue or worsen.  Providers prescribe antibiotics to treat infections caused by bacteria. Antibiotics are very powerful in treating bacterial infections when they are used properly.  To maintain their effectiveness, they should be used only when necessary.  Overuse of antibiotics has resulted in the development of super bugs that are resistant to treatment!    Home Care: Only take medications as instructed by your medical team. Do not drink alcohol while taking these medications. A steam or ultrasonic humidifier can help congestion.  You can place a towel over your head and breathe in the steam from hot water coming from a faucet. Avoid close contacts especially the very young and the elderly. Cover your mouth  when you cough or sneeze. Always remember to wash your hands.  Get Help Right Away If: You develop worsening fever or throat pain. You develop a severe head ache or visual changes. Your symptoms persist after you have completed your treatment plan.  Make sure you Understand these instructions. Will watch your condition. Will get help right away if you are not doing well or get worse.   Thank you for choosing an e-visit.  Your e-visit answers were reviewed by a board certified advanced clinical practitioner to complete your personal care plan. Depending upon the condition, your plan could have included both over the counter or prescription medications.  Please review your pharmacy choice. Make sure the pharmacy is open so you can pick up prescription now. If there is a problem, you may contact your provider through [employer] and have the prescription routed to another pharmacy.  Your safety is important to [place]. If you have drug allergies check your prescription carefully.   For the next 24 hours you can use MyChart to ask questions about today's visit, request a non-urgent call back, or ask for a work or school excuse. You will get an email in the next two days asking about your experience. I hope that your e-visit has been valuable and will speed your recovery.   I provided 8 minutes of non face-to-face time during this encounter for chart review and documentation.

## 2021-06-07 ENCOUNTER — Other Ambulatory Visit: Payer: Self-pay | Admitting: Physician Assistant

## 2021-06-07 DIAGNOSIS — F909 Attention-deficit hyperactivity disorder, unspecified type: Secondary | ICD-10-CM

## 2021-06-08 ENCOUNTER — Other Ambulatory Visit: Payer: Self-pay

## 2021-06-08 MED ORDER — AMPHETAMINE-DEXTROAMPHET ER 10 MG PO CP24
ORAL_CAPSULE | ORAL | 0 refills | Status: DC
  Filled 2021-06-08: qty 30, 30d supply, fill #0

## 2021-06-11 ENCOUNTER — Ambulatory Visit: Payer: No Typology Code available for payment source | Admitting: Cardiology

## 2021-06-23 ENCOUNTER — Other Ambulatory Visit: Payer: Self-pay

## 2021-06-23 ENCOUNTER — Other Ambulatory Visit: Payer: Self-pay | Admitting: Family Medicine

## 2021-06-23 DIAGNOSIS — Z3041 Encounter for surveillance of contraceptive pills: Secondary | ICD-10-CM

## 2021-06-23 MED FILL — Norethin-Eth Estradiol-Fe Tab 1 MG-10 MCG (24)/10 MCG (2): ORAL | 84 days supply | Qty: 84 | Fill #3 | Status: CN

## 2021-06-24 ENCOUNTER — Other Ambulatory Visit: Payer: Self-pay

## 2021-06-25 ENCOUNTER — Encounter: Payer: Self-pay | Admitting: Family Medicine

## 2021-06-25 ENCOUNTER — Other Ambulatory Visit: Payer: Self-pay

## 2021-06-25 DIAGNOSIS — Z3041 Encounter for surveillance of contraceptive pills: Secondary | ICD-10-CM

## 2021-06-25 MED ORDER — LO LOESTRIN FE 1 MG-10 MCG / 10 MCG PO TABS
1.0000 | ORAL_TABLET | Freq: Every day | ORAL | 11 refills | Status: DC
  Filled 2021-06-25: qty 28, 28d supply, fill #0
  Filled 2021-07-01: qty 84, 84d supply, fill #0

## 2021-07-01 ENCOUNTER — Other Ambulatory Visit: Payer: Self-pay

## 2021-07-15 ENCOUNTER — Other Ambulatory Visit: Payer: Self-pay

## 2021-07-15 ENCOUNTER — Telehealth: Payer: No Typology Code available for payment source | Admitting: Family

## 2021-07-15 DIAGNOSIS — J069 Acute upper respiratory infection, unspecified: Secondary | ICD-10-CM | POA: Diagnosis not present

## 2021-07-15 MED ORDER — BENZONATATE 100 MG PO CAPS
100.0000 mg | ORAL_CAPSULE | Freq: Three times a day (TID) | ORAL | 0 refills | Status: DC | PRN
  Filled 2021-07-15: qty 20, 7d supply, fill #0

## 2021-07-15 MED ORDER — FLUTICASONE PROPIONATE 50 MCG/ACT NA SUSP
2.0000 | Freq: Every day | NASAL | 6 refills | Status: DC
  Filled 2021-07-15: qty 16, 30d supply, fill #0
  Filled 2021-12-15: qty 16, 30d supply, fill #1
  Filled 2022-01-13: qty 16, 30d supply, fill #2
  Filled 2022-03-12: qty 48, 90d supply, fill #3

## 2021-07-15 NOTE — Progress Notes (Signed)

## 2021-07-16 ENCOUNTER — Ambulatory Visit: Payer: No Typology Code available for payment source | Admitting: Cardiology

## 2021-07-16 ENCOUNTER — Encounter: Payer: Self-pay | Admitting: Cardiology

## 2021-07-16 ENCOUNTER — Other Ambulatory Visit: Payer: Self-pay

## 2021-07-16 VITALS — BP 120/72 | HR 72 | Ht 63.0 in | Wt 146.0 lb

## 2021-07-16 DIAGNOSIS — R931 Abnormal findings on diagnostic imaging of heart and coronary circulation: Secondary | ICD-10-CM | POA: Diagnosis not present

## 2021-07-16 DIAGNOSIS — I493 Ventricular premature depolarization: Secondary | ICD-10-CM | POA: Diagnosis not present

## 2021-07-16 NOTE — Assessment & Plan Note (Signed)
Mild abnormalities on echo.  Nothing overly concerning.  This does not correlate with PVCs.  No significant valvular disease.  Patient reassured.

## 2021-07-16 NOTE — Assessment & Plan Note (Signed)
She did have 1.7% PVCs noted on monitor.  Mostly monomorphic PVCs.  She mostly noted to the ventricular bigeminy and trigeminy more so than just of isolated PVCs.  Overall in the presence of a normal echocardiogram.  These are pretty much benign findings. Discussed potential triggers including caffeine, sweets, stimulants and poor sleep as well as dehydration.  Discussed the possibility of SVT triggered by PVCs and reviewed vagal maneuvers.

## 2021-07-16 NOTE — Patient Instructions (Signed)
Medication Instructions: ° °Your physician recommends that you continue on your current medications as directed. Please refer to the Current Medication list given to you today.  ° °*If you need a refill on your cardiac medications before your next appointment, please call your pharmacy* ° ° °Lab Work: ° °None ordered ° °If you have labs (blood work) drawn today and your tests are completely normal, you will receive your results only by: °MyChart Message (if you have MyChart) OR °A paper copy in the mail °If you have any lab test that is abnormal or we need to change your treatment, we will call you to review the results. ° ° °Testing/Procedures: °None ordered ° ° °Follow-Up: °At [facility], you and your health needs are our priority.  As part of our continuing mission to provide you with exceptional heart care, we have created designated Provider Care Teams.  These Care Teams include your primary Cardiologist (physician) and Advanced Practice Providers (APPs -  Physician Assistants and Nurse Practitioners) who all work together to provide you with the care you need, when you need it. ° °We recommend signing up for the patient portal called "MyChart".  Sign up information is provided on this After Visit Summary.  MyChart is used to connect with patients for Virtual Visits (Telemedicine).  Patients are able to view lab/test results, encounter notes, upcoming appointments, etc.  Non-urgent messages can be sent to your provider as well.   °To learn more about what you can do with MyChart, go to [email or web address].   ° °Your next appointment:   °As needed ° °The format for your next appointment:   °In Person ° °Provider:   °You may see David Harding, MD or one of the following Advanced Practice Providers on your designated Care Team:   °Christopher Berge, NP °Ryan Dunn, PA-C °Cadence Furth, PA-C1}  ° ° °Other Instructions °N/A °

## 2021-07-16 NOTE — Progress Notes (Signed)
Primary Care Provider: Lorrene Reid, PA-C Cardiologist: None Electrophysiologist: None  Clinic Note: Chief Complaint  Patient presents with   OTher    Follow up post [place] reviewed verbally with patient.    ===================================  ASSESSMENT/PLAN   Problem List Items Addressed This Visit       Cardiology Problems   PVCs (premature ventricular contractions) - Primary    She did have 1.7% PVCs noted on monitor.  Mostly monomorphic PVCs.  She mostly noted to the ventricular bigeminy and trigeminy more so than just of isolated PVCs.  Overall in the presence of a normal echocardiogram.  These are pretty much benign findings. Discussed potential triggers including caffeine, sweets, stimulants and poor sleep as well as dehydration.  Discussed the possibility of SVT triggered by PVCs and reviewed vagal maneuvers.        Other   Abnormal echocardiogram    Mild abnormalities on echo.  Nothing overly concerning.  This does not correlate with PVCs.  No significant valvular disease.  Patient reassured.       ===================================  HPI:    Kristin Orozco is a 26 y.o. female with a PMH below who presents today for close f/u to discuss Zio Patch Results .  Kristin Orozco was seen on May 14, 2021 at the request of Lorrene Reid, PA-C for evaluation of PVCs noted on echocardiogram.  The echocardiogram was ordered in response to an episode of chest pain and heart racing that occurred following a nightmare about her grandmother.  She was in the process of recovering from [place] at the time and woke up with her heart racing with chest pain.  Echo showed mild left atrial dilation with frequent PVCs.  No syncope or near syncope.  She was otherwise feeling fine at this time.  Just noting occasional skipped beats but no more chest tightness. -> Zio patch ordered.  Recent Hospitalizations: None  Reviewed  CV studies:    The following  studies were reviewed today: (if available, images/films reviewed: From Epic Chart or Care Everywhere) Zio Patch :Patch Wear Time:  2 days and 22 hours (2022-11-28T21:12:18-0500 to 2022-12-01T19:35:40-0500)  Predominant underlying rhythm was Sinus Rhythm. min HR of 53 bpm, max HR of 155 bpm, and avg HR of 93 bpm. Isolated VEs were occasional (1.7%, 6536), and no VE Couplets or VE Triplets were present. Ventricular Bigeminy and Trigeminy were present. Isolated SVEs were rare (<1.0%), and no SVE Couplets or SVE Triplets were present. No notable arrhythmias    Interval History:   Kristin Orozco returns today stating that she is doing pretty well.  She not really having that much of any significant palpitations.  She says he feels every now and then, especially if she is little dehydrated or tired.  She is try to cut down caffeine.  No further chest tightness or pressure.  No further heart racing.  No dizziness or lightheadedness.  No syncope or near syncope.  CV Review of Symptoms (Summary) Cardiovascular ROS: no chest pain or dyspnea on exertion positive for - irregular heartbeat and palpitations negative for - edema, loss of consciousness, orthopnea, paroxysmal nocturnal dyspnea, rapid heart rate, shortness of breath, or near syncope, TIA/amaurosis fugax or claudication  REVIEWED OF SYSTEMS   Review of Systems  Constitutional:  Negative for malaise/fatigue.  Respiratory:  Negative for shortness of breath.   Neurological:  Negative for dizziness and weakness.  Psychiatric/Behavioral:  Negative for depression. The patient is not nervous/anxious.  I have reviewed and (if needed) personally updated the patient's problem list, medications, allergies, past medical and surgical history, social and family history.   PAST MEDICAL HISTORY   Past Medical History:  Diagnosis Date   Anxiety    Syncope    Tachycardia    Thyroid disease    Hx of thyroid nodule--followed by PCP    PAST  SURGICAL HISTORY   History reviewed. No pertinent surgical history.  Immunization History  Administered Date(s) Administered   DTaP 04/25/1996, 07/13/1996, 08/24/1996, 08/23/1997, 06/06/2000   Hepatitis A 03/06/2008, 08/03/2012   Hepatitis A, Ped/Adol-2 Dose 09/17/2008   Hepatitis B July 05, 1995, 04/25/1996, 08/24/1996   HiB (PRP-OMP) 04/25/1996, 07/13/1996, 08/24/1996, 08/23/1997   IPV 04/25/1996, 07/13/1996, 02/14/1997, 06/06/2000   Influenza-Unspecified 03/07/2018, 04/06/2019, 04/11/2020, 03/30/2021   MMR 02/14/1997, 06/06/2000   Meningococcal Conjugate 08/03/2012   PFIZER(Purple Top)SARS-COV-2 Vaccination 03/07/2020, 03/28/2020   Tdap 01/31/2007   Varicella 02/14/1997, 08/03/2012    MEDICATIONS/ALLERGIES   Current Meds  Medication Sig   amphetamine-dextroamphetamine (ADDERALL XR) 10 MG 24 hr capsule TAKE 1 CAPSULE (10 MG TOTAL) BY MOUTH DAILY.   benzonatate (TESSALON PERLES) 100 MG capsule Take 1 capsule (100 mg total) by mouth 3 (three) times daily as needed.   FLUoxetine (PROZAC) 20 MG tablet Take 1 tablet (20 mg total) by mouth daily.   fluticasone (FLONASE) 50 MCG/ACT nasal spray Place 2 sprays into both nostrils daily.   LO LOESTRIN FE 1 MG-10 MCG / 10 MCG tablet Take 1 tablet by mouth daily. Brand name only- failed ortho-cyclen and micronor   ondansetron (ZOFRAN) 4 MG tablet Take 1 tablet (4 mg total) by mouth every 8 (eight) hours as needed for nausea or vomiting.    No Known Allergies  SOCIAL HISTORY/FAMILY HISTORY   Reviewed in Epic:  Pertinent findings:  Social History   Tobacco Use   Smoking status: Former    Types: Cigarettes    Quit date: 05/20/2018    Years since quitting: 3.1   Smokeless tobacco: Former    Quit date: 05/22/2019   Tobacco comments:    stopped in Nov 2019  Vaping Use   Vaping Use: Never used  Substance Use Topics   Alcohol use: Yes    Alcohol/week: 3.0 standard drinks    Types: 3 Glasses of wine per week    Comment: rarely   Drug  use: No   Social History   Social History Narrative   Patient is single, with no children   Patient is a high school graduate   Patient is right handed   Patient drinks 5 or more    OBJCTIVE -PE, EKG, labs   Wt Readings from Last 3 Encounters:  07/16/21 146 lb (66.2 kg)  05/20/21 148 lb (67.1 kg)  05/14/21 147 lb (66.7 kg)    Physical Exam: BP 120/72 (BP Location: Left Arm, Patient Position: Sitting, Cuff Size: Normal)    Pulse 72    Ht '5\' 3"'  (1.6 m)    Wt 146 lb (66.2 kg)    SpO2 99%    BMI 25.86 kg/m  Physical Exam Constitutional:      General: She is not in acute distress.    Appearance: Normal appearance. She is normal weight. She is not toxic-appearing.  HENT:     Head: Normocephalic and atraumatic.  Pulmonary:     Effort: Pulmonary effort is normal.  Neurological:     General: No focal deficit present.     Mental Status: She is alert and  oriented to person, place, and time.  Psychiatric:        Mood and Affect: Mood normal.        Behavior: Behavior normal.        Thought Content: Thought content normal.        Judgment: Judgment normal.     Adult ECG Report Not checked  Recent Labs: Reviewed Lab Results  Component Value Date   CHOL 165 09/15/2018   HDL 44 09/15/2018   LDLCALC 106 (H) 09/15/2018   TRIG 74 09/15/2018   CHOLHDL 3.8 09/15/2018   Lab Results  Component Value Date   CREATININE 0.65 04/06/2021   BUN 10 04/06/2021   NA 139 04/06/2021   K 4.4 04/06/2021   CL 101 04/06/2021   CO2 24 04/06/2021   CBC Latest Ref Rng & Units 04/06/2021 11/30/2018 09/15/2018  WBC 3.4 - 10.8 x10E3/uL 6.4 12.8(H) 8.6  Hemoglobin 11.1 - 15.9 g/dL 14.5 14.8 13.9  Hematocrit 34.0 - 46.6 % 43.4 42.2 38.9  Platelets 150 - 450 x10E3/uL 311 365 393    Lab Results  Component Value Date   HGBA1C 5.4 09/15/2018   Lab Results  Component Value Date   TSH 4.410 09/15/2018    ==================================================  COVID-19 Education: The signs and  symptoms of COVID-19 were discussed with the patient and how to seek care for testing (follow up with PCP or arrange E-visit).    I spent a total of 15 minutes with the patient spent in direct patient consultation.  Additional time spent with chart review  / charting (studies, outside notes, etc): 8 min Total Time: 23 min  Current medicines are reviewed at length with the patient today.  (+/- concerns) none  This visit occurred during the SARS-CoV-2 public health emergency.  Safety protocols were in place, including screening questions prior to the visit, additional usage of staff PPE, and extensive cleaning of exam room while observing appropriate contact time as indicated for disinfecting solutions.  Notice: This dictation was prepared with Dragon dictation along with smart phrase technology. Any transcriptional errors that result from this process are unintentional and may not be corrected upon review.  Studies Ordered:  No orders of the defined types were placed in this encounter.   Patient Instructions / Medication Changes & Studies & Tests Ordered   Patient Instructions  Medication Instructions:   Your physician recommends that you continue on your current medications as directed. Please refer to the Current Medication list given to you today.   *If you need a refill on your cardiac medications before your next appointment, please call your pharmacy*   Lab Work: None ordered  If you have labs (blood work) drawn today and your tests are completely normal, you will receive your results only by: [place] (if you have MyChart) OR A paper copy in the mail If you have any lab test that is abnormal or we need to change your treatment, we will call you to review the results.   Testing/Procedures: None ordered   Follow-Up: At [facility], you and your health needs are our priority.  As part of our continuing mission to provide you with exceptional heart care, we have  created designated Provider Care Teams.  These Care Teams include your primary Cardiologist (physician) and Advanced Practice Providers (APPs -  Physician Assistants and Nurse Practitioners) who all work together to provide you with the care you need, when you need it.  We recommend signing up for the patient portal  called "MyChart".  Sign up information is provided on this After Visit Summary.  MyChart is used to connect with patients for Virtual Visits (Telemedicine).  Patients are able to view lab/test results, encounter notes, upcoming appointments, etc.  Non-urgent messages can be sent to your provider as well.   To learn more about what you can do with MyChart, go to [email or web address].    Your next appointment:   As needed  The format for your next appointment:   In Person  Provider:   You may see Glenetta Hew, MD or one of the following Advanced Practice Providers on your designated Care Team:   Murray Hodgkins, NP Christell Faith, PA-C Cadence Kathlen Mody, PA-C1}    Other Instructions N/A      Glenetta Hew, M.D., M.S. Interventional Cardiologist   Pager # [phone] Phone # [phone] [address] [place], [place] [address]   Thank you for choosing [facility] [place]!!

## 2021-07-17 ENCOUNTER — Encounter: Payer: Self-pay | Admitting: Physician Assistant

## 2021-07-27 ENCOUNTER — Other Ambulatory Visit: Payer: Self-pay

## 2021-07-27 ENCOUNTER — Other Ambulatory Visit: Payer: Self-pay | Admitting: Physician Assistant

## 2021-07-27 DIAGNOSIS — F909 Attention-deficit hyperactivity disorder, unspecified type: Secondary | ICD-10-CM

## 2021-07-28 ENCOUNTER — Encounter: Payer: Self-pay | Admitting: Physician Assistant

## 2021-07-28 ENCOUNTER — Other Ambulatory Visit: Payer: Self-pay

## 2021-07-29 NOTE — Progress Notes (Signed)
Telehealth office visit note for Kristin Reid, PA-C- at Primary Care at [facility]   I connected with current patient today by telephone and verified that I am speaking with the correct person    Location of the patient: Home  Location of the provider: Office - This visit type was conducted due to national recommendations for restrictions regarding the COVID-19 Pandemic (e.g. social distancing) in an effort to limit this patient's exposure and mitigate transmission in our community.    - No physical exam could be performed with this format, beyond that communicated to [place] by the patient/ family members as noted.   - Additionally my office staff/ schedulers were to discuss with the patient that there may be a monetary charge related to this service, depending on their medical insurance.  My understanding is that patient understood and consented to proceed.     _________________________________________________________________________________   History of Present Illness: Patient calls in to follow up on ADHD and mood management.   ADHD: Patient reports has been without medication for about a week and has noticed without medication does not get as much done and her mind is not as clear. States the clicking sound of her mouse was driving her crazy so bought a new one that does not have clicking sensation. Since starting medication has noticed her work performance is better and able to get tasks done in a timely manner. Denies chest pain, frequent palpitations (hx of PVCs, evaluated and cleared by cardiology) or sleep disturbance.  Mood: Reports taking Prozac 10 mg (0.5 tablet 20 mg). States has noticed her being more irritable, anxious and on edge when she is on her menses. Also reports more stressed with work so unsure if contributing to her anxiety too. Is planning to start working out again with a co-worker. Stopped seeing her therapist since the pandemic because of virtual visits vs  in-person.       GAD 7 : Generalized Anxiety Score 08/05/2021 05/20/2021 04/14/2021 01/12/2021  Nervous, Anxious, on Edge 3 1 3 1   Control/stop worrying 2 1 1 1   Worry too much - different things 2 1 1 1   Trouble relaxing 2 1 1 1   Restless 2 1 1 2   Easily annoyed or irritable 2 1 3 2   Afraid - awful might happen 2 1 1 2   Total GAD 7 Score 15 7 11 10   Anxiety Difficulty Not difficult at all Somewhat difficult Very difficult Very difficult    Depression screen [facility] 2/9 08/05/2021 05/20/2021 04/14/2021 01/12/2021 10/15/2020  Decreased Interest 1 1 1 1 1   Down, Depressed, Hopeless 1 0 1 1 1   PHQ - 2 Score 2 1 2 2 2   Altered sleeping 1 0 3 2 1   Tired, decreased energy 1 1 3 2 2   Change in appetite 1 1 1 2 2   Feeling bad or failure about yourself  1 0 0 1 0  Trouble concentrating 1 2 3 2 2   Moving slowly or fidgety/restless 0 0 3 2 3   Suicidal thoughts 0 0 0 0 0  PHQ-9 Score 7 5 15 13 12   Difficult doing work/chores Not difficult at all Not difficult at all Very difficult Very difficult -  Some recent data might be hidden      Impression and Recommendations:     1. Adult ADHD   2. GAD (generalized anxiety disorder)     Adult ADHD: -Stable.  -PDMP reviewed, no aberrancies noted. Provided refill. Will  update non-opioid controlled substance contract at f/up visit (recommend in-person). -Follow up in 3 months for medication management.  GAD: -GAD-7 score of 15, higher than baseline. -In the past patient has tried and failed Lexapro, Buspar, Wellbutrin and Zoloft. Discussed changing Prozac to SNRI and reluctant to try SNRI. Encourage to re-consider seeing [facility] therapist. Patient plans to discuss with her OB/GYN premenstrual symptoms. Will continue current medication regimen and if reluctant to try SNRI then we can consider changing to Celexa or Paxil if anxiety fails to improve. Recommend to start exercise regimen.  -Will reassess symptoms and medication therapy in 3 months.   - As  part of my medical decision making, I reviewed the following data within the [place] History obtained from pt /family, CMA notes reviewed and incorporated if applicable, Labs reviewed, Radiograph/ tests reviewed if applicable and OV notes from prior OV's with me, as well as any other specialists she/he has seen since seeing me last, were all reviewed and used in my medical decision making process today.    - Additionally, when appropriate, discussion had with patient regarding our treatment plan, and their biases/concerns about that plan were used in my medical decision making today.    - The patient agreed with the plan and demonstrated an understanding of the instructions.   No barriers to understanding were identified.     - The patient was advised to call back or seek an in-person evaluation if the symptoms worsen or if the condition fails to improve as anticipated.   Return in about 3 months (around 11/02/2021) for ADHD, mood.    No orders of the defined types were placed in this encounter.   Meds ordered this encounter  Medications   amphetamine-dextroamphetamine (ADDERALL XR) 10 MG 24 hr capsule    Sig: TAKE 1 CAPSULE (10 MG TOTAL) BY MOUTH DAILY.    Dispense:  30 capsule    Refill:  0    Order Specific Question:   Supervising Provider    Answer:   Beatrice Lecher D [[id number]]    Medications Discontinued During This Encounter  Medication Reason   benzonatate (TESSALON PERLES) 100 MG capsule Completed Course   amphetamine-dextroamphetamine (ADDERALL XR) 10 MG 24 hr capsule Reorder       Time spent on telephone encounter was 14 minutes.      The [place] was signed into law in 2016 which includes the topic of electronic health records.  This provides immediate access to information in MyChart.  This includes consultation notes, operative notes, office notes, lab results and pathology reports.  If you have any questions about what you read  please let us know at your next visit or call us at the office.  We are right here with you.   __________________________________________________________________________________     Patient Care Team    Relationship Specialty Notifications Start End  Kristin Orozco, [place] PCP - General Physician Assistant  02/15/20      -Vitals obtained; medications/ allergies reconciled;  personal medical, social, Sx etc.histories were updated by CMA, reviewed by me and are reflected in chart   Patient Active Problem List   Diagnosis Date Noted   PVCs (premature ventricular contractions) 05/14/2021   Abnormal echocardiogram 05/14/2021   Atopic dermatitis 10/19/2020   Depression, recurrent ([place]) 07/23/2019   Poor concentration 07/23/2019   GAD (generalized anxiety disorder) 12/14/2018   Lymphadenopathy 11/30/2018   Elevated BP without diagnosis of hypertension 11/30/2018   Loose stools 11/30/2018  Healthcare maintenance 07/06/2018   Thyroid nodule 07/06/2018   Stress 07/06/2018     Current Meds  Medication Sig   FLUoxetine (PROZAC) 20 MG tablet Take 1 tablet (20 mg total) by mouth daily.   fluticasone (FLONASE) 50 MCG/ACT nasal spray Place 2 sprays into both nostrils daily.   LO LOESTRIN FE 1 MG-10 MCG / 10 MCG tablet Take 1 tablet by mouth daily. Brand name only- failed ortho-cyclen and micronor   ondansetron (ZOFRAN) 4 MG tablet Take 1 tablet (4 mg total) by mouth every 8 (eight) hours as needed for nausea or vomiting.   [DISCONTINUED] amphetamine-dextroamphetamine (ADDERALL XR) 10 MG 24 hr capsule TAKE 1 CAPSULE (10 MG TOTAL) BY MOUTH DAILY.     Allergies:  No Known Allergies   ROS:  See above HPI for pertinent positives and negatives   Objective:   Height 5\' 3"  (1.6 m), weight 148 lb (67.1 kg).  (if some vitals are omitted, this means that patient was UNABLE to obtain them. ) General: A & O * 3; sounds in no acute distress; in usual state of health.  Respiratory: speaking  in full sentences, no conversational dyspnea Psych: insight appears good, mood- appears full

## 2021-08-05 ENCOUNTER — Encounter: Payer: Self-pay | Admitting: Physician Assistant

## 2021-08-05 ENCOUNTER — Other Ambulatory Visit: Payer: Self-pay

## 2021-08-05 ENCOUNTER — Ambulatory Visit (INDEPENDENT_AMBULATORY_CARE_PROVIDER_SITE_OTHER): Payer: No Typology Code available for payment source | Admitting: Physician Assistant

## 2021-08-05 VITALS — Ht 63.0 in | Wt 148.0 lb

## 2021-08-05 DIAGNOSIS — F411 Generalized anxiety disorder: Secondary | ICD-10-CM | POA: Diagnosis not present

## 2021-08-05 DIAGNOSIS — F909 Attention-deficit hyperactivity disorder, unspecified type: Secondary | ICD-10-CM | POA: Diagnosis not present

## 2021-08-05 MED ORDER — AMPHETAMINE-DEXTROAMPHET ER 10 MG PO CP24
ORAL_CAPSULE | ORAL | 0 refills | Status: DC
  Filled 2021-08-05: qty 30, 30d supply, fill #0

## 2021-08-05 NOTE — Patient Instructions (Signed)
Living With Attention Deficit Hyperactivity Disorder If you have been diagnosed with attention deficit hyperactivity disorder (ADHD), you may be relieved that you now know why you have felt or behaved a certain way. Still, you may feel overwhelmed about the treatment ahead. You may also wonder how to get the support you need and how to deal with the condition day-to-day. With treatment and support, you can live with ADHD and manage your symptoms. How to manage lifestyle changes Managing stress Stress is your body's reaction to life changes and events, both good and bad. To cope with the stress of an ADHD diagnosis, it may help to: Learn more about ADHD. Exercise regularly. Even a short daily walk can lower stress levels. Participate in training or education programs (including social skills training classes) that teach you to deal with symptoms.  Medicines Your health care provider may suggest certain medicines if he or she feels that they will help to improve your condition. Stimulant medicines are usually prescribed to treat ADHD, and therapy may also be prescribed. It is important to: Avoid using alcohol and other substances that may prevent your medicines from working properly (may interact). Talk with your pharmacist or health care provider about all the medicines that you take, their possible side effects, and what medicines are safe to take together. Make it your goal to take part in all treatment decisions (shared decision-making). Ask about possible side effects of medicines that your health care provider recommends, and tell him or her how you feel about having those side effects. It is best if shared decision-making with your health care provider is part of your total treatment plan. Relationships To strengthen your relationships with family members while treating your condition, consider taking part in family therapy. You might also attend self-help groups alone or with a loved one. Be  honest about how your symptoms affect your relationships. Make an effort to communicate respectfully instead of fighting, and find ways to show others that you care. Psychotherapy may be useful in helping you cope with how ADHD affects your relationships. How to recognize changes in your condition The following signs may mean that your treatment is working well and your condition is improving: Consistently being on time for appointments. Being more organized at home and work. Other people noticing improvements in your behavior. Achieving goals that you set for yourself. Thinking more clearly. The following signs may mean that your treatment is not working very well: Feeling impatience or more confusion. Missing, forgetting, or being late for appointments. An increasing sense of disorganization and messiness. More difficulty in reaching goals that you set for yourself. Loved ones becoming angry or frustrated with you. Follow these instructions at home: Take over-the-counter and prescription medicines only as told by your health care provider. Check with your health care provider before taking any new medicines. Create structure and an organized atmosphere at home. For example: Make a list of tasks, then rank them from most important to least important. Work on one task at a time until your listed tasks are done. Make a daily schedule and follow it consistently every day. Use an appointment calendar, and check it 2 or 3 times a day to keep on track. Keep it with you when you leave the house. Create spaces where you keep certain things, and always put things back in their places after you use them. Keep all follow-up visits as told by your health care provider. This is important. Where to find support Talking to others    Keep emotion out of important discussions and speak in a calm, logical way. Listen closely and patiently to your loved ones. Try to understand their point of view, and try to  avoid getting defensive. Take responsibility for the consequences of your actions. Ask that others do not take your behaviors personally. Aim to solve problems as they come up, and express your feelings instead of bottling them up. Talk openly about what you need from your loved ones and how they can support you. Consider going to family therapy sessions or having your family meet with a specialist who deals with ADHD-related behavior problems. Finances Not all insurance plans cover mental health care, so it is important to check with your insurance carrier. If paying for co-pays or counseling services is a problem, search for a local or county mental health care center. Public mental health care services may be offered there at a low cost or no cost when you are not able to see a private health care provider. If you are taking medicine for ADHD, you may be able to get the generic form, which may be less expensive than brand-name medicine. Some makers of prescription medicines also offer help to patients who cannot afford the medicines that they need. Questions to ask your health care provider: What are the risks and benefits of taking medicines? Would I benefit from therapy? How often should I follow up with a health care provider? Contact a health care provider if: You have side effects from your medicines, such as: Repeated muscle twitches, coughing, or speech outbursts. Sleep problems. Loss of appetite. Depression. New or worsening behavior problems. Dizziness. Unusually fast heartbeat. Stomach pains. Headaches. Get help right away if: You have a severe reaction to a medicine. Your behavior suddenly gets worse. Summary With treatment and support, you can live with ADHD and manage your symptoms. The medicines that are most often prescribed for ADHD are stimulants. Consider taking part in family therapy or self-help groups with family members or friends. When you talk with friends  and family about your ADHD, be patient and communicate openly. Take over-the-counter and prescription medicines only as told by your health care provider. Check with your health care provider before taking any new medicines. This information is not intended to replace advice given to you by your health care provider. Make sure you discuss any questions you have with your health care provider. Document Revised: 11/28/2019 Document Reviewed: 11/28/2019 Elsevier Patient Education  2022 Elsevier Inc.  

## 2021-08-06 ENCOUNTER — Other Ambulatory Visit: Payer: Self-pay

## 2021-08-24 ENCOUNTER — Other Ambulatory Visit: Payer: Self-pay

## 2021-08-24 ENCOUNTER — Ambulatory Visit (INDEPENDENT_AMBULATORY_CARE_PROVIDER_SITE_OTHER): Payer: No Typology Code available for payment source | Admitting: Family Medicine

## 2021-08-24 ENCOUNTER — Other Ambulatory Visit (HOSPITAL_COMMUNITY)
Admission: RE | Admit: 2021-08-24 | Discharge: 2021-08-24 | Disposition: A | Discharge status: Home / Self Care | Payer: No Typology Code available for payment source | Source: Ambulatory Visit | Attending: Family Medicine | Admitting: Family Medicine

## 2021-08-24 ENCOUNTER — Encounter: Payer: Self-pay | Admitting: Family Medicine

## 2021-08-24 VITALS — BP 153/96 | HR 86 | Wt 148.4 lb

## 2021-08-24 DIAGNOSIS — N898 Other specified noninflammatory disorders of vagina: Secondary | ICD-10-CM | POA: Insufficient documentation

## 2021-08-24 DIAGNOSIS — F3281 Premenstrual dysphoric disorder: Secondary | ICD-10-CM

## 2021-08-24 DIAGNOSIS — Z01419 Encounter for gynecological examination (general) (routine) without abnormal findings: Secondary | ICD-10-CM | POA: Diagnosis not present

## 2021-08-24 MED ORDER — DROSPIRENONE-ETHINYL ESTRADIOL 3-0.02 MG PO TABS
1.0000 | ORAL_TABLET | Freq: Every day | ORAL | 11 refills | Status: DC
  Filled 2021-08-24: qty 84, 84d supply, fill #0
  Filled 2021-11-16: qty 84, 84d supply, fill #1
  Filled 2022-01-20: qty 84, 84d supply, fill #2
  Filled 2022-04-08: qty 84, 84d supply, fill #3

## 2021-08-24 NOTE — Patient Instructions (Signed)
Ask about Tdap/Td (tetanus shots) -- Myrle Sheng at Work  Ask about Gardasil at age 26-13yo

## 2021-08-24 NOTE — Progress Notes (Signed)
GYNECOLOGY ANNUAL PREVENTATIVE CARE ENCOUNTER NOTE  Subjective:   Kristin Orozco is a 26 y.o. G0P0000 female here for a routine annual gynecologic exam.  Current complaints: vaginal discharge.   Denies abnormal vaginal bleeding, discharge, pelvic pain, problems with intercourse or other gynecologic concerns.    Gynecologic History Patient's last menstrual period was 08/10/2021. Contraception: OCP (estrogen/progesterone) Last Pap: 2020. Results were: normal Last mammogram: NA.   Health Maintenance Due  Topic Date Due   HPV VACCINES (1 - 2-dose series) Never done   HIV Screening  Never done   Hepatitis C Screening  Never done   TETANUS/TDAP  01/30/2017   COVID-19 Vaccine (3 - Booster for Pfizer series) 05/23/2020   PAP-Cervical Cytology Screening  08/18/2021   PAP SMEAR-Modifier  08/18/2021    The following portions of the patient's history were reviewed and updated as appropriate: allergies, current medications, past family history, past medical history, past social history, past surgical history and problem list.  Review of Systems Pertinent items are noted in HPI.   Objective:  BP (!) 153/96    Pulse 86    Wt 148 lb 6.4 oz (67.3 kg)    LMP 08/10/2021    BMI 26.29 kg/m  CONSTITUTIONAL: Well-developed, well-nourished female in no acute distress.  HENT:  Normocephalic, atraumatic, External right and left ear normal. Oropharynx is clear and moist EYES:  No scleral icterus.  NECK: Normal range of motion, supple, no masses.  Normal thyroid.  SKIN: Skin is warm and dry. No rash noted. Not diaphoretic. No erythema. No pallor. NEUROLOGIC: Alert and oriented to person, place, and time. Normal reflexes, muscle tone coordination. No cranial nerve deficit noted. PSYCHIATRIC: Normal mood and affect. Normal behavior. Normal judgment and thought content. CARDIOVASCULAR: Normal heart rate noted, regular rhythm. 2+ distal pulses. RESPIRATORY: Effort and breath sounds normal, no problems  with respiration noted. BREASTS: Symmetric in size. No masses, skin changes, nipple drainage, or lymphadenopathy. ABDOMEN: Soft,  no distention noted.  No tenderness, rebound or guarding.  PELVIC: Normal appearing external genitalia; normal appearing vaginal mucosa and cervix.  No abnormal discharge noted.  Pap smear obtained.  Normal uterine size, no other palpable masses, no uterine or adnexal tenderness. MUSCULOSKELETAL: Normal range of motion.    Assessment and Plan:  1) Annual gynecologic examination with pap smear:  Will follow up results of pap smear and manage accordingly. Declines STI screen.  Routine preventative health maintenance measures emphasized.  2) Contraception counseling: Reviewed all forms of birth control options available including abstinence; over the counter/barrier methods; hormonal contraceptive medication including pill, patch, ring, injection,contraceptive implant; hormonal and nonhormonal IUDs; permanent sterilization options including vasectomy and the various tubal sterilization modalities. Risks and benefits reviewed.  Questions were answered.  Written information was also given to the patient to review.  Patient desires OCP, this was prescribed for patient. She will follow up in  1 year for surveillance.  She was told to call with any further questions, or with any concerns about this method of contraception.  Emphasized use of condoms 100% of the time for STI prevention.  1. Well woman exam with routine gynecological exam - Cytology - PAP - Cervicovaginal ancillary only  2. Vaginal discharge - Cervicovaginal ancillary only  3. Premenstrual dysphoric disorder - drospirenone-ethinyl estradiol (YAZ) 3-0.02 MG tablet; Take 1 tablet by mouth daily.  Dispense: 28 tablet; Refill: 11  Please refer to After Visit Summary for other counseling recommendations.   Return in about 1 year (around 08/24/2022)  for Yearly wellness exam.  Federico Flake, MD, MPH,  AB[facility] Attending Physician [facility]

## 2021-08-24 NOTE — Progress Notes (Signed)
Patient presents  for annual exam. Pap due, desires screen for BV. Reports discharge. Satisfied with ocp.  [id number]

## 2021-08-25 ENCOUNTER — Other Ambulatory Visit: Payer: Self-pay

## 2021-08-26 LAB — CERVICOVAGINAL ANCILLARY ONLY
Bacterial Vaginitis (gardnerella): NEGATIVE
Candida Glabrata: NEGATIVE
Candida Vaginitis: NEGATIVE
Comment: NEGATIVE
Comment: NEGATIVE
Comment: NEGATIVE

## 2021-08-27 LAB — CYTOLOGY - PAP: Diagnosis: NEGATIVE

## 2021-09-06 ENCOUNTER — Other Ambulatory Visit: Payer: Self-pay | Admitting: Physician Assistant

## 2021-09-06 DIAGNOSIS — F909 Attention-deficit hyperactivity disorder, unspecified type: Secondary | ICD-10-CM

## 2021-09-07 ENCOUNTER — Other Ambulatory Visit: Payer: Self-pay

## 2021-09-07 ENCOUNTER — Other Ambulatory Visit: Payer: Self-pay | Admitting: Physician Assistant

## 2021-09-07 DIAGNOSIS — F909 Attention-deficit hyperactivity disorder, unspecified type: Secondary | ICD-10-CM

## 2021-09-07 MED FILL — Amphetamine-Dextroamphetamine Cap ER 24HR 10 MG: ORAL | 30 days supply | Qty: 30 | Fill #0 | Status: AC

## 2021-09-09 ENCOUNTER — Other Ambulatory Visit: Payer: Self-pay

## 2021-09-10 ENCOUNTER — Other Ambulatory Visit: Payer: Self-pay

## 2021-10-07 ENCOUNTER — Other Ambulatory Visit: Payer: Self-pay

## 2021-10-07 ENCOUNTER — Other Ambulatory Visit: Payer: Self-pay | Admitting: Physician Assistant

## 2021-10-07 ENCOUNTER — Encounter: Payer: Self-pay | Admitting: Physician Assistant

## 2021-10-07 ENCOUNTER — Encounter: Payer: Self-pay | Admitting: Family Medicine

## 2021-10-07 DIAGNOSIS — F909 Attention-deficit hyperactivity disorder, unspecified type: Secondary | ICD-10-CM

## 2021-10-07 DIAGNOSIS — F411 Generalized anxiety disorder: Secondary | ICD-10-CM

## 2021-10-07 MED ORDER — AMPHETAMINE-DEXTROAMPHET ER 10 MG PO CP24
ORAL_CAPSULE | ORAL | 0 refills | Status: DC
  Filled 2021-10-07: qty 30, 30d supply, fill #0

## 2021-10-07 MED ORDER — AMPHETAMINE-DEXTROAMPHET ER 10 MG PO CP24: ORAL_CAPSULE | ORAL | 0 refills | Status: DC

## 2021-10-07 MED ORDER — FLUOXETINE HCL 10 MG PO TABS
10.0000 mg | ORAL_TABLET | Freq: Every day | ORAL | 1 refills | Status: DC
  Filled 2021-10-07: qty 90, 90d supply, fill #0
  Filled 2022-01-29: qty 30, 30d supply, fill #1
  Filled 2022-03-25: qty 30, 30d supply, fill #2
  Filled 2022-05-29: qty 30, 30d supply, fill #3

## 2021-10-08 ENCOUNTER — Other Ambulatory Visit: Payer: Self-pay

## 2021-11-03 ENCOUNTER — Ambulatory Visit (INDEPENDENT_AMBULATORY_CARE_PROVIDER_SITE_OTHER): Payer: No Typology Code available for payment source | Admitting: Physician Assistant

## 2021-11-03 ENCOUNTER — Ambulatory Visit: Payer: No Typology Code available for payment source | Admitting: Physician Assistant

## 2021-11-03 ENCOUNTER — Encounter: Payer: Self-pay | Admitting: Physician Assistant

## 2021-11-03 ENCOUNTER — Other Ambulatory Visit: Payer: Self-pay

## 2021-11-03 VITALS — Ht 63.0 in | Wt 148.0 lb

## 2021-11-03 DIAGNOSIS — F411 Generalized anxiety disorder: Secondary | ICD-10-CM

## 2021-11-03 DIAGNOSIS — F909 Attention-deficit hyperactivity disorder, unspecified type: Secondary | ICD-10-CM

## 2021-11-03 MED ORDER — AMPHETAMINE-DEXTROAMPHET ER 10 MG PO CP24
ORAL_CAPSULE | ORAL | 0 refills | Status: DC
  Filled 2021-11-03 – 2021-11-05 (×2): qty 30, 30d supply, fill #0

## 2021-11-03 NOTE — Progress Notes (Signed)
? ?  ? ? ?Telehealth office visit note for Kristin MaskerMaritza Jennelle Pinkstaff, PA-C- [facility] ?  ?I connected with current patient today by telephone and verified that I am speaking with the correct person   ? Location of the patient: Work ? Location of the provider: Office ?- This visit type was conducted due to national recommendations for restrictions regarding the COVID-19 Pandemic (e.g. social distancing) in an effort to limit this patient's exposure and mitigate transmission in our community.    ?- No physical exam could be performed with this format, beyond that communicated to us by the patient/ family members as noted.   ?- Additionally my office staff/ schedulers were to discuss with the patient that there may be a monetary charge related to this service, depending on their medical insurance.  My understanding is that patient understood and consented to proceed.   ?  ?_________________________________________________________________________________ ? ? ?History of Present Illness: ?Patient calls in to follow-up on ADHD and mood. Patient reports her OB/GYN changed her birth control and has noticed a difference. States is not as irritable and overall feels better. Reports able to get things done without worrying too much. Continues with Prozac 10 mg. Patient reports taking Adderall XR 10 mg without issues. Sometimes will take a break from the medication during the weekends if does not have too much going on. Has been busy with work and feeling tired. States has not been experiencing palpitations since she last saw cardiology and has worked on increasing her water intake. Denies chest pain, sleep disturbance or appetite changes. ? ? ? ? ? ?  11/03/2021  ? 10:51 AM 08/24/2021  ?  1:17 PM 08/05/2021  ?  4:11 PM 05/20/2021  ?  1:15 PM  ?GAD 7 : Generalized Anxiety Score  ?Nervous, Anxious, on Edge 1 3 3 1   ?Control/stop worrying 2 3 2 1   ?Worry too much - different things 2 3 2 1   ?Trouble relaxing 2 3 2 1    ?Restless 2 3 2 1   ?Easily annoyed or irritable 2 3 2 1   ?Afraid - awful might happen 2 3 2 1   ?Total GAD 7 Score 13 21 15 7   ?Anxiety Difficulty Very difficult  Not difficult at all Somewhat difficult  ? ? ? ?  11/03/2021  ? 10:50 AM 08/24/2021  ?  1:16 PM 08/05/2021  ?  4:10 PM 05/20/2021  ?  1:14 PM 04/14/2021  ?  3:58 PM  ?Depression screen PHQ 2/9  ?Decreased Interest 1 1 1 1 1   ?Down, Depressed, Hopeless 1 1 1  0 1  ?PHQ - 2 Score 2 2 2 1 2   ?Altered sleeping 2 1 1  0 3  ?Tired, decreased energy 2 2 1 1 3   ?Change in appetite 2  1 1 1   ?Feeling bad or failure about yourself  1 1 1  0 0  ?Trouble concentrating 2 3 1 2 3   ?Moving slowly or fidgety/restless 2 3 0 0 3  ?Suicidal thoughts 0 0 0 0 0  ?PHQ-9 Score 13 12 7 5 15   ?Difficult doing work/chores Somewhat difficult  Not difficult at all Not difficult at all Very difficult  ? ? ? ? ?Impression and Recommendations:   ? ? ?1. Adult ADHD   ?2. GAD (generalized anxiety disorder)   ?  ?Adult ADHD: ?-Stable. ?-Continue current medication regimen. PDMP reviewed, no aberrancies noted. Will obtain controlled substance contract at f/up visit which recommend in-person. ?-Follow-up in 3 months  for medication management. ? ?GAD: ?-GAD-7 score has improved. Will continue current medication regimen. Will continue to monitor.  ? ? ?- As part of my medical decision making, I reviewed the following data within the electronic MEDICAL RECORD NUMBER History obtained from pt /family, CMA notes reviewed and incorporated if applicable, Labs reviewed, Radiograph/ tests reviewed if applicable and OV notes from prior OV's with me, as well as any other specialists she/he has seen since seeing me last, were all reviewed and used in my medical decision making process today.   ? ?- Additionally, when appropriate, discussion had with patient regarding our treatment plan, and their biases/concerns about that plan were used in my medical decision making today.   ? ?- The patient agreed with the plan and  demonstrated an understanding of the instructions.   No barriers to understanding were identified.  ?   ?- The patient was advised to call back or seek an in-person evaluation if the symptoms worsen or if the condition fails to improve as anticipated. ? ? ?Return in about 3 months (around 02/03/2022) for MOOD, ADHD (recommend in-person).  ? ? ?No orders of the defined types were placed in this encounter. ? ? ?Meds ordered this encounter  ?Medications  ? amphetamine-dextroamphetamine (ADDERALL XR) 10 MG 24 hr capsule  ?  Sig: TAKE 1 CAPSULE (10 MG TOTAL) BY MOUTH DAILY.  ?  Dispense:  30 capsule  ?  Refill:  0  ?  Order Specific Question:   Supervising Provider  ?  Answer:   Nani Gasser D [[id number]]  ? ? ?Medications Discontinued During This Encounter  ?Medication Reason  ? amphetamine-dextroamphetamine (ADDERALL XR) 10 MG 24 hr capsule Reorder  ?  ? ? ? ?Time spent on telephone encounter was 5 minutes. ? ? ? ? ? ?The 21st Century Cures Act was signed into law in 2016 which includes the topic of electronic health records.  This provides immediate access to information in MyChart.  This includes consultation notes, operative notes, office notes, lab results and pathology reports.  If you have any questions about what you read please let us know at your next visit or call us at the office.  We are right here with you. ? ? ?__________________________________________________________________________________ ? ? ? ? ?Patient Care Team  ?  Relationship Specialty Notifications Start End  ?Kristin Masker, PA-C PCP - General Physician Assistant  02/15/20   ? ? ? ?-Vitals obtained; medications/ allergies reconciled;  personal medical, social, Sx etc.histories were updated by CMA, reviewed by me and are reflected in chart ? ? ?Patient Active Problem List  ? Diagnosis Date Noted  ? PVCs (premature ventricular contractions) 05/14/2021  ? Abnormal echocardiogram 05/14/2021  ? Atopic dermatitis 10/19/2020  ? Depression, recurrent  (HCC) 07/23/2019  ? Poor concentration 07/23/2019  ? GAD (generalized anxiety disorder) 12/14/2018  ? Lymphadenopathy 11/30/2018  ? Elevated BP without diagnosis of hypertension 11/30/2018  ? Loose stools 11/30/2018  ? Healthcare maintenance 07/06/2018  ? Thyroid nodule 07/06/2018  ? Stress 07/06/2018  ? ? ? ?Current Meds  ?Medication Sig  ? drospirenone-ethinyl estradiol (YAZ) 3-0.02 MG tablet Take 1 tablet by mouth daily.  ? FLUoxetine (PROZAC) 10 MG tablet Take 1 tablet (10 mg total) by mouth daily.  ? fluticasone (FLONASE) 50 MCG/ACT nasal spray Place 2 sprays into both nostrils daily.  ? ondansetron (ZOFRAN) 4 MG tablet Take 1 tablet (4 mg total) by mouth every 8 (eight) hours as needed for nausea or vomiting.  ? [  DISCONTINUED] amphetamine-dextroamphetamine (ADDERALL XR) 10 MG 24 hr capsule TAKE 1 CAPSULE (10 MG TOTAL) BY MOUTH DAILY.  ? ? ? ?Allergies:  ?No Known Allergies ? ? ?ROS:  ?See above HPI for pertinent positives and negatives ? ? ?Objective:   ?Height 5\' 3"  (1.6 m), weight 148 lb (67.1 kg).  ?(if some vitals are omitted, this means that patient was UNABLE to obtain them. ) ?General: A & O * 3; sounds in no acute distress; ?Respiratory: speaking in full sentences, no conversational dyspnea; ?Psych: insight appears good, mood- appears full ? ?  ? ?

## 2021-11-03 NOTE — Patient Instructions (Signed)
Depression and Anxiety You will learn about mood disorders, how these conditions can occur in people with heart disease, and why it is important to treat these conditions. To view the content, go to this web address: [email or web address]  This video will expire on: 09/15/2023. If you need access to this video following this date, please reach out to the healthcare provider who assigned it to you. This information is not intended to replace advice given to you by your health care provider. Make sure you discuss any questions you have with your health care provider. Elsevier Patient Education  2023 Elsevier Inc.  

## 2021-11-05 ENCOUNTER — Other Ambulatory Visit: Payer: Self-pay

## 2021-11-10 ENCOUNTER — Other Ambulatory Visit: Payer: Self-pay

## 2021-11-10 MED ORDER — LORAZEPAM 1 MG PO TABS
ORAL_TABLET | ORAL | 0 refills | Status: DC
  Filled 2021-11-10: qty 2, 2d supply, fill #0

## 2021-11-16 ENCOUNTER — Other Ambulatory Visit: Payer: Self-pay

## 2021-12-04 ENCOUNTER — Telehealth: Payer: Self-pay

## 2021-12-04 NOTE — Telephone Encounter (Signed)
   Pre-operative Risk Assessment    Patient Name: Kristin Orozco  DOB: [birth year] MRN: [id number]     Request for Surgical Clearance    Procedure:  Dental Extraction - Amount of Teeth to be Pulled:  4  Date of Surgery:  Clearance TBD                                 Surgeon:  Augustine Radar, DMD, MD Surgeon's Group or Practice Name:  [facility] Phone number:  [phone] Fax number:  [phone]   Type of Clearance Requested:   - Medical    Type of Anesthesia:  General    Additional requests/questions:  Please fax a copy of most recent office note, EKG/Echo, and Holter Monitor to the surgeon's office.  Signed, Ayeisha Lindenberger C Farryn Linares   12/04/2021, 8:20 AM

## 2021-12-07 ENCOUNTER — Telehealth: Payer: Self-pay | Admitting: *Deleted

## 2021-12-07 NOTE — Telephone Encounter (Signed)
Pt agreeable to plan of care for tele pre op appt 12/09/21 @ 2 pm. Med rec and consent are done.

## 2021-12-07 NOTE — Telephone Encounter (Signed)
Primary Cardiologist:None  Chart reviewed as part of pre-operative protocol coverage. Because of Neve[place] Abramyan Alper's past medical history and time since last visit, he/she will require a virtual visit/telephone call in order to better assess preoperative cardiovascular risk.  Pre-op covering staff: - Please contact patient, obtain consent, and schedule appointment    Emmaline Life, NP-C    12/07/2021, 10:48 AM [place] [id number] Office [phone] Fax [phone]

## 2021-12-07 NOTE — Telephone Encounter (Signed)
Pt agreeable to plan of care for tele pre op appt 12/09/21 @ 2 pm. Med rec and consent are done.    Patient Consent for Virtual Visit        Kristin Orozco has provided verbal consent on 12/07/2021 for a virtual visit (video or telephone).   CONSENT FOR VIRTUAL VISIT FOR:  Kristin Orozco  By participating in this virtual visit I agree to the following:  I hereby voluntarily request, consent and authorize [facility] and its employed or contracted physicians, physician assistants, nurse practitioners or other licensed health care professionals (the Practitioner), to provide me with telemedicine health care services (the "Services") as deemed necessary by the treating Practitioner. I acknowledge and consent to receive the Services by the Practitioner via telemedicine. I understand that the telemedicine visit will involve communicating with the Practitioner through live audiovisual communication technology and the disclosure of certain medical information by electronic transmission. I acknowledge that I have been given the opportunity to request an in-person assessment or other available alternative prior to the telemedicine visit and am voluntarily participating in the telemedicine visit.  I understand that I have the right to withhold or withdraw my consent to the use of telemedicine in the course of my care at any time, without affecting my right to future care or treatment, and that the Practitioner or I may terminate the telemedicine visit at any time. I understand that I have the right to inspect all information obtained and/or recorded in the course of the telemedicine visit and may receive copies of available information for a reasonable fee.  I understand that some of the potential risks of receiving the Services via telemedicine include:  Delay or interruption in medical evaluation due to technological equipment failure or disruption; Information transmitted may not be sufficient  (e.g. poor resolution of images) to allow for appropriate medical decision making by the Practitioner; and/or  In rare instances, security protocols could fail, causing a breach of personal health information.  Furthermore, I acknowledge that it is my responsibility to provide information about my medical history, conditions and care that is complete and accurate to the best of my ability. I acknowledge that Practitioner's advice, recommendations, and/or decision may be based on factors not within their control, such as incomplete or inaccurate data provided by me or distortions of diagnostic images or specimens that may result from electronic transmissions. I understand that the practice of medicine is not an exact science and that Practitioner makes no warranties or guarantees regarding treatment outcomes. I acknowledge that a copy of this consent can be made available to me via my patient portal [facility] MyChart), or I can request a printed copy by calling the office of [facility].    I understand that my insurance will be billed for this visit.   I have read or had this consent read to me. I understand the contents of this consent, which adequately explains the benefits and risks of the Services being provided via telemedicine.  I have been provided ample opportunity to ask questions regarding this consent and the Services and have had my questions answered to my satisfaction. I give my informed consent for the services to be provided through the use of telemedicine in my medical care

## 2021-12-09 ENCOUNTER — Encounter: Payer: Self-pay | Admitting: Nurse Practitioner

## 2021-12-09 ENCOUNTER — Ambulatory Visit (INDEPENDENT_AMBULATORY_CARE_PROVIDER_SITE_OTHER): Payer: No Typology Code available for payment source | Admitting: Nurse Practitioner

## 2021-12-09 DIAGNOSIS — Z01810 Encounter for preprocedural cardiovascular examination: Secondary | ICD-10-CM | POA: Diagnosis not present

## 2021-12-09 NOTE — Progress Notes (Signed)
Virtual Visit via Telephone Note   Because of Kristin Orozco's co-morbid illnesses, she is at least at moderate risk for complications without adequate follow up.  This format is felt to be most appropriate for this patient at this time.  The patient did not have access to video technology/had technical difficulties with video requiring transitioning to audio format only (telephone).  All issues noted in this document were discussed and addressed.  No physical exam could be performed with this format.  Please refer to the patient's chart for her consent to telehealth for [facility].  Evaluation Performed:  Preoperative cardiovascular risk assessment _____________   Date:  12/09/2021   Patient ID:  Kristin Orozco, DOB 02-08-1996, MRN [id number] Patient Location:  Home Provider location:   Office  Primary Care Provider:  Mayer Masker, PA-C Primary Cardiologist:  Bryan Lemma, MD  Chief Complaint / Patient Profile   26 y.o. y/o female with a h/o PVCs, mild left atrial dilatation on echo, tachycardia, thyroid disease, who is pending dental extraction of 4 teeth and presents today for telephonic preoperative cardiovascular risk assessment.  Past Medical History    Past Medical History:  Diagnosis Date   Anxiety    Syncope    Tachycardia    Thyroid disease    Hx of thyroid nodule--followed by PCP   No past surgical history on file.  Allergies  No Known Allergies  History of Present Illness    Kristin Orozco is a 25 y.o. female who presents via audio/video conferencing for a telehealth visit today.  Pt was last seen in cardiology clinic on 07/16/2021 by Dr. Rennis Golden.  At that time Kristin Orozco was doing well.  The patient is now pending procedure as outlined above. Since her last visit, she denies chest pain, shortness of breath, lower extremity edema, fatigue, palpitations, melena, hematuria, hemoptysis, diaphoresis, weakness, presyncope, syncope, orthopnea,  and PND.  Home Medications    Prior to Admission medications   Medication Sig Start Date End Date Taking? Authorizing Provider  amphetamine-dextroamphetamine (ADDERALL XR) 10 MG 24 hr capsule TAKE 1 CAPSULE (10 MG TOTAL) BY MOUTH DAILY. 11/03/21   Abonza, Maritza, PA-C  drospirenone-ethinyl estradiol (YAZ) 3-0.02 MG tablet Take 1 tablet by mouth daily. 08/24/21   Federico Flake, MD  FLUoxetine (PROZAC) 10 MG tablet Take 1 tablet (10 mg total) by mouth daily. 10/07/21   Abonza, Kandis Cocking, PA-C  fluticasone (FLONASE) 50 MCG/ACT nasal spray Place 2 sprays into both nostrils daily. 07/15/21   Jannifer Rodney A, FNP  LORazepam (ATIVAN) 1 MG tablet TAKE ONE TAB 2 HRS BEFORE DENTAL APPT. MUST HAVE DRIVER TO AND FROM APPT. 11/10/21     ondansetron (ZOFRAN) 4 MG tablet Take 1 tablet (4 mg total) by mouth every 8 (eight) hours as needed for nausea or vomiting. 08/15/18   Ofilia Neas, PA-C  norgestimate-ethinyl estradiol (ORTHO-CYCLEN) 0.25-35 MG-MCG tablet Take 1 tablet by mouth daily. 08/20/20 08/22/20  Federico Flake, MD    Physical Exam    Vital Signs:  Kristin Orozco does not have vital signs available for review today.  Given telephonic nature of communication, physical exam is limited. AAOx3. NAD. Normal affect.  Speech and respirations are unlabored.  Accessory Clinical Findings    None  Assessment & Plan    1.  Preoperative Cardiovascular Risk Assessment: She is doing well from a cardiac perspective and may proceed without further testing. According to the Revised Cardiac Risk Index (RCRI), her Perioperative  Risk of Major Cardiac Event is (%): 0.4 Her Functional Capacity in METs is: 7.59 according to the Duke Activity Status Index (DASI).   A copy of this note will be routed to requesting surgeon.  Time:   Today, I have spent 10 minutes with the patient with telehealth technology discussing medical history, symptoms, and management plan.     Levi Aland,  NP-C    12/09/2021, 2:14 PM [facility] [address] Office [phone] Fax [phone]

## 2021-12-15 ENCOUNTER — Other Ambulatory Visit: Payer: Self-pay | Admitting: Physician Assistant

## 2021-12-15 DIAGNOSIS — F909 Attention-deficit hyperactivity disorder, unspecified type: Secondary | ICD-10-CM

## 2021-12-16 ENCOUNTER — Other Ambulatory Visit: Payer: Self-pay

## 2021-12-16 MED ORDER — AMPHETAMINE-DEXTROAMPHET ER 10 MG PO CP24
ORAL_CAPSULE | ORAL | 0 refills | Status: DC
  Filled 2021-12-16: qty 30, 30d supply, fill #0

## 2022-01-05 NOTE — Telephone Encounter (Signed)
I s/w the pt and she has been informed that we had faxed over clearance notes after her appt on 12/07/21 with Eligha Bridegroom, NP. I assured the pt that I will re-fax the clearance notes again today. Pt thanked me for the call today as well. She states she had not heard from the [facility] office yet either. Pt again thanked me for the help.

## 2022-01-05 NOTE — Telephone Encounter (Signed)
Kristin Orozco from requesting office is requesting clearance be faxed to number listed in clearance. Please advise.

## 2022-01-13 ENCOUNTER — Other Ambulatory Visit: Payer: Self-pay | Admitting: Physician Assistant

## 2022-01-13 ENCOUNTER — Other Ambulatory Visit: Payer: Self-pay

## 2022-01-13 DIAGNOSIS — F909 Attention-deficit hyperactivity disorder, unspecified type: Secondary | ICD-10-CM

## 2022-01-13 MED ORDER — AMPHETAMINE-DEXTROAMPHET ER 10 MG PO CP24
ORAL_CAPSULE | ORAL | 0 refills | Status: DC
  Filled 2022-01-13: qty 30, 30d supply, fill #0

## 2022-01-14 ENCOUNTER — Other Ambulatory Visit: Payer: Self-pay

## 2022-01-21 ENCOUNTER — Other Ambulatory Visit: Payer: Self-pay

## 2022-01-25 ENCOUNTER — Other Ambulatory Visit: Payer: Self-pay

## 2022-01-27 ENCOUNTER — Encounter: Payer: Self-pay | Admitting: Physician Assistant

## 2022-01-27 ENCOUNTER — Other Ambulatory Visit: Payer: Self-pay

## 2022-01-27 ENCOUNTER — Ambulatory Visit (INDEPENDENT_AMBULATORY_CARE_PROVIDER_SITE_OTHER): Payer: No Typology Code available for payment source | Admitting: Physician Assistant

## 2022-01-27 VITALS — Ht 63.0 in | Wt 148.0 lb

## 2022-01-27 DIAGNOSIS — F411 Generalized anxiety disorder: Secondary | ICD-10-CM | POA: Diagnosis not present

## 2022-01-27 DIAGNOSIS — F909 Attention-deficit hyperactivity disorder, unspecified type: Secondary | ICD-10-CM

## 2022-01-27 IMAGING — CR DG CHEST 2V
1 series · 2 of 2 positions shown · non-contrast
Comparison: No priors.

CLINICAL DATA: 25-year-old female with history of COVID infection
on 04/04/2021. Chest tightness and congestion.

EXAM:
CHEST - 2 VIEW

[Series 1: dg chest 2 view · 0.14mm/px · 2 of 2 slices shown]
[im 1/2]
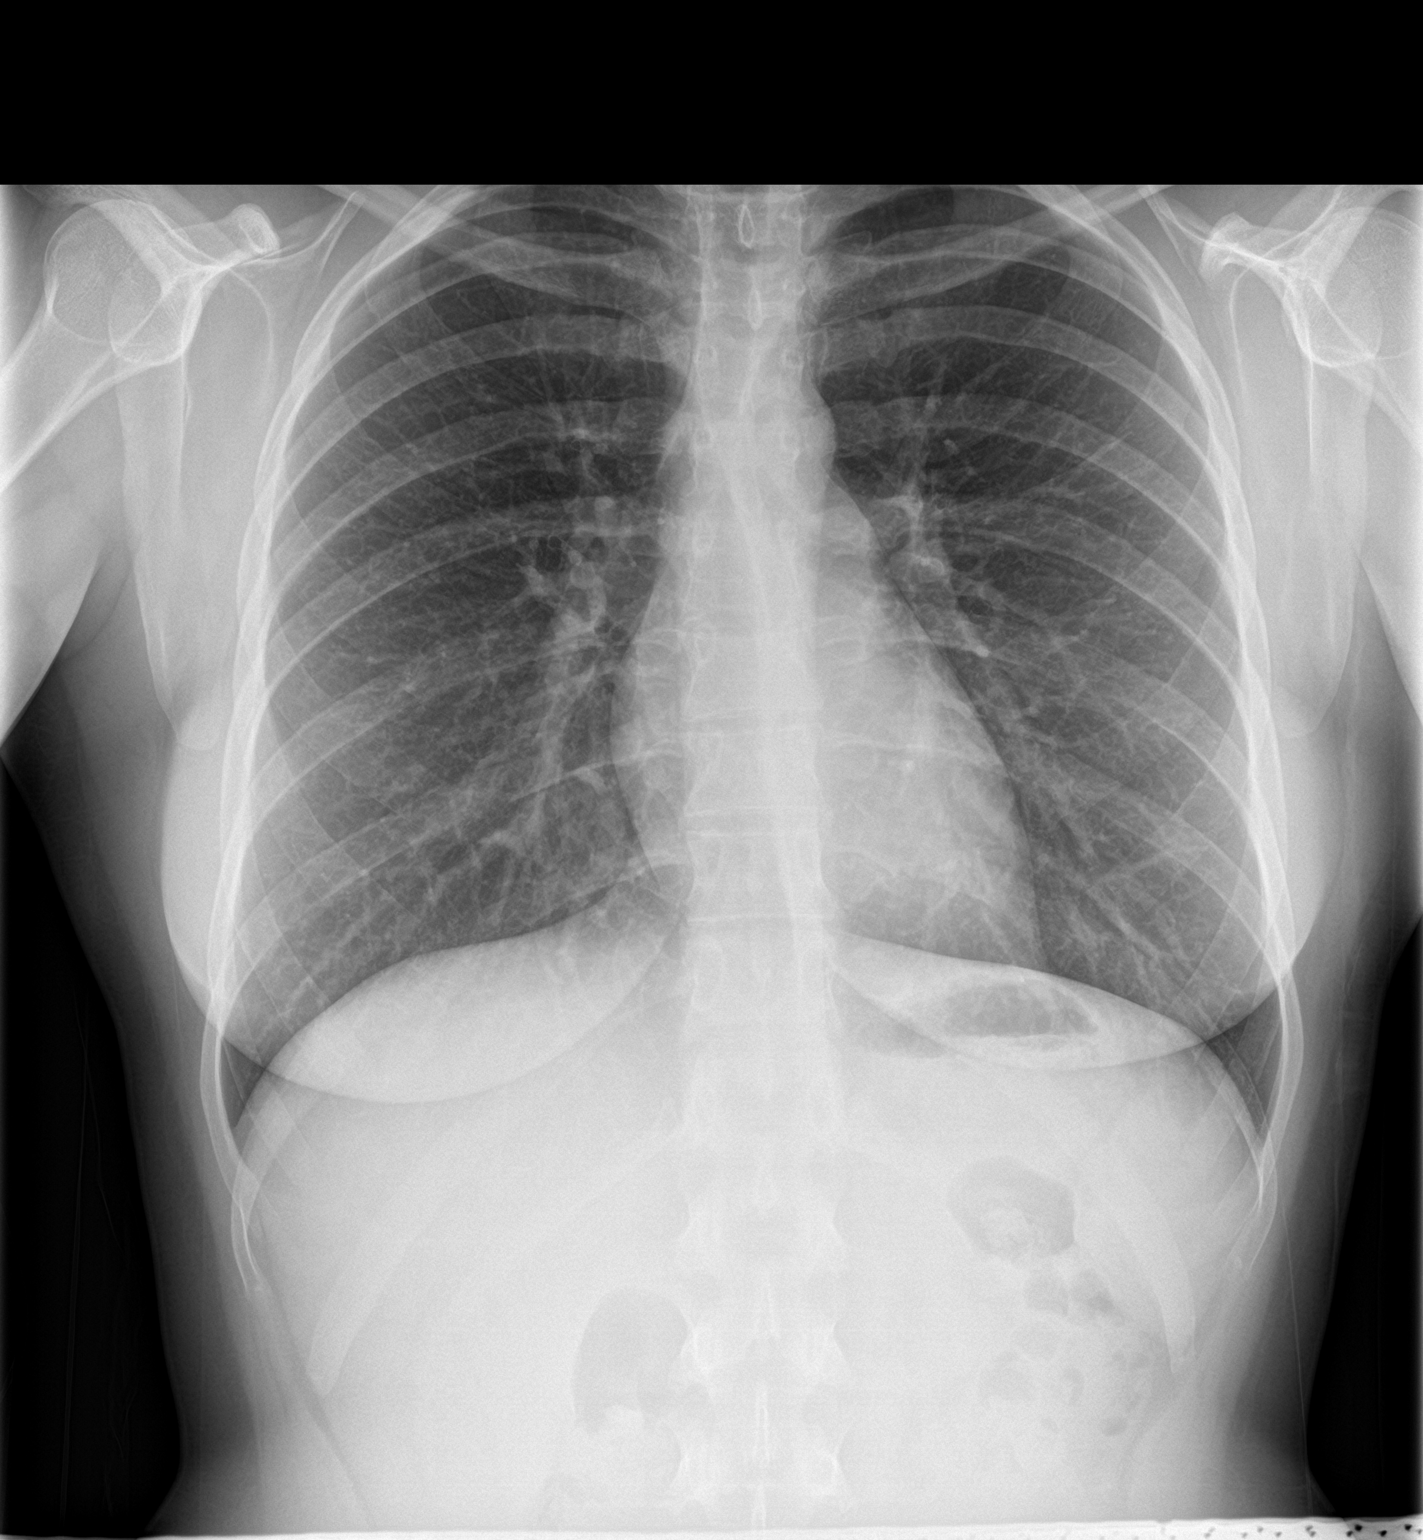
[im 2/2]
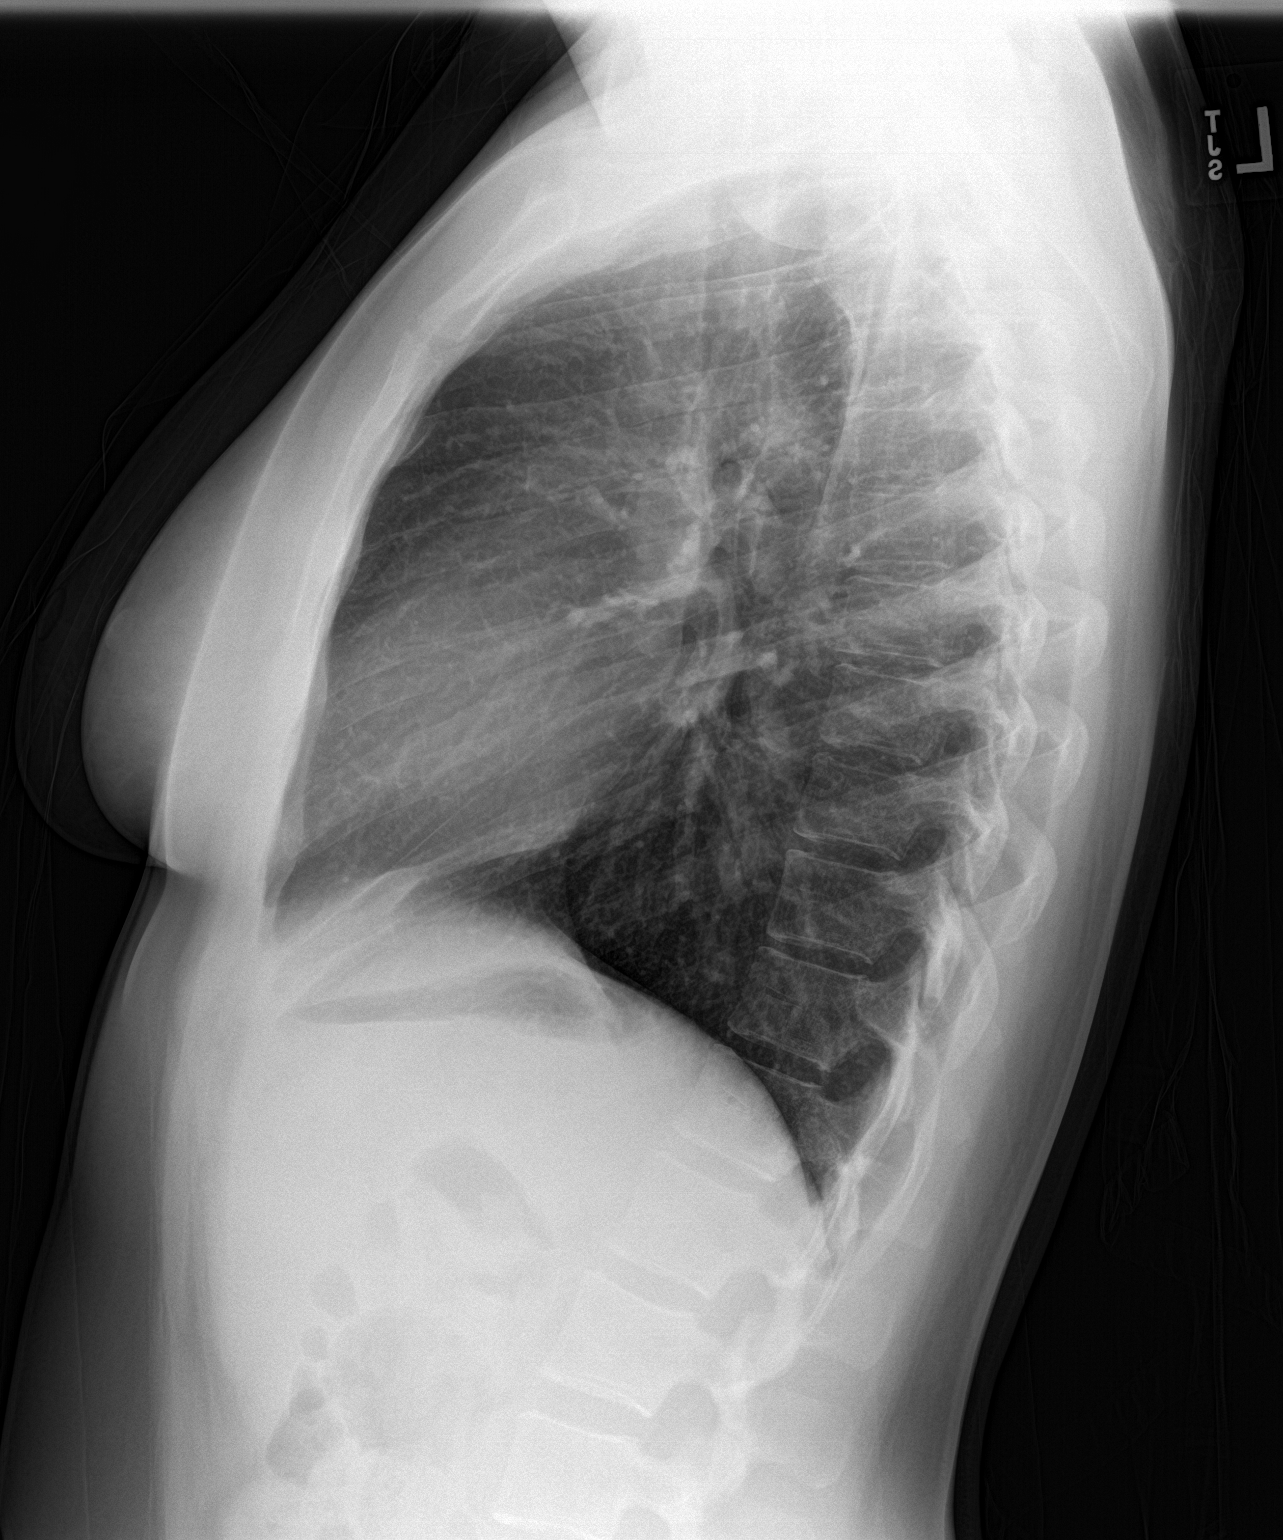

[2 of 2 positions shown; findings below may reference images not displayed]

FINDINGS: Lung volumes are normal. No consolidative airspace disease. No
pleural effusions. No pneumothorax. No pulmonary nodule or mass
noted. Pulmonary vasculature and the cardiomediastinal silhouette
are within normal limits.
IMPRESSION: No radiographic evidence of acute cardiopulmonary disease.

## 2022-01-27 NOTE — Patient Instructions (Signed)
Living With Attention Deficit Hyperactivity Disorder If you have been diagnosed with attention deficit hyperactivity disorder (ADHD), you may be relieved that you now know why you have felt or behaved a certain way. Still, you may feel overwhelmed about the treatment ahead. You may also wonder how to get the support you need and how to deal with the condition day-to-day. With treatment and support, you can live with ADHD and manage your symptoms. How to manage lifestyle changes Managing stress Stress is your body's reaction to life changes and events, both good and bad. To cope with the stress of an ADHD diagnosis, it may help to: Learn more about ADHD. Exercise regularly. Even a short daily walk can lower stress levels. Participate in training or education programs (including social skills training classes) that teach you to deal with symptoms.  Medicines Your health care provider may suggest certain medicines if he or she feels that they will help to improve your condition. Stimulant medicines are usually prescribed to treat ADHD, and therapy may also be prescribed. It is important to: Avoid using alcohol and other substances that may prevent your medicines from working properly (may interact). Talk with your pharmacist or health care provider about all the medicines that you take, their possible side effects, and what medicines are safe to take together. Make it your goal to take part in all treatment decisions (shared decision-making). Ask about possible side effects of medicines that your health care provider recommends, and tell him or her how you feel about having those side effects. It is best if shared decision-making with your health care provider is part of your total treatment plan. Relationships To strengthen your relationships with family members while treating your condition, consider taking part in family therapy. You might also attend self-help groups alone or with a loved one. Be  honest about how your symptoms affect your relationships. Make an effort to communicate respectfully instead of fighting, and find ways to show others that you care. Psychotherapy may be useful in helping you cope with how ADHD affects your relationships. How to recognize changes in your condition The following signs may mean that your treatment is working well and your condition is improving: Consistently being on time for appointments. Being more organized at home and work. Other people noticing improvements in your behavior. Achieving goals that you set for yourself. Thinking more clearly. The following signs may mean that your treatment is not working very well: Feeling impatience or more confusion. Missing, forgetting, or being late for appointments. An increasing sense of disorganization and messiness. More difficulty in reaching goals that you set for yourself. Loved ones becoming angry or frustrated with you. Follow these instructions at home: Take over-the-counter and prescription medicines only as told by your health care provider. Check with your health care provider before taking any new medicines. Create structure and an organized atmosphere at home. For example: Make a list of tasks, then rank them from most important to least important. Work on one task at a time until your listed tasks are done. Make a daily schedule and follow it consistently every day. Use an appointment calendar, and check it 2 or 3 times a day to keep on track. Keep it with you when you leave the house. Create spaces where you keep certain things, and always put things back in their places after you use them. Keep all follow-up visits as told by your health care provider. This is important. Where to find support Talking to others    Keep emotion out of important discussions and speak in a calm, logical way. Listen closely and patiently to your loved ones. Try to understand their point of view, and try to  avoid getting defensive. Take responsibility for the consequences of your actions. Ask that others do not take your behaviors personally. Aim to solve problems as they come up, and express your feelings instead of bottling them up. Talk openly about what you need from your loved ones and how they can support you. Consider going to family therapy sessions or having your family meet with a specialist who deals with ADHD-related behavior problems. Finances Not all insurance plans cover mental health care, so it is important to check with your insurance carrier. If paying for co-pays or counseling services is a problem, search for a local or county mental health care center. Public mental health care services may be offered there at a low cost or no cost when you are not able to see a private health care provider. If you are taking medicine for ADHD, you may be able to get the generic form, which may be less expensive than brand-name medicine. Some makers of prescription medicines also offer help to patients who cannot afford the medicines that they need. Questions to ask your health care provider: What are the risks and benefits of taking medicines? Would I benefit from therapy? How often should I follow up with a health care provider? Contact a health care provider if: You have side effects from your medicines, such as: Repeated muscle twitches, coughing, or speech outbursts. Sleep problems. Loss of appetite. Depression. New or worsening behavior problems. Dizziness. Unusually fast heartbeat. Stomach pains. Headaches. Get help right away if: You have a severe reaction to a medicine. Your behavior suddenly gets worse. Summary With treatment and support, you can live with ADHD and manage your symptoms. The medicines that are most often prescribed for ADHD are stimulants. Consider taking part in family therapy or self-help groups with family members or friends. When you talk with friends  and family about your ADHD, be patient and communicate openly. Take over-the-counter and prescription medicines only as told by your health care provider. Check with your health care provider before taking any new medicines. This information is not intended to replace advice given to you by your health care provider. Make sure you discuss any questions you have with your health care provider. Document Revised: 10/26/2019 Document Reviewed: 11/28/2019 Elsevier Patient Education  2023 Elsevier Inc.  

## 2022-01-27 NOTE — Progress Notes (Signed)
Telehealth office visit note for Mayer Masker, PA-C- at Primary Care at [facility]   I connected with current patient today by telephone and verified that I am speaking with the correct person    Location of the patient: Work  Location of the provider: Office - This visit type was conducted due to national recommendations for restrictions regarding the COVID-19 Pandemic (e.g. social distancing) in an effort to limit this patient's exposure and mitigate transmission in our community.    - No physical exam could be performed with this format, beyond that communicated to [place] by the patient/ family members as noted.   - Additionally my office staff/ schedulers were to discuss with the patient that there may be a monetary charge related to this service, depending on their medical insurance.  My understanding is that patient understood and consented to proceed.     _________________________________________________________________________________   History of Present Illness: Patient presents for follow-up on mood and medication management. Patient reports is planning to have wisdom tooth surgery and has been cleared by cardiology. States Adderall XR continues to do fine. Still able to focus at work but does seem to be more tired in the afternoon. But has been more active with getting things done at home. No chest pain, sleep disturbance or worsening mood. Patient reports compliance with Prozac 10 mg. Mood has been stable. No SI/HI.       11/03/2021   10:51 AM 08/24/2021    1:17 PM 08/05/2021    4:11 PM 05/20/2021    1:15 PM  GAD 7 : Generalized Anxiety Score  Nervous, Anxious, on Edge 1 3 3 1   Control/stop worrying 2 3 2 1   Worry too much - different things 2 3 2 1   Trouble relaxing 2 3 2 1   Restless 2 3 2 1   Easily annoyed or irritable 2 3 2 1   Afraid - awful might happen 2 3 2 1   Total GAD 7 Score 13 21 15 7   Anxiety Difficulty Very difficult  Not difficult at all Somewhat  difficult       01/27/2022    1:09 PM 11/03/2021   10:50 AM 08/24/2021    1:16 PM 08/05/2021    4:10 PM 05/20/2021    1:14 PM  Depression screen PHQ 2/9  Decreased Interest 0 1 1 1 1   Down, Depressed, Hopeless 0 1 1 1  0  PHQ - 2 Score 0 2 2 2 1   Altered sleeping  2 1 1  0  Tired, decreased energy  2 2 1 1   Change in appetite  2  1 1   Feeling bad or failure about yourself   1 1 1  0  Trouble concentrating  2 3 1 2   Moving slowly or fidgety/restless  2 3 0 0  Suicidal thoughts  0 0 0 0  PHQ-9 Score  13 12 7 5   Difficult doing work/chores  Somewhat difficult  Not difficult at all Not difficult at all      Impression and Recommendations:     1. Adult ADHD   2. GAD (generalized anxiety disorder)     Adult ADHD: -Stable. Continue current medication regimen. PDMP reviewed, no aberrancies noted. Adderall XR 10 mg filled 01/14/2022 30 day supply. Will continue to monitor every 3 months.  GAD: -Patient's baseline. Continue current medication regimen. Will continue to monitor.   - As part of my medical decision making, I reviewed the following data within the electronic medical  record:  History obtained from pt /family, CMA notes reviewed and incorporated if applicable, Labs reviewed, Radiograph/ tests reviewed if applicable and OV notes from prior OV's with me, as well as any other specialists she/he has seen since seeing me last, were all reviewed and used in my medical decision making process today.    - Additionally, when appropriate, discussion had with patient regarding our treatment plan, and their biases/concerns about that plan were used in my medical decision making today.    - The patient agreed with the plan and demonstrated an understanding of the instructions.   No barriers to understanding were identified.     - The patient was advised to call back or seek an in-person evaluation if the symptoms worsen or if the condition fails to improve as anticipated.   Return in about  3 months (around 04/29/2022) for Mood, ADHD/med management .    No orders of the defined types were placed in this encounter.   No orders of the defined types were placed in this encounter.   There are no discontinued medications.     Time spent on telephone encounter was 7 minutes.      The 21st Century Cures Act was signed into law in 2016 which includes the topic of electronic health records.  This provides immediate access to information in MyChart.  This includes consultation notes, operative notes, office notes, lab results and pathology reports.  If you have any questions about what you read please let us know at your next visit or call us at the office.  We are right here with you.   __________________________________________________________________________________     Patient Care Team    Relationship Specialty Notifications Start End  Mayer Masker, [place] PCP - General Physician Assistant  02/15/20   Marykay Lex, MD PCP - Cardiology Cardiology  12/09/21      -Vitals obtained; medications/ allergies reconciled;  personal medical, social, Sx etc.histories were updated by CMA, reviewed by me and are reflected in chart   Patient Active Problem List   Diagnosis Date Noted   PVCs (premature ventricular contractions) 05/14/2021   Abnormal echocardiogram 05/14/2021   Atopic dermatitis 10/19/2020   Depression, recurrent (HCC) 07/23/2019   Poor concentration 07/23/2019   GAD (generalized anxiety disorder) 12/14/2018   Lymphadenopathy 11/30/2018   Elevated BP without diagnosis of hypertension 11/30/2018   Loose stools 11/30/2018   Healthcare maintenance 07/06/2018   Thyroid nodule 07/06/2018   Stress 07/06/2018     No outpatient medications have been marked as taking for the 01/27/22 encounter (Office Visit) with Mayer Masker, PA-C.     Allergies:  No Known Allergies   ROS:  See above HPI for pertinent positives and negatives   Objective:   Height  5\' 3"  (1.6 m), weight 148 lb (67.1 kg).  (if some vitals are omitted, this means that patient was UNABLE to obtain them.) General: A & O * 3; sounds in no acute distress; in usual state of health.  Respiratory: speaking in full sentences, no conversational dyspnea Psych: insight appears good, mood- appears full

## 2022-01-29 ENCOUNTER — Other Ambulatory Visit: Payer: Self-pay

## 2022-02-10 ENCOUNTER — Telehealth: Payer: Self-pay | Admitting: Cardiology

## 2022-02-10 NOTE — Telephone Encounter (Signed)
[facility] stating that they are still waiting on a copy of most recent office note, EKG/Echo, and Holter Monitor to the surgeon's office to [phone]

## 2022-02-10 NOTE — Telephone Encounter (Signed)
Staff message has been sent to HIM dept to please fax the requested records to the dental office.

## 2022-02-18 ENCOUNTER — Other Ambulatory Visit: Payer: Self-pay

## 2022-02-18 ENCOUNTER — Other Ambulatory Visit: Payer: Self-pay | Admitting: Physician Assistant

## 2022-02-18 DIAGNOSIS — F909 Attention-deficit hyperactivity disorder, unspecified type: Secondary | ICD-10-CM

## 2022-02-18 MED ORDER — AMPHETAMINE-DEXTROAMPHET ER 10 MG PO CP24
ORAL_CAPSULE | ORAL | 0 refills | Status: DC
  Filled 2022-02-18: qty 30, 30d supply, fill #0

## 2022-02-19 ENCOUNTER — Other Ambulatory Visit: Payer: Self-pay

## 2022-02-22 ENCOUNTER — Other Ambulatory Visit: Payer: Self-pay

## 2022-03-10 ENCOUNTER — Encounter: Payer: Self-pay | Admitting: Physician Assistant

## 2022-03-11 ENCOUNTER — Other Ambulatory Visit: Payer: Self-pay | Admitting: Nurse Practitioner

## 2022-03-11 DIAGNOSIS — R300 Dysuria: Secondary | ICD-10-CM

## 2022-03-12 ENCOUNTER — Other Ambulatory Visit: Payer: Self-pay

## 2022-03-12 ENCOUNTER — Other Ambulatory Visit: Payer: Self-pay | Admitting: Nurse Practitioner

## 2022-03-12 DIAGNOSIS — R300 Dysuria: Secondary | ICD-10-CM

## 2022-03-12 MED ORDER — NITROFURANTOIN MONOHYD MACRO 100 MG PO CAPS
100.0000 mg | ORAL_CAPSULE | Freq: Two times a day (BID) | ORAL | 0 refills | Status: DC
  Filled 2022-03-12: qty 10, 5d supply, fill #0

## 2022-03-12 MED ORDER — PHENAZOPYRIDINE HCL 200 MG PO TABS
200.0000 mg | ORAL_TABLET | Freq: Three times a day (TID) | ORAL | 0 refills | Status: DC | PRN
  Filled 2022-03-12: qty 10, 4d supply, fill #0

## 2022-03-25 ENCOUNTER — Other Ambulatory Visit: Payer: Self-pay

## 2022-03-25 ENCOUNTER — Other Ambulatory Visit: Payer: Self-pay | Admitting: Physician Assistant

## 2022-03-25 DIAGNOSIS — F909 Attention-deficit hyperactivity disorder, unspecified type: Secondary | ICD-10-CM

## 2022-03-25 MED ORDER — AMPHETAMINE-DEXTROAMPHET ER 10 MG PO CP24
ORAL_CAPSULE | ORAL | 0 refills | Status: DC
  Filled 2022-03-25: qty 30, 30d supply, fill #0

## 2022-04-02 ENCOUNTER — Other Ambulatory Visit: Payer: Self-pay

## 2022-04-02 ENCOUNTER — Telehealth: Payer: No Typology Code available for payment source | Admitting: Family Medicine

## 2022-04-02 DIAGNOSIS — J40 Bronchitis, not specified as acute or chronic: Secondary | ICD-10-CM | POA: Diagnosis not present

## 2022-04-02 MED ORDER — AZITHROMYCIN 250 MG PO TABS
ORAL_TABLET | ORAL | 0 refills | Status: AC
Start: 2022-04-02 — End: 2022-04-07
  Filled 2022-04-02: qty 6, 5d supply, fill #0

## 2022-04-02 MED ORDER — BENZONATATE 200 MG PO CAPS
200.0000 mg | ORAL_CAPSULE | Freq: Three times a day (TID) | ORAL | 0 refills | Status: AC | PRN
  Filled 2022-04-02: qty 30, 10d supply, fill #0

## 2022-04-02 NOTE — Progress Notes (Signed)

## 2022-04-02 NOTE — Progress Notes (Signed)
Anytime

## 2022-04-05 ENCOUNTER — Other Ambulatory Visit: Payer: Self-pay

## 2022-04-05 MED ORDER — PROMETHAZINE-DM 6.25-15 MG/5ML PO SYRP
5.0000 mL | ORAL_SOLUTION | Freq: Four times a day (QID) | ORAL | 0 refills | Status: DC | PRN
  Filled 2022-04-05: qty 118, 6d supply, fill #0

## 2022-04-05 NOTE — Addendum Note (Signed)
Addended by: Brunetta Jeans on: 04/05/2022 06:49 AM   Modules accepted: Orders

## 2022-04-09 ENCOUNTER — Other Ambulatory Visit: Payer: Self-pay

## 2022-04-09 NOTE — Progress Notes (Signed)
I have provided 5 minutes of non face to face time during this encounter for chart review and documentation.   

## 2022-04-12 ENCOUNTER — Other Ambulatory Visit: Payer: Self-pay

## 2022-04-29 ENCOUNTER — Other Ambulatory Visit: Payer: Self-pay | Admitting: Physician Assistant

## 2022-04-29 ENCOUNTER — Other Ambulatory Visit: Payer: Self-pay

## 2022-04-29 DIAGNOSIS — F909 Attention-deficit hyperactivity disorder, unspecified type: Secondary | ICD-10-CM

## 2022-04-29 MED ORDER — AMPHETAMINE-DEXTROAMPHET ER 10 MG PO CP24
ORAL_CAPSULE | ORAL | 0 refills | Status: DC
  Filled 2022-04-29: qty 30, 30d supply, fill #0

## 2022-05-12 ENCOUNTER — Encounter: Payer: Self-pay | Admitting: Physician Assistant

## 2022-05-12 ENCOUNTER — Ambulatory Visit (INDEPENDENT_AMBULATORY_CARE_PROVIDER_SITE_OTHER): Payer: No Typology Code available for payment source | Admitting: Physician Assistant

## 2022-05-12 VITALS — BP 146/88 | HR 89 | Resp 20 | Ht 63.0 in | Wt 147.0 lb

## 2022-05-12 DIAGNOSIS — R61 Generalized hyperhidrosis: Secondary | ICD-10-CM

## 2022-05-12 DIAGNOSIS — F411 Generalized anxiety disorder: Secondary | ICD-10-CM

## 2022-05-12 DIAGNOSIS — F902 Attention-deficit hyperactivity disorder, combined type: Secondary | ICD-10-CM | POA: Diagnosis not present

## 2022-05-12 NOTE — Progress Notes (Signed)
Established patient visit   Patient: Kristin Orozco   DOB: April 05, 1996   26 y.o. Female  MRN: [id number] Visit Date: 05/12/2022  Chief Complaint  Patient presents with   ADHD   Subjective    HPI  Patient presents for chronic follow-up. Reports Adderall 10 mg daily continues to help with being able to focus at work. No issues with medication. Reports does have some decreased appetite but tries to make sure she takes her medication with food. States anxiety has been stable. Continues with Prozac 10 mg daily. Patient reports having increased sweating and feeling hot that started since the summer but has been worse. No new medications recently. No chills or fever. States started new birth control February of this year. Patient reports was started at a young age of 23 due to heavy periods.       05/12/2022    4:23 PM 01/27/2022    1:09 PM 11/03/2021   10:50 AM 08/24/2021    1:16 PM 08/05/2021    4:10 PM  Depression screen PHQ 2/9  Decreased Interest 1 0 _0 Down, Depressed, Hopeless 0 0 _1 PHQ - 2 Score 1 0 _2 Altered sleeping _3 Tired, decreased energy _4 Change in appetite _5 Feeling bad or failure about yourself  0  _6 Trouble concentrating _7 Moving slowly or fidgety/restless _8 0  Suicidal thoughts 0  0 0 0  PHQ-9 Score _9 Difficult doing work/chores Very difficult  Somewhat difficult  Not difficult at all      05/12/2022    4:24 PM 11/03/2021   10:51 AM 08/24/2021    1:17 PM 08/05/2021    4:11 PM  GAD 7 : Generalized Anxiety Score  Nervous, Anxious, on Edge _10 Control/stop worrying _11 Worry too much - different things _12 Trouble relaxing _13 Restless _14 Easily annoyed or irritable _15 Afraid - awful might happen  _16 Total GAD 7 Score  _17 Anxiety Difficulty Somewhat difficult Very difficult  Not difficult at all        Medications: Outpatient Medications Prior to  Visit  Medication Sig   amphetamine-dextroamphetamine (ADDERALL XR) 10 MG 24 hr capsule TAKE 1 CAPSULE (10 MG TOTAL) BY MOUTH DAILY.   drospirenone-ethinyl estradiol (YAZ) 3-0.02 MG tablet Take 1 tablet by mouth daily.   FLUoxetine (PROZAC) 10 MG tablet Take 1 tablet (10 mg total) by mouth daily.   fluticasone (FLONASE) 50 MCG/ACT nasal spray Place 2 sprays into both nostrils daily. (Patient not taking: Reported on 05/12/2022)   LORazepam (ATIVAN) 1 MG tablet TAKE ONE TAB 2 HRS BEFORE DENTAL APPT. MUST HAVE DRIVER TO AND FROM APPT. (Patient not taking: Reported on 05/12/2022)   ondansetron (ZOFRAN) 4 MG tablet Take 1 tablet (4 mg total) by mouth every 8 (eight) hours as needed for nausea or vomiting. (Patient not taking: Reported on 05/12/2022)   [DISCONTINUED] nitrofurantoin, macrocrystal-monohydrate, (MACROBID) 100 MG capsule Take 1 capsule (100 mg total) by mouth 2 (two) times daily. (Patient not taking: Reported on 05/12/2022)   [DISCONTINUED] phenazopyridine (PYRIDIUM) 200 MG tablet Take 1 tablet (200 mg total) by mouth  3 (three) times daily as needed for pain. (Patient not taking: Reported on 05/12/2022)   [DISCONTINUED] promethazine-dextromethorphan (PROMETHAZINE-DM) 6.25-15 MG/5ML syrup Take 5 mLs by mouth 4 (four) times daily as needed for cough. (Patient not taking: Reported on 05/12/2022)   No facility-administered medications prior to visit.    Review of Systems Review of Systems:  A fourteen system review of systems was performed and found to be positive as per HPI.  Last CBC Lab Results  Component Value Date   WBC 6.4 04/06/2021   HGB 14.5 04/06/2021   HCT 43.4 04/06/2021   MCV 89 04/06/2021   MCH 29.7 04/06/2021   RDW 11.7 04/06/2021   PLT 311 71/11/2692   Last metabolic panel Lab Results  Component Value Date   GLUCOSE 82 04/06/2021   NA 139 04/06/2021   K 4.4 04/06/2021   CL 101 04/06/2021   CO2 24 04/06/2021   BUN 10 04/06/2021   CREATININE 0.65 04/06/2021    EGFR 125 04/06/2021   CALCIUM 9.4 04/06/2021   PROT 7.2 04/06/2021   ALBUMIN 4.6 04/06/2021   LABGLOB 2.6 04/06/2021   AGRATIO 1.8 04/06/2021   BILITOT <0.2 04/06/2021   ALKPHOS 93 04/06/2021   AST 12 04/06/2021   ALT 13 04/06/2021   ANIONGAP 2 (L) 12/09/2013   Last lipids Lab Results  Component Value Date   CHOL 165 09/15/2018   HDL 44 09/15/2018   LDLCALC 106 (H) 09/15/2018   TRIG 74 09/15/2018   CHOLHDL 3.8 09/15/2018   Last hemoglobin A1c Lab Results  Component Value Date   HGBA1C 5.4 09/15/2018   Last thyroid functions Lab Results  Component Value Date   TSH 4.410 09/15/2018   T3TOTAL 189 (H) 09/15/2018   Last vitamin D No results found for: "25OHVITD2", "25OHVITD3", "VD25OH"   Objective    BP (!) 146/88   Pulse 89   Resp 20   Ht _0  (1.6 m)   Wt 147 lb (66.7 kg)   SpO2 100%   BMI 26.04 kg/m  BP Readings from Last 3 Encounters:  05/12/22 (!) 146/88  08/24/21 (!) 153/96  07/16/21 120/72   Wt Readings from Last 3 Encounters:  05/12/22 147 lb (66.7 kg)  01/27/22 148 lb (67.1 kg)  11/03/21 148 lb (67.1 kg)    Physical Exam  General:  Well Developed, well nourished, appropriate for stated age.  Neuro:  Alert and oriented,  extra-ocular muscles intact  HEENT:  Normocephalic, atraumatic, neck supple  Skin:  no gross rash, warm, pink. Cardiac:  RRR, S1 S2, no murmur Respiratory: CTA B/L  Vascular:  Ext warm, no cyanosis apprec.; cap RF less 2 sec. Psych:  No HI/SI, judgement and insight good, Euthymic mood. Full Affect.   No results found for any visits on 05/12/22.  Assessment & Plan      Problem List Items Addressed This Visit       Other   GAD (generalized anxiety disorder)    -Stable. Continue fluoxetine 10 mg daily.      Attention deficit hyperactivity disorder (ADHD), combined type - Primary    -Stable. Continue Adderall XR 10 mg daily.      Other Visit Diagnoses     Hyperhidrosis          Hyperhidrosis: -Discussed with  patient potential etiologies including medication side effect. Patient plans to discuss with OB/GYN temporarily stopping birth control which is reasonable given she has been on OCP for several years. Discussed if symptoms fail to improve  then recommend further evaluation with labs. Pt verbalized understanding.    Return in about 3 months (around 08/12/2022) for ADHD, mood, med managment.        Lorrene Reid, PA-C  [facility] [phone] (phone) [phone] (fax)  [place]

## 2022-05-12 NOTE — Patient Instructions (Signed)
Oral Contraception Use Oral contraceptive pills (OCPs) are medicines that prevent pregnancy. OCPs work by: Preventing the ovaries from releasing eggs. Thickening mucus in the lower part of the uterus (cervix). This prevents sperm from entering the uterus. Thinning the lining of the uterus (endometrium). This prevents a fertilized egg from attaching to the endometrium. Discuss possible side effects of OCPs with your health care provider. It can take 2-3 months for your body to adjust to changes in hormone levels. What are the risks? OCPs can sometimes cause side effects, such as: Headache. Depression. Trouble sleeping. Nausea and vomiting. Breast tenderness. Irregular bleeding or spotting during the first several months. Bloating or fluid retention. Increase in blood pressure. OCPs with estrogen and progestins may slightly increase the risk of: Blood clots. Heart attack. Stroke. How to take OCPs Follow instructions from your health care provider about how to take your first cycle of OCPs. There are 2 types of OCPs. The first, combination OCPs, have both estrogen and progestins. The second, progestin-only pills, have only progestin. For combination OCPs, you may start the pill: On day 1 of your menstrual period. On the first Sunday after your period starts, or on the day you get your prescription. At any time of your cycle. If you start taking the pill within 5 days after the start of your period, you will not need a backup form of birth control, such as condoms. If you start at any other time of your menstrual cycle, you will need to use a backup form of birth control. For progestin-only OCPs: Ideally, you can start taking the pill on the first day of your menstrual period, but you can start it on any other day too. These pills will protect you from pregnancy after taking it for 2 days (48 hours). You can stop using a backup form of birth control after that time. It is important that you  take this pill at the same time every day. Even taking it 3 hours late can increase the risk of pregnancy. No matter which day you start the OCP, you will always start a new pack on that same day of the week. Have an extra pack of OCPs and a backup contraceptive method available in case you miss some pills or lose your OCP pack. Missed doses Follow instructions from your health care provider for missed doses. Information about missed doses can also be found in the patient information sheet that comes with your pack of pills. In general, for combined OCPs: If you forget to take the pill for 1 day, take it as soon as you can. This may mean taking 2 pills on the same day and at the same time. Take the next day's pill at the regular time. If you forget to take the pill for 2 days in a row, take 2 tablets on the day you remember and 2 tablets on the following day. A backup form of birth control should be used for 7 days after you are back on schedule. If you forget to take the pill for 3 days in a row, call your health care provider for directions on when to restart taking your pills. Do not take the missed pills. A backup form of birth control will be needed for 7 days once you restart your pills. If you use a pack that contains inactive pills and you miss 1 or more of the inactive pills, you do not need to take the missed doses. Skip them and start the new   pack on the regular day. For progestin-only OCPs: If your dose is 3 hours or more late, or if you miss 1 or more doses, take 1 missed pill as soon as you can. If you miss one or more doses, you must use a backup form of birth control. Some brands of progestin-only pills recommend using a backup form of birth control for 48 hours after a missed or late dose while others recommend 7 days. If you are not sure what to do, call your health care provider or check the patient information sheet that came with your pills. Follow these instructions at home: Do not  use any products that contain nicotine or tobacco. These include cigarettes, chewing tobacco, or vaping devices, such as e-cigarettes. If you need help quitting, ask your health care provider. Always use a condom to protect against STIs (sexually transmitted infections). Oral contraception pills do not protect against STIs. Use a calendar to mark the days of your menstrual period. Read the information sheet and directions that came with your OCP. Talk to your health care provider if you have questions. Contact a health care provider if: You develop nausea and vomiting. You have abnormal vaginal discharge or bleeding. You develop a rash. You miss your menstrual period. Depending on the type of OCP you are taking, this may be a sign of pregnancy. You are losing your hair. You need treatment for mood swings or depression. You get dizzy when taking the OCP. You develop acne after taking the OCP. You become pregnant or think you may be pregnant. You have diarrhea, constipation, and abdominal pain or cramps. You are not sure what to do after missing pills. Get help right away if: You develop chest pain. You develop shortness of breath. You have an uncontrolled or severe headache. You develop numbness or slurred speech. You develop vision or speech problems. You develop pain, redness, and swelling in your legs. You develop weakness or numbness in your arms or legs. These symptoms may represent a serious problem that is an emergency. Do not wait to see if the symptoms will go away. Get medical help right away. Call your local emergency services (911 in the [place] Do not drive yourself to the hospital. Summary Oral contraceptive pills (OCPs) are medicines that you take to prevent pregnancy. OCPs do not prevent sexually transmitted infections (STIs). Always use a condom to protect against STIs. When you start an OCP, be aware that it can take 2-3 months for your body to adjust to changes in hormone  levels. Read all the information and directions that come with your OCP. This information is not intended to replace advice given to you by your health care provider. Make sure you discuss any questions you have with your health care provider. Document Revised: 02/21/2020 Document Reviewed: 02/21/2020 Elsevier Patient Education  2023 Elsevier Inc.  

## 2022-05-12 NOTE — Assessment & Plan Note (Signed)
-  Stable. Continue fluoxetine 10 mg daily.

## 2022-05-12 NOTE — Assessment & Plan Note (Signed)
-  Stable. Continue Adderall XR 10 mg daily.

## 2022-05-24 ENCOUNTER — Ambulatory Visit: Payer: No Typology Code available for payment source | Admitting: Physician Assistant

## 2022-05-27 ENCOUNTER — Ambulatory Visit: Payer: No Typology Code available for payment source | Admitting: Nurse Practitioner

## 2022-05-29 ENCOUNTER — Other Ambulatory Visit: Payer: Self-pay | Admitting: Physician Assistant

## 2022-05-29 DIAGNOSIS — F909 Attention-deficit hyperactivity disorder, unspecified type: Secondary | ICD-10-CM

## 2022-05-30 ENCOUNTER — Other Ambulatory Visit: Payer: Self-pay | Admitting: Family Medicine

## 2022-05-30 ENCOUNTER — Other Ambulatory Visit: Payer: Self-pay

## 2022-05-30 DIAGNOSIS — F909 Attention-deficit hyperactivity disorder, unspecified type: Secondary | ICD-10-CM

## 2022-05-31 ENCOUNTER — Other Ambulatory Visit: Payer: Self-pay

## 2022-06-01 ENCOUNTER — Other Ambulatory Visit: Payer: Self-pay

## 2022-06-01 MED ORDER — LORAZEPAM 1 MG PO TABS
ORAL_TABLET | ORAL | 0 refills | Status: DC
  Filled 2022-06-01: qty 2, 2d supply, fill #0

## 2022-06-02 ENCOUNTER — Other Ambulatory Visit: Payer: Self-pay

## 2022-06-03 ENCOUNTER — Other Ambulatory Visit: Payer: Self-pay

## 2022-06-03 MED FILL — Amphetamine-Dextroamphetamine Cap ER 24HR 10 MG: ORAL | 30 days supply | Qty: 30 | Fill #0 | Status: AC

## 2022-06-04 ENCOUNTER — Other Ambulatory Visit: Payer: Self-pay

## 2022-06-22 ENCOUNTER — Telehealth: Payer: No Typology Code available for payment source | Admitting: Physician Assistant

## 2022-06-22 ENCOUNTER — Other Ambulatory Visit: Payer: Self-pay

## 2022-06-22 DIAGNOSIS — J069 Acute upper respiratory infection, unspecified: Secondary | ICD-10-CM | POA: Diagnosis not present

## 2022-06-22 MED ORDER — FLUTICASONE PROPIONATE 50 MCG/ACT NA SUSP
2.0000 | Freq: Every day | NASAL | 0 refills | Status: DC
  Filled 2022-06-22: qty 16, 30d supply, fill #0

## 2022-06-22 MED ORDER — BENZONATATE 100 MG PO CAPS
100.0000 mg | ORAL_CAPSULE | Freq: Three times a day (TID) | ORAL | 0 refills | Status: DC | PRN
  Filled 2022-06-22: qty 30, 10d supply, fill #0

## 2022-06-22 NOTE — Progress Notes (Signed)

## 2022-06-22 NOTE — Progress Notes (Signed)
I have spent 5 minutes in review of e-visit questionnaire, review and updating patient chart, medical decision making and response to patient.   Tamicka Shimon Cody Aletta Edmunds, PA-C    

## 2022-07-05 ENCOUNTER — Other Ambulatory Visit: Payer: Self-pay | Admitting: Family Medicine

## 2022-07-05 DIAGNOSIS — F909 Attention-deficit hyperactivity disorder, unspecified type: Secondary | ICD-10-CM

## 2022-07-06 ENCOUNTER — Other Ambulatory Visit: Payer: Self-pay

## 2022-07-06 MED ORDER — AMPHETAMINE-DEXTROAMPHET ER 10 MG PO CP24
10.0000 mg | ORAL_CAPSULE | Freq: Every day | ORAL | 0 refills | Status: DC
  Filled 2022-07-06: qty 30, 30d supply, fill #0

## 2022-07-27 ENCOUNTER — Other Ambulatory Visit: Payer: Self-pay | Admitting: Physician Assistant

## 2022-07-27 ENCOUNTER — Other Ambulatory Visit: Payer: Self-pay

## 2022-07-27 DIAGNOSIS — F411 Generalized anxiety disorder: Secondary | ICD-10-CM

## 2022-07-27 MED ORDER — FLUOXETINE HCL 10 MG PO TABS
10.0000 mg | ORAL_TABLET | Freq: Every day | ORAL | 1 refills | Status: DC
  Filled 2022-07-27: qty 90, 90d supply, fill #0
  Filled 2022-10-19: qty 90, 90d supply, fill #1

## 2022-07-29 ENCOUNTER — Other Ambulatory Visit (HOSPITAL_COMMUNITY): Payer: Self-pay

## 2022-07-29 MED ORDER — HYDROCODONE-ACETAMINOPHEN 7.5-325 MG PO TABS
ORAL_TABLET | ORAL | 0 refills | Status: DC
  Filled 2022-07-29: qty 6, 3d supply, fill #0

## 2022-07-29 MED ORDER — KETOROLAC TROMETHAMINE 10 MG PO TABS
10.0000 mg | ORAL_TABLET | Freq: Four times a day (QID) | ORAL | 0 refills | Status: DC
  Filled 2022-07-29: qty 16, 4d supply, fill #0

## 2022-08-02 ENCOUNTER — Other Ambulatory Visit (HOSPITAL_COMMUNITY): Payer: Self-pay

## 2022-08-02 MED ORDER — AMOXICILLIN 875 MG PO TABS
875.0000 mg | ORAL_TABLET | Freq: Two times a day (BID) | ORAL | 0 refills | Status: DC
  Filled 2022-08-02: qty 14, 7d supply, fill #0

## 2022-08-04 ENCOUNTER — Ambulatory Visit: Payer: Self-pay | Admitting: Family Medicine

## 2022-08-11 NOTE — Progress Notes (Signed)
   Established Patient Office Visit  Subjective   Patient ID: Kristin Orozco, female    DOB: 07/07/1995  Age: 27 y.o. MRN: [id number]  No chief complaint on file.   HPI  {History (Optional):23778}  ROS    Objective:     There were no vitals taken for this visit. {Vitals History (Optional):23777}  Physical Exam   No results found for any visits on 08/12/22.  {Labs (Optional):23779}  The ASCVD Risk score (Arnett DK, et al., 2019) failed to calculate for the following reasons:   The 2019 ASCVD risk score is only valid for ages 47 to 89    Assessment & Plan:   Problem List Items Addressed This Visit   None   No follow-ups on file.    Velva Harman, PA

## 2022-08-12 ENCOUNTER — Encounter: Payer: Self-pay | Admitting: Family Medicine

## 2022-08-12 ENCOUNTER — Other Ambulatory Visit: Payer: Self-pay

## 2022-08-12 ENCOUNTER — Ambulatory Visit (INDEPENDENT_AMBULATORY_CARE_PROVIDER_SITE_OTHER): Payer: 59 | Admitting: Family Medicine

## 2022-08-12 ENCOUNTER — Ambulatory Visit: Payer: Self-pay | Admitting: Physician Assistant

## 2022-08-12 VITALS — BP 153/107 | HR 97 | Resp 20 | Ht 63.0 in | Wt 142.0 lb

## 2022-08-12 DIAGNOSIS — F902 Attention-deficit hyperactivity disorder, combined type: Secondary | ICD-10-CM | POA: Diagnosis not present

## 2022-08-12 DIAGNOSIS — F411 Generalized anxiety disorder: Secondary | ICD-10-CM | POA: Diagnosis not present

## 2022-08-12 DIAGNOSIS — F909 Attention-deficit hyperactivity disorder, unspecified type: Secondary | ICD-10-CM | POA: Diagnosis not present

## 2022-08-12 DIAGNOSIS — F339 Major depressive disorder, recurrent, unspecified: Secondary | ICD-10-CM | POA: Diagnosis not present

## 2022-08-12 DIAGNOSIS — R030 Elevated blood-pressure reading, without diagnosis of hypertension: Secondary | ICD-10-CM | POA: Diagnosis not present

## 2022-08-12 MED ORDER — AMPHETAMINE-DEXTROAMPHET ER 10 MG PO CP24
10.0000 mg | ORAL_CAPSULE | Freq: Every day | ORAL | 0 refills | Status: DC
  Filled 2022-08-12: qty 30, 30d supply, fill #0

## 2022-08-12 NOTE — Assessment & Plan Note (Signed)
Increased PHQ-9 score of 16 today.  Increased Prozac to 15 mg daily

## 2022-08-12 NOTE — Assessment & Plan Note (Signed)
Stable. Continue Adderall XR 10 mg daily.

## 2022-08-12 NOTE — Assessment & Plan Note (Addendum)
Increased with GAD-7 score of 12 today.  We discussed a trial of fluoxetine 15 mg daily to try to mitigate the increase in stress in her life recently.  She said that she previously tried to go up to 20 mg daily and it made her a "zombie" but is willing to try in between at 15 mg daily.  She does not need a refill today and will follow-up in the next few months in addition to sending MyChart messages with updates if the 15 mg is working well for her.

## 2022-08-12 NOTE — Assessment & Plan Note (Signed)
Patient had elevated blood pressures of 150/87 on the left and 158/102 on the right up on arrival at the clinic.  She states that she does have a history of having an elevated blood pressure upon arrival to any medical facility and usually it does drop back down to a normal range before she leaves.  Upon repeat blood pressure remained elevated at 153/107.  I will have her monitor her blood pressures at home for 2 weeks to make sure that they are within normal range and we do not need to initiate treatment.

## 2022-08-16 ENCOUNTER — Encounter: Payer: Self-pay | Admitting: Advanced Practice Midwife

## 2022-08-16 ENCOUNTER — Other Ambulatory Visit: Payer: Self-pay

## 2022-08-19 ENCOUNTER — Ambulatory Visit: Payer: 59 | Admitting: Advanced Practice Midwife

## 2022-09-02 ENCOUNTER — Ambulatory Visit: Payer: 59 | Admitting: Advanced Practice Midwife

## 2022-09-02 ENCOUNTER — Encounter: Payer: Self-pay | Admitting: Advanced Practice Midwife

## 2022-09-02 VITALS — BP 149/89 | HR 99 | Wt 146.0 lb

## 2022-09-02 DIAGNOSIS — Z01419 Encounter for gynecological examination (general) (routine) without abnormal findings: Secondary | ICD-10-CM | POA: Diagnosis not present

## 2022-09-02 DIAGNOSIS — Z0000 Encounter for general adult medical examination without abnormal findings: Secondary | ICD-10-CM

## 2022-09-04 NOTE — Progress Notes (Signed)
GYNECOLOGY ANNUAL PREVENTATIVE CARE ENCOUNTER NOTE  History:     Kristin Orozco is a 27 y.o. G0P0000 female here for a routine annual gynecologic exam.  Current complaints: None.   Denies abnormal vaginal bleeding, discharge, pelvic pain, problems with intercourse or other gynecologic concerns.   Patient works at the [employer] center in [place]. Doesn't smoke or vape. Engaged to high school sweetheart. Happy with decision to discontinue contraception Nov 2023.    Gynecologic History No LMP recorded. (Menstrual status: Oral contraceptives). Contraception: none Last Pap: 07/2021. Result was normal with negative HPV   Obstetric History OB History  Gravida Para Term Preterm AB Living  0 0 0 0 0 0  SAB IAB Ectopic Multiple Live Births  0 0 0 0 0    Past Medical History:  Diagnosis Date   Anxiety    Syncope    Tachycardia    Thyroid disease    Hx of thyroid nodule--followed by PCP    History reviewed. No pertinent surgical history.  Current Outpatient Medications on File Prior to Visit  Medication Sig Dispense Refill   amphetamine-dextroamphetamine (ADDERALL XR) 10 MG 24 hr capsule TAKE 1 CAPSULE (10 MG TOTAL) BY MOUTH DAILY. 30 capsule 0   FLUoxetine (PROZAC) 10 MG tablet Take 1 tablet (10 mg total) by mouth daily. (Patient taking differently: Take 15 mg by mouth daily.) 90 tablet 1   [DISCONTINUED] norgestimate-ethinyl estradiol (ORTHO-CYCLEN) 0.25-35 MG-MCG tablet Take 1 tablet by mouth daily. 28 tablet 11   No current facility-administered medications on file prior to visit.    No Known Allergies  Social History:  reports that she quit smoking about 4 years ago. Her smoking use included cigarettes. She quit smokeless tobacco use about 3 years ago. She reports current alcohol use of about 3.0 standard drinks of alcohol per week. She reports that she does not use drugs.  Family History  Problem Relation Age of Onset   Hypertension Mother    Lupus Maternal Uncle     Multiple sclerosis Maternal Grandmother    Cancer Maternal Grandmother        breast and bone   Diabetes Maternal Grandmother    Breast cancer Maternal Grandmother 63       mets to bone   Thyroid disease Maternal Grandmother        hypothyroid   Heart attack Maternal Grandfather     The following portions of the patient's history were reviewed and updated as appropriate: allergies, current medications, past family history, past medical history, past social history, past surgical history and problem list.  Review of Systems Pertinent items noted in HPI and remainder of comprehensive ROS otherwise negative.  Physical Exam:  BP (!) 149/89   Pulse 99   Wt 146 lb (66.2 kg)   BMI 25.86 kg/m  CONSTITUTIONAL: Well-developed, well-nourished female in no acute distress.  HENT:  Normocephalic, atraumatic, External right and left ear normal.  EYES: Conjunctivae and EOM are normal. Pupils are equal, round, and reactive to light. No scleral icterus.  SKIN: Skin is warm and dry. No rash noted. Not diaphoretic. No erythema. No pallor. MUSCULOSKELETAL: Normal range of motion. No tenderness.  No cyanosis, clubbing, or edema. NEUROLOGIC: Alert and oriented to person, place, and time. Normal reflexes, muscle tone coordination.  PSYCHIATRIC: Normal mood and affect. Normal behavior. Normal judgment and thought content. CARDIOVASCULAR: Normal heart rate noted, regular rhythm RESPIRATORY: Clear to auscultation bilaterally. Effort and breath sounds normal, no problems with respiration noted. BREASTS:  Symmetric in size. No masses, tenderness, skin changes, nipple drainage, or lymphadenopathy bilaterally. Performed in the presence of a chaperone.   Assessment and Plan:    1. Well woman exam without gynecological exam - No concerns or needs, normal well woman - Pap not due, no complaints, pelvic exam deferred - Has PCP  Mallie Snooks, [place], MSN, CNM Certified Nurse Midwife, [facility]  for [employer], [place]

## 2022-09-15 ENCOUNTER — Other Ambulatory Visit: Payer: Self-pay

## 2022-09-15 ENCOUNTER — Other Ambulatory Visit: Payer: Self-pay | Admitting: Family Medicine

## 2022-09-15 DIAGNOSIS — F909 Attention-deficit hyperactivity disorder, unspecified type: Secondary | ICD-10-CM

## 2022-09-15 MED ORDER — AMPHETAMINE-DEXTROAMPHET ER 10 MG PO CP24
10.0000 mg | ORAL_CAPSULE | Freq: Every day | ORAL | 0 refills | Status: DC
  Filled 2022-09-15: qty 30, 30d supply, fill #0

## 2022-10-04 ENCOUNTER — Encounter: Payer: Self-pay | Admitting: Family Medicine

## 2022-10-06 ENCOUNTER — Encounter: Payer: Self-pay | Admitting: Family Medicine

## 2022-10-06 ENCOUNTER — Other Ambulatory Visit: Payer: Self-pay

## 2022-10-06 ENCOUNTER — Ambulatory Visit
Admission: EM | Admit: 2022-10-06 | Discharge: 2022-10-06 | Disposition: A | Discharge status: Home / Self Care | Payer: 59

## 2022-10-06 ENCOUNTER — Telehealth (INDEPENDENT_AMBULATORY_CARE_PROVIDER_SITE_OTHER): Payer: 59 | Admitting: Family Medicine

## 2022-10-06 ENCOUNTER — Encounter: Payer: Self-pay | Admitting: Emergency Medicine

## 2022-10-06 DIAGNOSIS — J0110 Acute frontal sinusitis, unspecified: Secondary | ICD-10-CM | POA: Diagnosis not present

## 2022-10-06 DIAGNOSIS — S0990XA Unspecified injury of head, initial encounter: Secondary | ICD-10-CM

## 2022-10-06 DIAGNOSIS — R030 Elevated blood-pressure reading, without diagnosis of hypertension: Secondary | ICD-10-CM

## 2022-10-06 DIAGNOSIS — R55 Syncope and collapse: Secondary | ICD-10-CM

## 2022-10-06 DIAGNOSIS — G44319 Acute post-traumatic headache, not intractable: Secondary | ICD-10-CM | POA: Diagnosis not present

## 2022-10-06 DIAGNOSIS — W1812XA Fall from or off toilet with subsequent striking against object, initial encounter: Secondary | ICD-10-CM

## 2022-10-06 MED ORDER — AMOXICILLIN 875 MG PO TABS
875.0000 mg | ORAL_TABLET | Freq: Two times a day (BID) | ORAL | 0 refills | Status: AC
  Filled 2022-10-06: qty 20, 10d supply, fill #0

## 2022-10-06 NOTE — Assessment & Plan Note (Signed)
Blood pressure elevated in urgent care again today at 160/101.  Patient did note that she had some over-the-counter cough and cold medicines that may have contributed to high blood pressure.  If elevated again on next office visit, I do believe that it is time to formally diagnose hypertension.  It may be reasonable to refer her to advanced hypertension clinic or investigate secondary causes of hypertension here including renal disease.

## 2022-10-06 NOTE — ED Triage Notes (Addendum)
Patient presents to [facility] for evaluation of nasal congestion, cough, bilateral ear pain.  Denies n/v/d.    Patient also endorses an episode of syncope Saturday.  She was sittingon the toilet, and next thing she remembers is waking up on the floor in front of the toilet.  She had a goose egg on her right head and a headache all day.  Unsure how long she was out.

## 2022-10-06 NOTE — ED Provider Notes (Signed)
Renaldo Fiddler    CSN: [id number] Arrival date & time: 10/06/22  1119      History   Chief Complaint Chief Complaint  Patient presents with   URI    ear pain/poppingnasal congestionday 6tried flonase, sudafed, chlormepharine, guanefesin - Entered by patient    HPI Kristin Orozco is a 27 y.o. female.  Patient presents with ear pain, congestion, sinus pressure, cough x 6 days.  Multiple OTC cold treatments attempted.  Patient also states she "passed out" while on the toilet on 10/02/2022; she fell onto her face on the floor and had a knot on her forehead that has improved.  She denies current dizziness, weakness, numbness, chest pain, shortness of breath, or other symptoms. She states she has passed out several times in the last year.  LMP: 10/02/2022.  Her medical history includes thyroid disease, tachycardia, anxiety, depression, ADHD, syncope, elevated blood pressure.  The history is provided by the patient and medical records.    Past Medical History:  Diagnosis Date   Anxiety    Syncope    Tachycardia    Thyroid disease    Hx of thyroid nodule--followed by PCP    Patient Active Problem List   Diagnosis Date Noted   Attention deficit hyperactivity disorder (ADHD), combined type 05/12/2022   PVCs (premature ventricular contractions) 05/14/2021   Abnormal echocardiogram 05/14/2021   Atopic dermatitis 10/19/2020   Depression, recurrent 07/23/2019   Poor concentration 07/23/2019   GAD (generalized anxiety disorder) 12/14/2018   Lymphadenopathy 11/30/2018   Elevated BP without diagnosis of hypertension 11/30/2018   Thyroid nodule 07/06/2018   Stress 07/06/2018    History reviewed. No pertinent surgical history.  OB History     Gravida  0   Para  0   Term  0   Preterm  0   AB  0   Living  0      SAB  0   IAB  0   Ectopic  0   Multiple  0   Live Births  0            Home Medications    Prior to Admission medications   Medication  Sig Start Date End Date Taking? Authorizing Provider  amoxicillin (AMOXIL) 875 MG tablet Take 1 tablet (875 mg total) by mouth 2 (two) times daily for 10 days. 10/06/22 10/16/22 Yes Mickie Bail, NP  Multiple Vitamin (MULTIVITAMIN) LIQD Take 5 mLs by mouth daily.   Yes [provider]  amphetamine-dextroamphetamine (ADDERALL XR) 10 MG 24 hr capsule Take 1 capsule (10 mg total) by mouth daily. 09/15/22   [name], PA  FLUoxetine (PROZAC) 10 MG tablet Take 1 tablet (10 mg total) by mouth daily. Patient taking differently: Take 15 mg by mouth daily. 07/27/22   Carlean Jews, NP  norgestimate-ethinyl estradiol (ORTHO-CYCLEN) 0.25-35 MG-MCG tablet Take 1 tablet by mouth daily. 08/20/20 08/22/20  Federico Flake, MD    Family History Family History  Problem Relation Age of Onset   Hypertension Mother    Lupus Maternal Uncle    Multiple sclerosis Maternal Grandmother    Cancer Maternal Grandmother        breast and bone   Diabetes Maternal Grandmother    Breast cancer Maternal Grandmother 63       mets to bone   Thyroid disease Maternal Grandmother        hypothyroid   Heart attack Maternal Grandfather     Social History Social  History   Tobacco Use   Smoking status: Former    Types: Cigarettes    Quit date: 05/20/2018    Years since quitting: 4.3   Smokeless tobacco: Former    Quit date: 05/22/2019   Tobacco comments:    stopped in Nov 2019  Vaping Use   Vaping Use: Some days  Substance Use Topics   Alcohol use: Yes    Alcohol/week: 3.0 standard drinks of alcohol    Types: 3 Glasses of wine per week    Comment: rarely   Drug use: No     Allergies   Patient has no known allergies.   Review of Systems Review of Systems  Constitutional:  Negative for chills and fever.  HENT:  Positive for congestion, ear pain and sinus pressure. Negative for sore throat.   Eyes:  Negative for visual disturbance.  Respiratory:  Positive for cough. Negative for  shortness of breath.   Cardiovascular:  Negative for chest pain and palpitations.  Gastrointestinal:  Negative for abdominal pain, diarrhea, nausea and vomiting.  Neurological:  Positive for syncope. Negative for dizziness, facial asymmetry, speech difficulty, weakness, light-headedness and numbness.  All other systems reviewed and are negative.    Physical Exam Triage Vital Signs ED Triage Vitals [10/06/22 1126]  Enc Vitals Group     BP      Pulse Rate 94     Resp 18     Temp 97.9 F (36.6 C)     Temp src      SpO2 99 %     Weight      Height      Head Circumference      Peak Flow      Pain Score      Pain Loc      Pain Edu?      Excl. in GC?    No data found.  Updated Vital Signs BP (!) 160/101   Pulse 94   Temp 97.9 F (36.6 C)   Resp 18   SpO2 99%   Visual Acuity Right Eye Distance:   Left Eye Distance:   Bilateral Distance:    Right Eye Near:   Left Eye Near:    Bilateral Near:     Physical Exam Constitutional:      General: She is not in acute distress.    Appearance: Normal appearance. She is not ill-appearing.  HENT:     Right Ear: Tympanic membrane normal.     Left Ear: Tympanic membrane normal.     Nose: Congestion present.     Mouth/Throat:     Mouth: Mucous membranes are moist.     Pharynx: Oropharynx is clear.  Cardiovascular:     Rate and Rhythm: Normal rate and regular rhythm.     Heart sounds: Normal heart sounds.  Pulmonary:     Effort: Pulmonary effort is normal. No respiratory distress.     Breath sounds: Normal breath sounds.  Neurological:     General: No focal deficit present.     Mental Status: She is alert and oriented to person, place, and time.     Sensory: No sensory deficit.     Motor: No weakness.     Gait: Gait normal.  Psychiatric:        Mood and Affect: Mood normal.        Behavior: Behavior normal.      UC Treatments / Results  Labs (all labs ordered are listed, but only abnormal results  are  displayed) Labs Reviewed - No data to display  EKG   Radiology No results found.  Procedures Procedures (including critical care time)  Medications Ordered in UC Medications - No data to display  Initial Impression / Assessment and Plan / UC Course  I have reviewed the triage vital signs and the nursing notes.  Pertinent labs & imaging results that were available during my care of the patient were reviewed by me and considered in my medical decision making (see chart for details).    Acute sinusitis, syncopal episode on 10/02/2022 elevated blood pressure.  Blood pressure is elevated today, 160/101.  Discussed limitations of evaluation of her syncopal episode in an urgent care setting.  Instructed her to go to the ED for evaluation.  She is agreeable to this.  Treating her sinus symptoms with 10-day course of amoxicillin.  Education provided on sinusitis and syncope.  Patient states she feels stable to drive herself to the ED.  Final Clinical Impressions(s) / UC Diagnoses   Final diagnoses:  Acute non-recurrent frontal sinusitis  Syncope, unspecified syncope type  Elevated blood pressure reading     Discharge Instructions      Go to the emergency department for evaluation of your syncopal episode on 10/02/2022.    Your blood pressure is elevated today at 140/100; repeat 160/101.       Take the amoxicillin as directed for sinus infection. Follow up with your primary care provider tomorrow.         ED Prescriptions     Medication Sig Dispense Auth. Provider   amoxicillin (AMOXIL) 875 MG tablet Take 1 tablet (875 mg total) by mouth 2 (two) times daily for 10 days. 20 tablet Mickie Bail, NP      PDMP not reviewed this encounter.   Mickie Bail, NP 10/06/22 [phone]

## 2022-10-06 NOTE — ED Notes (Signed)
Patient is being discharged from the Urgent Care and sent to the Emergency Department via private vehicle . Per Wendee Beavers, patient is in need of higher level of care due to syncopal episode of elevated BP. Patient is aware and verbalizes understanding of plan of care.  Vitals:   10/06/22 1146 10/06/22 1153  BP: (!) 140/100 (!) 160/101  Pulse:    Resp:    Temp:    SpO2:

## 2022-10-06 NOTE — Discharge Instructions (Addendum)
Go to the emergency department for evaluation of your syncopal episode on 10/02/2022.    Your blood pressure is elevated today at 140/100; repeat 160/101.       Take the amoxicillin as directed for sinus infection. Follow up with your primary care provider tomorrow.

## 2022-10-06 NOTE — Assessment & Plan Note (Signed)
Patient stated that this is about the third time that she has passed out this year.  I do believe that we should workup this issue.  We will discuss this further at her next appointment.

## 2022-10-06 NOTE — Progress Notes (Signed)
Virtual Visit via Video Note  I connected with Kristin Orozco on 10/06/22 at  2:30 PM EDT by a video enabled telemedicine application and verified that I am speaking with the correct person using two identifiers.  Patient Location: Home Provider Location: Office/Clinic  I discussed the limitations, risks, security, and privacy concerns of performing an evaluation and management service by video and the availability of in person appointments. I also discussed with the patient that there may be a patient responsible charge related to this service. The patient expressed understanding and agreed to proceed.    Subjective   Patient ID: Kristin Orozco, female    DOB: 16-Jan-1996  Age: 27 y.o. MRN: [id number]  Chief Complaint  Patient presents with   Headache   Head Injury    Headache   Head Injury  Associated symptoms include headaches.   Kristin Orozco is a 27 y.o. female presenting today for headache and brain fog after hitting her head.  She got up in the middle of the night to use the bathroom, and when sitting on the toilet lost consciousness and fell face forward to the floor.  She does not know how long she was there.  Since then, she has developed headache that waxes and wanes throughout the day as well as brain fog and feeling "out of it".  She was seen in person at urgent care earlier today and diagnosed with acute sinusitis for which she was prescribed amoxicillin.  She has had concussions in the past and wanted to make sure that she does not need any head imaging.   Denies nausea, vomiting, balance problems, dizziness, lightheadedness, trouble falling asleep, sleeping more or less than usual, drowsiness, sensitivity to noise, irritability, sadness, anxiousness, numbness or tingling, difficulty concentrating, difficulty remembering, visual problems. Endorses frontal headache relieved with ibuprofen and Tylenol, brain fog, fatigue, light sensitivity.  Review of Systems   Neurological:  Positive for headaches.   Negative unless otherwise noted in HPI  Outpatient Medications Prior to Visit  Medication Sig   amoxicillin (AMOXIL) 875 MG tablet Take 1 tablet (875 mg total) by mouth 2 (two) times daily for 10 days.   amphetamine-dextroamphetamine (ADDERALL XR) 10 MG 24 hr capsule Take 1 capsule (10 mg total) by mouth daily.   FLUoxetine (PROZAC) 10 MG tablet Take 1 tablet (10 mg total) by mouth daily. (Patient taking differently: Take 15 mg by mouth daily.)   Multiple Vitamin (MULTIVITAMIN) LIQD Take 5 mLs by mouth daily.   No facility-administered medications prior to visit.     Objective:     Physical Exam General: Speaking clearly in complete sentences without any shortness of breath.  Alert and oriented x3.  Normal judgment. No apparent acute distress. Neuro: Alert and oriented x 3, mood/speech/behavior within normal limits, cognition at baseline.  GCS score 15.  Physical Exam from Urgent Care Visit earlier today (10/06/22) Updated Vital Signs BP (!) 160/101   Pulse 94   Temp 97.9 F (36.6 C)   Resp 18   SpO2 99%    Constitutional:      General: She is not in acute distress.    Appearance: Normal appearance. She is not ill-appearing.  HENT:     Right Ear: Tympanic membrane normal.     Left Ear: Tympanic membrane normal.     Nose: Congestion present.     Mouth/Throat:     Mouth: Mucous membranes are moist.     Pharynx: Oropharynx is clear.  Cardiovascular:  Rate and Rhythm: Normal rate and regular rhythm.     Heart sounds: Normal heart sounds.  Pulmonary:     Effort: Pulmonary effort is normal. No respiratory distress.     Breath sounds: Normal breath sounds.  Neurological:     General: No focal deficit present.     Mental Status: She is alert and oriented to person, place, and time.     Sensory: No sensory deficit.     Motor: No weakness.     Gait: Gait normal.  Psychiatric:        Mood and Affect: Mood normal.        Behavior:  Behavior normal.    Assessment & Plan:  Injury of head, initial encounter -     CT HEAD WO CONTRAST ( ); Future  Acute post-traumatic headache, not intractable -     CT HEAD WO CONTRAST ( ); Future  Elevated BP without diagnosis of hypertension Assessment & Plan: Blood pressure elevated in urgent care again today at 160/101.  Patient did note that she had some over-the-counter cough and cold medicines that may have contributed to high blood pressure.  If elevated again on next office visit, I do believe that it is time to formally diagnose hypertension.  It may be reasonable to refer her to advanced hypertension clinic or investigate secondary causes of hypertension here including renal disease.   Syncope, unspecified syncope type Assessment & Plan: Patient stated that this is about the third time that she has passed out this year.  I do believe that we should workup this issue.  We will discuss this further at her next appointment.   Patient neurologically intact, GCS score of 15, no episodes of vomiting, no amnesia.  Headache is present in addition to light sensitivity, brain fog, and fatigue.  Given the presence of headache and high blood pressures, I would like to get a head CT.  Return if symptoms worsen or fail to improve.   I discussed the assessment and treatment plan with the patient. The patient was provided an opportunity to ask questions, and all were answered. The patient agreed with the plan and demonstrated an understanding of the instructions.   The patient was advised to call back or seek an in-person evaluation if the symptoms worsen or if the condition fails to improve as anticipated.  The above assessment and management plan was discussed with the patient. The patient verbalized understanding of and has agreed to the management plan.   Kristin Quitter, PA

## 2022-10-07 ENCOUNTER — Ambulatory Visit
Admission: RE | Admit: 2022-10-07 | Discharge: 2022-10-07 | Disposition: A | Discharge status: Home / Self Care | Payer: 59 | Source: Ambulatory Visit | Attending: Family Medicine | Admitting: Family Medicine

## 2022-10-07 DIAGNOSIS — R519 Headache, unspecified: Secondary | ICD-10-CM | POA: Diagnosis not present

## 2022-10-07 DIAGNOSIS — S0990XA Unspecified injury of head, initial encounter: Secondary | ICD-10-CM | POA: Diagnosis not present

## 2022-10-07 DIAGNOSIS — G44319 Acute post-traumatic headache, not intractable: Secondary | ICD-10-CM | POA: Diagnosis not present

## 2022-10-07 DIAGNOSIS — R55 Syncope and collapse: Secondary | ICD-10-CM | POA: Diagnosis not present

## 2022-10-18 ENCOUNTER — Other Ambulatory Visit: Payer: Self-pay

## 2022-10-18 ENCOUNTER — Other Ambulatory Visit: Payer: Self-pay | Admitting: Family Medicine

## 2022-10-18 ENCOUNTER — Encounter: Payer: Self-pay | Admitting: Family Medicine

## 2022-10-18 DIAGNOSIS — F909 Attention-deficit hyperactivity disorder, unspecified type: Secondary | ICD-10-CM

## 2022-10-18 DIAGNOSIS — F902 Attention-deficit hyperactivity disorder, combined type: Secondary | ICD-10-CM

## 2022-10-18 MED ORDER — AMPHETAMINE-DEXTROAMPHET ER 15 MG PO CP24
15.0000 mg | ORAL_CAPSULE | ORAL | 0 refills | Status: DC
  Filled 2022-10-18: qty 30, 30d supply, fill #0

## 2022-10-18 NOTE — Telephone Encounter (Signed)
Change in dose

## 2022-10-19 ENCOUNTER — Other Ambulatory Visit: Payer: Self-pay

## 2022-11-16 ENCOUNTER — Other Ambulatory Visit: Payer: Self-pay

## 2022-11-16 ENCOUNTER — Other Ambulatory Visit: Payer: Self-pay | Admitting: Family Medicine

## 2022-11-16 DIAGNOSIS — F902 Attention-deficit hyperactivity disorder, combined type: Secondary | ICD-10-CM

## 2022-11-16 MED ORDER — AMPHETAMINE-DEXTROAMPHET ER 15 MG PO CP24
15.0000 mg | ORAL_CAPSULE | ORAL | 0 refills | Status: DC
  Filled 2022-11-16: qty 30, 30d supply, fill #0

## 2022-12-17 ENCOUNTER — Other Ambulatory Visit: Payer: Self-pay | Admitting: Family Medicine

## 2022-12-17 DIAGNOSIS — F902 Attention-deficit hyperactivity disorder, combined type: Secondary | ICD-10-CM

## 2022-12-20 ENCOUNTER — Other Ambulatory Visit: Payer: Self-pay | Admitting: Family Medicine

## 2022-12-20 ENCOUNTER — Other Ambulatory Visit: Payer: Self-pay

## 2022-12-20 DIAGNOSIS — F902 Attention-deficit hyperactivity disorder, combined type: Secondary | ICD-10-CM

## 2022-12-20 MED FILL — Amphetamine-Dextroamphetamine Cap ER 24HR 15 MG: ORAL | 30 days supply | Qty: 30 | Fill #0 | Status: AC

## 2023-01-01 ENCOUNTER — Other Ambulatory Visit: Payer: Self-pay | Admitting: Nurse Practitioner

## 2023-01-01 DIAGNOSIS — F411 Generalized anxiety disorder: Secondary | ICD-10-CM

## 2023-01-03 ENCOUNTER — Other Ambulatory Visit: Payer: Self-pay

## 2023-01-03 MED ORDER — FLUOXETINE HCL 10 MG PO TABS
10.0000 mg | ORAL_TABLET | Freq: Every day | ORAL | 0 refills | Status: DC
Start: 2023-01-03 — End: 2023-03-15
  Filled 2023-01-03: qty 90, 90d supply, fill #0

## 2023-01-28 ENCOUNTER — Other Ambulatory Visit: Payer: Self-pay | Admitting: Family Medicine

## 2023-01-28 DIAGNOSIS — F902 Attention-deficit hyperactivity disorder, combined type: Secondary | ICD-10-CM

## 2023-01-30 ENCOUNTER — Other Ambulatory Visit: Payer: Self-pay

## 2023-01-30 ENCOUNTER — Other Ambulatory Visit: Payer: Self-pay | Admitting: Family Medicine

## 2023-01-30 DIAGNOSIS — F902 Attention-deficit hyperactivity disorder, combined type: Secondary | ICD-10-CM

## 2023-01-31 ENCOUNTER — Other Ambulatory Visit: Payer: Self-pay

## 2023-01-31 MED FILL — Amphetamine-Dextroamphetamine Cap ER 24HR 15 MG: ORAL | 30 days supply | Qty: 30 | Fill #0 | Status: AC

## 2023-01-31 NOTE — Telephone Encounter (Signed)
Due to scheduling limitations of PCP, follow-up appointment scheduled for 03/15/2023.  Providing refill to get through upcoming appointment.

## 2023-02-10 ENCOUNTER — Encounter: Payer: 59 | Admitting: Family Medicine

## 2023-03-03 ENCOUNTER — Other Ambulatory Visit: Payer: Self-pay

## 2023-03-03 ENCOUNTER — Other Ambulatory Visit: Payer: Self-pay | Admitting: Family Medicine

## 2023-03-03 DIAGNOSIS — F902 Attention-deficit hyperactivity disorder, combined type: Secondary | ICD-10-CM

## 2023-03-03 MED ORDER — AMPHETAMINE-DEXTROAMPHET ER 15 MG PO CP24
15.0000 mg | ORAL_CAPSULE | ORAL | 0 refills | Status: DC
Start: 2023-03-03 — End: 2023-03-15
  Filled 2023-03-03: qty 30, 30d supply, fill #0

## 2023-03-15 ENCOUNTER — Ambulatory Visit (INDEPENDENT_AMBULATORY_CARE_PROVIDER_SITE_OTHER): Payer: 59 | Admitting: Family Medicine

## 2023-03-15 ENCOUNTER — Other Ambulatory Visit: Payer: Self-pay

## 2023-03-15 ENCOUNTER — Encounter: Payer: Self-pay | Admitting: Family Medicine

## 2023-03-15 VITALS — BP 131/78 | HR 95 | Resp 20 | Ht 63.0 in | Wt 146.0 lb

## 2023-03-15 DIAGNOSIS — Z0000 Encounter for general adult medical examination without abnormal findings: Secondary | ICD-10-CM | POA: Diagnosis not present

## 2023-03-15 DIAGNOSIS — R55 Syncope and collapse: Secondary | ICD-10-CM

## 2023-03-15 DIAGNOSIS — Z23 Encounter for immunization: Secondary | ICD-10-CM

## 2023-03-15 DIAGNOSIS — Z131 Encounter for screening for diabetes mellitus: Secondary | ICD-10-CM

## 2023-03-15 DIAGNOSIS — Z789 Other specified health status: Secondary | ICD-10-CM

## 2023-03-15 DIAGNOSIS — F339 Major depressive disorder, recurrent, unspecified: Secondary | ICD-10-CM | POA: Diagnosis not present

## 2023-03-15 DIAGNOSIS — F902 Attention-deficit hyperactivity disorder, combined type: Secondary | ICD-10-CM

## 2023-03-15 DIAGNOSIS — E7800 Pure hypercholesterolemia, unspecified: Secondary | ICD-10-CM | POA: Diagnosis not present

## 2023-03-15 DIAGNOSIS — Z1159 Encounter for screening for other viral diseases: Secondary | ICD-10-CM

## 2023-03-15 DIAGNOSIS — F411 Generalized anxiety disorder: Secondary | ICD-10-CM

## 2023-03-15 DIAGNOSIS — J302 Other seasonal allergic rhinitis: Secondary | ICD-10-CM

## 2023-03-15 DIAGNOSIS — R030 Elevated blood-pressure reading, without diagnosis of hypertension: Secondary | ICD-10-CM

## 2023-03-15 MED ORDER — FLUOXETINE HCL 10 MG PO TABS
10.0000 mg | ORAL_TABLET | Freq: Every day | ORAL | 1 refills | Status: DC
  Filled 2023-03-15: qty 90, 90d supply, fill #0

## 2023-03-15 MED ORDER — AMPHETAMINE-DEXTROAMPHET ER 15 MG PO CP24
15.0000 mg | ORAL_CAPSULE | ORAL | 0 refills | Status: DC
  Filled 2023-06-03: qty 30, 30d supply, fill #0

## 2023-03-15 MED ORDER — AMPHETAMINE-DEXTROAMPHET ER 15 MG PO CP24
15.0000 mg | ORAL_CAPSULE | ORAL | 0 refills | Status: DC
  Filled 2023-03-31: qty 30, 30d supply, fill #0

## 2023-03-15 NOTE — Patient Instructions (Signed)
It is always lovely to see you!  Keep up the good work, we will continue to keep an eye on your blood pressure but I am glad to hear that it is fairly normal at home :)

## 2023-03-15 NOTE — Assessment & Plan Note (Signed)
GAD-7 score of 17.  Continue Prozac 10 mg daily.  Does not want to increase to 20 mg daily as previously it made her a "zombie".

## 2023-03-15 NOTE — Assessment & Plan Note (Signed)
No further episodes of syncope since 10/06/2022.  Will continue to monitor.

## 2023-03-15 NOTE — Progress Notes (Signed)
Complete physical exam  Patient: Kristin Orozco   DOB: 11-08-1995   27 y.o. Female  MRN: [id number]  Subjective:    Chief Complaint  Patient presents with   Annual Exam    fasting    Kristin Orozco is a 27 y.o. female who presents today for a complete physical exam. She reports consuming a general diet.  She generally feels fairly well.  Work has been stressful and she noticed that she typically has higher blood pressure at work, but at home it is much lower.  She states that she has been dealing with the stress well for the most part and would like to stay at her current dose of Prozac at 10 mg.  Increasing to 15 mg and cutting the tablets in half resulted in a horrible taste and aftertaste and she does not want to move up to 20 mg daily.  She is also requesting allergy testing as she has ongoing allergies but has never been formally tested to find out what those allergies are to each season.  She has not had any further episodes of syncope since April.  Most recent fall risk assessment:    03/15/2023    8:20 AM  Fall Risk   Falls in the past year? 1  Number falls in past yr: 0  Injury with Fall? 0  Risk for fall due to : History of fall(s)  Follow up Falls evaluation completed     Most recent depression and anxiety screenings:    03/15/2023    8:21 AM 08/12/2022    3:24 PM  PHQ 2/9 Scores  PHQ - 2 Score 1 2  PHQ- 9 Score 11 16      03/15/2023    8:21 AM 08/12/2022    3:24 PM 05/12/2022    4:24 PM 11/03/2021   10:51 AM  GAD 7 : Generalized Anxiety Score  Nervous, Anxious, on Edge 3 2 3 1   Control/stop worrying 3 3 3 2   Worry too much - different things 3 3 1 2   Trouble relaxing 2 1 1 2   Restless 1 1 1 2   Easily annoyed or irritable 2 1 1 2   Afraid - awful might happen 3 1  2   Total GAD 7 Score 17 12  13   Anxiety Difficulty Somewhat difficult Somewhat difficult Somewhat difficult Very difficult    Patient Active Problem List   Diagnosis Date Noted   PVCs  (premature ventricular contractions) 05/14/2021   Atopic dermatitis 10/19/2020   Depression, recurrent (HCC) 07/23/2019   Attention deficit hyperactivity disorder (ADHD), combined type 07/23/2019   GAD (generalized anxiety disorder) 12/14/2018   Lymphadenopathy 11/30/2018   Elevated BP without diagnosis of hypertension 11/30/2018   Thyroid nodule 07/06/2018   Syncope 12/14/2013    History reviewed. No pertinent surgical history. Social History   Tobacco Use   Smoking status: Former    Current packs/day: 0.00    Types: Cigarettes    Quit date: 05/20/2018    Years since quitting: 4.8    Passive exposure: Past   Smokeless tobacco: Former    Quit date: 05/22/2019   Tobacco comments:    stopped in Nov 2019  Vaping Use   Vaping status: Some Days  Substance Use Topics   Alcohol use: Yes    Alcohol/week: 3.0 standard drinks of alcohol    Types: 3 Glasses of wine per week    Comment: rarely   Drug use: No   Family History  Problem  Complete physical exam  Patient: Kristin Orozco   DOB: 11-08-1995   27 y.o. Female  MRN: [id number]  Subjective:    Chief Complaint  Patient presents with   Annual Exam    fasting    Kristin Orozco is a 27 y.o. female who presents today for a complete physical exam. She reports consuming a general diet.  She generally feels fairly well.  Work has been stressful and she noticed that she typically has higher blood pressure at work, but at home it is much lower.  She states that she has been dealing with the stress well for the most part and would like to stay at her current dose of Prozac at 10 mg.  Increasing to 15 mg and cutting the tablets in half resulted in a horrible taste and aftertaste and she does not want to move up to 20 mg daily.  She is also requesting allergy testing as she has ongoing allergies but has never been formally tested to find out what those allergies are to each season.  She has not had any further episodes of syncope since April.  Most recent fall risk assessment:    03/15/2023    8:20 AM  Fall Risk   Falls in the past year? 1  Number falls in past yr: 0  Injury with Fall? 0  Risk for fall due to : History of fall(s)  Follow up Falls evaluation completed     Most recent depression and anxiety screenings:    03/15/2023    8:21 AM 08/12/2022    3:24 PM  PHQ 2/9 Scores  PHQ - 2 Score 1 2  PHQ- 9 Score 11 16      03/15/2023    8:21 AM 08/12/2022    3:24 PM 05/12/2022    4:24 PM 11/03/2021   10:51 AM  GAD 7 : Generalized Anxiety Score  Nervous, Anxious, on Edge 3 2 3 1   Control/stop worrying 3 3 3 2   Worry too much - different things 3 3 1 2   Trouble relaxing 2 1 1 2   Restless 1 1 1 2   Easily annoyed or irritable 2 1 1 2   Afraid - awful might happen 3 1  2   Total GAD 7 Score 17 12  13   Anxiety Difficulty Somewhat difficult Somewhat difficult Somewhat difficult Very difficult    Patient Active Problem List   Diagnosis Date Noted   PVCs  (premature ventricular contractions) 05/14/2021   Atopic dermatitis 10/19/2020   Depression, recurrent (HCC) 07/23/2019   Attention deficit hyperactivity disorder (ADHD), combined type 07/23/2019   GAD (generalized anxiety disorder) 12/14/2018   Lymphadenopathy 11/30/2018   Elevated BP without diagnosis of hypertension 11/30/2018   Thyroid nodule 07/06/2018   Syncope 12/14/2013    History reviewed. No pertinent surgical history. Social History   Tobacco Use   Smoking status: Former    Current packs/day: 0.00    Types: Cigarettes    Quit date: 05/20/2018    Years since quitting: 4.8    Passive exposure: Past   Smokeless tobacco: Former    Quit date: 05/22/2019   Tobacco comments:    stopped in Nov 2019  Vaping Use   Vaping status: Some Days  Substance Use Topics   Alcohol use: Yes    Alcohol/week: 3.0 standard drinks of alcohol    Types: 3 Glasses of wine per week    Comment: rarely   Drug use: No   Family History  Problem  Effort: Pulmonary effort is normal. No respiratory distress.     Breath sounds: Normal breath sounds. No wheezing, rhonchi or rales.  Abdominal:     General: Abdomen is flat. Bowel sounds are normal. There is no distension.     Palpations: There is no mass.     Tenderness: There is no abdominal tenderness. There is no guarding.  Musculoskeletal:        General: Normal range of motion.     Cervical back: Normal range of motion and neck supple.  Lymphadenopathy:     Cervical: No cervical adenopathy.  Skin:    General: Skin is warm and dry.  Neurological:     Mental Status: She is alert and oriented to person, place, and time.     Cranial Nerves: No cranial nerve deficit.     Motor: No weakness.     Deep Tendon Reflexes: Reflexes normal.  Psychiatric:        Mood and Affect: Mood normal.       Assessment & Plan:    Routine Health Maintenance and Physical Exam  Immunization History  Administered Date(s) Administered   DTaP 04/25/1996, 07/13/1996, 08/24/1996, 08/23/1997, 06/06/2000   HIB (PRP-OMP) 04/25/1996, 07/13/1996, 08/24/1996, 08/23/1997   Hepatitis A 03/06/2008, 08/03/2012   Hepatitis A, Ped/Adol-2 Dose 09/17/2008   Hepatitis B 1995[birth year], 04/25/1996, 08/24/1996   IPV 04/25/1996, 07/13/1996, 02/14/1997, 06/06/2000   Influenza-Unspecified 03/07/2018, 04/06/2019, 04/11/2020, 03/30/2021, 03/31/2022   MMR 02/14/1997, 06/06/2000   Meningococcal Conjugate 08/03/2012   PFIZER(Purple Top)SARS-COV-2 Vaccination 03/07/2020, 03/28/2020   Tdap 01/31/2007, 03/15/2023   Varicella 02/14/1997, 08/03/2012    Health Maintenance  Topic Date Due   HIV Screening  Never done   Hepatitis C Screening  Never done   COVID-19 Vaccine (3 - 2023-24 season) 02/27/2023    INFLUENZA VACCINE  09/26/2023 (Originally 01/27/2023)   Cervical Cancer Screening (Pap smear)  08/24/2024   DTaP/Tdap/Td (8 - Td or Tdap) 03/14/2033   HPV VACCINES  Aged Out    Repeating labs including CBC, CMP, lipid panel, A1C, TSH, and vitamin D.  Also updating HIV and hepatitis C screening. She will get her flu shot through her job at [employer] next month. Agreeable to updating Tdap in office today.  Discussed health benefits of physical activity, and encouraged her to engage in regular exercise appropriate for her age and condition.  Wellness examination  Attention deficit hyperactivity disorder (ADHD), combined type Assessment & Plan: Stable. Continue Adderall XR 10 mg daily.  Will continue to monitor blood pressure.  Orders: -     CBC with Differential/Platelet; Future -     Comprehensive metabolic panel; Future -     Amphetamine-Dextroamphet ER; Take 1 capsule by mouth every morning.  Dispense: 30 capsule; Refill: 0 -     Amphetamine-Dextroamphet ER; Take 1 capsule by mouth every morning.  Dispense: 30 capsule; Refill: 0  Depression, recurrent (HCC) Assessment & Plan: PHQ-9 score improved to 11.  Continue Prozac 10 mg daily.  We also discussed that is an option to increase to 20 mg daily in the future if she finds that the stress is too much to cope with.  Patient verbalized understanding.  Orders: -     CBC with Differential/Platelet; Future -     Comprehensive metabolic panel; Future  GAD (generalized anxiety disorder) Assessment & Plan: GAD-7 score of 17.  Continue Prozac 10 mg daily.  Does not want to increase to 20 mg daily as previously it made her a "zombie".  Orders: -  Complete physical exam  Patient: Kristin Orozco   DOB: 11-08-1995   27 y.o. Female  MRN: [id number]  Subjective:    Chief Complaint  Patient presents with   Annual Exam    fasting    Kristin Orozco is a 27 y.o. female who presents today for a complete physical exam. She reports consuming a general diet.  She generally feels fairly well.  Work has been stressful and she noticed that she typically has higher blood pressure at work, but at home it is much lower.  She states that she has been dealing with the stress well for the most part and would like to stay at her current dose of Prozac at 10 mg.  Increasing to 15 mg and cutting the tablets in half resulted in a horrible taste and aftertaste and she does not want to move up to 20 mg daily.  She is also requesting allergy testing as she has ongoing allergies but has never been formally tested to find out what those allergies are to each season.  She has not had any further episodes of syncope since April.  Most recent fall risk assessment:    03/15/2023    8:20 AM  Fall Risk   Falls in the past year? 1  Number falls in past yr: 0  Injury with Fall? 0  Risk for fall due to : History of fall(s)  Follow up Falls evaluation completed     Most recent depression and anxiety screenings:    03/15/2023    8:21 AM 08/12/2022    3:24 PM  PHQ 2/9 Scores  PHQ - 2 Score 1 2  PHQ- 9 Score 11 16      03/15/2023    8:21 AM 08/12/2022    3:24 PM 05/12/2022    4:24 PM 11/03/2021   10:51 AM  GAD 7 : Generalized Anxiety Score  Nervous, Anxious, on Edge 3 2 3 1   Control/stop worrying 3 3 3 2   Worry too much - different things 3 3 1 2   Trouble relaxing 2 1 1 2   Restless 1 1 1 2   Easily annoyed or irritable 2 1 1 2   Afraid - awful might happen 3 1  2   Total GAD 7 Score 17 12  13   Anxiety Difficulty Somewhat difficult Somewhat difficult Somewhat difficult Very difficult    Patient Active Problem List   Diagnosis Date Noted   PVCs  (premature ventricular contractions) 05/14/2021   Atopic dermatitis 10/19/2020   Depression, recurrent (HCC) 07/23/2019   Attention deficit hyperactivity disorder (ADHD), combined type 07/23/2019   GAD (generalized anxiety disorder) 12/14/2018   Lymphadenopathy 11/30/2018   Elevated BP without diagnosis of hypertension 11/30/2018   Thyroid nodule 07/06/2018   Syncope 12/14/2013    History reviewed. No pertinent surgical history. Social History   Tobacco Use   Smoking status: Former    Current packs/day: 0.00    Types: Cigarettes    Quit date: 05/20/2018    Years since quitting: 4.8    Passive exposure: Past   Smokeless tobacco: Former    Quit date: 05/22/2019   Tobacco comments:    stopped in Nov 2019  Vaping Use   Vaping status: Some Days  Substance Use Topics   Alcohol use: Yes    Alcohol/week: 3.0 standard drinks of alcohol    Types: 3 Glasses of wine per week    Comment: rarely   Drug use: No   Family History  Problem

## 2023-03-15 NOTE — Assessment & Plan Note (Signed)
PHQ-9 score improved to 11.  Continue Prozac 10 mg daily.  We also discussed that is an option to increase to 20 mg daily in the future if she finds that the stress is too much to cope with.  Patient verbalized understanding.

## 2023-03-15 NOTE — Assessment & Plan Note (Addendum)
She checks her blood pressure periodically both at work and at home.  Blood pressure elevated at work and in the clinic, but at home is within normal range.  Continue ambulatory blood pressure monitoring.  Will continue to monitor.

## 2023-03-15 NOTE — Assessment & Plan Note (Signed)
Stable. Continue Adderall XR 10 mg daily.  Will continue to monitor blood pressure.

## 2023-03-21 ENCOUNTER — Encounter: Payer: Self-pay | Admitting: Family Medicine

## 2023-03-21 ENCOUNTER — Other Ambulatory Visit: Payer: Self-pay

## 2023-03-21 DIAGNOSIS — B3731 Acute candidiasis of vulva and vagina: Secondary | ICD-10-CM

## 2023-03-21 MED ORDER — FLUCONAZOLE 150 MG PO TABS
150.0000 mg | ORAL_TABLET | Freq: Once | ORAL | 0 refills | Status: AC
Start: 2023-03-21 — End: 2023-03-22
  Filled 2023-03-21: qty 1, 1d supply, fill #0

## 2023-03-21 NOTE — Addendum Note (Signed)
Addended by: Saralyn Pilar on: 03/21/2023 11:10 AM   Modules accepted: Orders

## 2023-04-01 ENCOUNTER — Other Ambulatory Visit: Payer: Self-pay

## 2023-04-21 ENCOUNTER — Other Ambulatory Visit: Payer: Self-pay

## 2023-04-21 ENCOUNTER — Other Ambulatory Visit: Payer: Self-pay | Admitting: Family Medicine

## 2023-04-21 DIAGNOSIS — F902 Attention-deficit hyperactivity disorder, combined type: Secondary | ICD-10-CM

## 2023-04-21 MED ORDER — AMPHETAMINE-DEXTROAMPHET ER 15 MG PO CP24
15.0000 mg | ORAL_CAPSULE | ORAL | 0 refills | Status: DC
  Filled 2023-04-21 – 2023-04-29 (×2): qty 30, 30d supply, fill #0

## 2023-04-25 ENCOUNTER — Other Ambulatory Visit: Payer: Self-pay

## 2023-04-29 ENCOUNTER — Other Ambulatory Visit: Payer: Self-pay

## 2023-05-10 ENCOUNTER — Other Ambulatory Visit: Payer: Self-pay

## 2023-05-10 ENCOUNTER — Encounter: Payer: Self-pay | Admitting: Internal Medicine

## 2023-05-10 ENCOUNTER — Ambulatory Visit: Payer: 59 | Admitting: Internal Medicine

## 2023-05-10 VITALS — BP 124/88 | HR 98 | Temp 98.6°F | Resp 18 | Ht 62.21 in | Wt 150.5 lb

## 2023-05-10 DIAGNOSIS — J3089 Other allergic rhinitis: Secondary | ICD-10-CM | POA: Diagnosis not present

## 2023-05-10 DIAGNOSIS — L858 Other specified epidermal thickening: Secondary | ICD-10-CM | POA: Diagnosis not present

## 2023-05-10 DIAGNOSIS — J329 Chronic sinusitis, unspecified: Secondary | ICD-10-CM | POA: Diagnosis not present

## 2023-05-10 MED ORDER — LEVOCETIRIZINE DIHYDROCHLORIDE 5 MG PO TABS
5.0000 mg | ORAL_TABLET | Freq: Every evening | ORAL | 5 refills | Status: DC
  Filled 2023-05-10: qty 30, 30d supply, fill #0
  Filled 2023-06-10 – 2023-06-15 (×2): qty 30, 30d supply, fill #1
  Filled 2023-07-05: qty 90, 90d supply, fill #2
  Filled 2023-10-19: qty 30, 30d supply, fill #3

## 2023-05-10 MED ORDER — FLUTICASONE PROPIONATE 50 MCG/ACT NA SUSP
2.0000 | Freq: Every day | NASAL | 5 refills | Status: DC
  Filled 2023-05-10: qty 16, 30d supply, fill #0

## 2023-05-10 NOTE — Progress Notes (Signed)
NEW PATIENT  Date of Service/Encounter:  05/10/23  Consult requested by: [name], PA   Subjective:   Kristin Orozco (DOB: [birth year]) is a 27 y.o. female who presents to the clinic on 05/10/2023 with a chief complaint of Establish Care .    History obtained from: chart review and patient.   Rhinitis:  Started around age 15.  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    Symptoms include: nasal congestion, rhinorrhea, post nasal drainage, and watery eyes, ear fullness and popping   Occurs seasonally-Spring/Fall  Potential triggers: pollen  Treatments tried:  Chloropheniramine PRN  Claritin/Zyrtec did not help in the past  Flonase PRN  Previous allergy testing: no History of sinus surgery: none  Nonallergic triggers: none    Rashes:  Reports having dry skin. Also gets itchy white bumps on the back of her arms intermittently.  She wonders if this is a vitamin issue.    Sinus Infections: Has trouble with frequent upper respiratory infections.  Has received 4 courses of abx in the last year.  Has a hx of abscess after wisdom tooth removal.  Denies any frequent skin, GI infections. No history of bone/joint/brain infections. Never required IV antibiotics or hospitalization.   Past Medical History: Past Medical History:  Diagnosis Date   Anxiety    Syncope    Tachycardia    Thyroid disease    Hx of thyroid nodule--followed by PCP    Past Surgical History: History reviewed. No pertinent surgical history.  Family History: Family History  Problem Relation Age of Onset   Hypertension Mother    ADD / ADHD Mother    Anxiety disorder Mother    Depression Mother    Miscarriages / [place] Mother    COPD Father    Asthma Maternal Aunt    Depression Maternal Aunt    Lupus Maternal Uncle    Multiple sclerosis Maternal Grandmother    Cancer Maternal Grandmother        breast and bone   Diabetes Maternal Grandmother    Breast cancer  Maternal Grandmother 63       mets to bone   Thyroid disease Maternal Grandmother        hypothyroid   Depression Maternal Grandmother    Obesity Maternal Grandmother    Heart attack Maternal Grandfather    Heart disease Maternal Grandfather    Hypertension Maternal Grandfather     Social History:  Flooring in bedroom: carpet Pets: none Tobacco use/exposure: prior hx; 2013-2019, 1/2 ppd  Job: patient account rep   Medication List:  Allergies as of 05/10/2023   No Known Allergies      Medication List        Accurate as of May 10, 2023  1:51 PM. If you have any questions, ask your nurse or doctor.          amphetamine-dextroamphetamine 15 MG 24 hr capsule Commonly known as: ADDERALL XR Take 1 capsule by mouth every morning.   amphetamine-dextroamphetamine 15 MG 24 hr capsule Commonly known as: ADDERALL XR Take 1 capsule by mouth every morning.   FLUoxetine 10 MG tablet Commonly known as: PROZAC Take 1 tablet (10 mg total) by mouth daily.         REVIEW OF SYSTEMS: Pertinent positives and negatives discussed in HPI.   Objective:   Physical Exam: BP 124/88 (BP Location: Left Arm, Patient Position: Sitting, Cuff Size: Normal)   Pulse 98   Temp  98.6 F (37 C) (Temporal)   Resp 18   Ht 5' 2.21" (1.58 m)   Wt 150 lb 8 oz (68.3 kg)   SpO2 100%   BMI 27.35 kg/m  Body mass index is 27.35 kg/m. GEN: alert, well developed HEENT: clear conjunctiva, TM grey and translucent, nose with + mild inferior turbinate hypertrophy, pink nasal mucosa, slight clear rhinorrhea, no cobblestoning HEART: regular rate and rhythm, no murmur LUNGS: clear to auscultation bilaterally, no coughing, unlabored respiration ABDOMEN: soft, non distended  SKIN: no rashes or lesions  Reviewed:  03/15/2023: seen by Jairo Ben PA for allergic rhinitis with frequent symptoms, referred to Allergy. Previously on Flonase/Zyrtec.  10/06/2022: seen in urgent care for ear pain, congestion,  sinus pressure, cough.  Started on amoxil for sinus infection.  07/16/2021: seen by Cardiology for palpitations/chest pain. Had zio patch with 1.7% PVCs, monomorphic. Discussed avoidance of triggers and attempt vagal maneuvers.   Assessment:   1. Other allergic rhinitis   2. Keratosis pilaris   3. Recurrent sinus infections     Plan/Recommendations:  Other Allergic Rhinitis: - Due to turbinate hypertrophy, seasonal symptoms, recurrent sinus infection and unresponsive to over the counter meds, will perform skin testing to identify aeroallergen triggers.   - Use nasal saline rinses before nose sprays such as with Neilmed Sinus Rinse.  Use distilled water.   - Use Flonase 2 sprays each nostril daily. Aim upward and outward. - Use Xyzal 5 mg daily. Avoid first generation anti histamines unless for short term use. Hold all anti histamines (Xyzal, Zyrtec, Claritin, Allegra, benadryl, Chlorpheniramine)  3 days prior to next visit for skin testing.   Recurrent Sinus Infection - Likely related to inflammation.  No significant hx of other infections.  - Can consider immune evaluation if no improvement with treatment of allergic rhinitis.  Dry Skin - Do a daily soaking tub bath in warm water for 10-15 minutes.  - Use a gentle, unscented cleanser at the end of the bath (such as Dove unscented bar or baby wash, or Aveeno sensitive body wash). Then rinse, pat half-way dry, and apply a gentle, unscented moisturizer cream or ointment (Cerave, Cetaphil, Eucerin, Aveeno)  all over while still damp. Dry skin makes the itching and rash worse. The skin should be moisturized with a gentle, unscented moisturizer at least twice daily.  - Use only unscented liquid laundry detergent.  Keratosis Pilaris  - Exfoliate gently. When you exfoliate your skin, you remove the dead skin cells from the surface. You can slough off these dead cells gently with a loofah, buff puff, or rough washcloth. Avoid scrubbing your skin,  which tends to irritate the skin and worsen keratosis pilaris. - Apply Amlactin cream or Cerave-SA cream.  - Slather on moisturizer- cream or ointment.  You want to apply the moisturizer: after bathing and when your skin feels dry, and at least 2 or 3 times a day   Follow up: 11/19 at 3:45 PM for skin testing 1-55. No IDs.    No follow-ups on file.  Alesia Morin, MD [facility]

## 2023-05-10 NOTE — Patient Instructions (Addendum)
Other Allergic Rhinitis: - Due to turbinate hypertrophy, seasonal symptoms, recurrent sinus infection and unresponsive to over the counter meds, will perform skin testing to identify aeroallergen triggers.   - Use nasal saline rinses before nose sprays such as with Neilmed Sinus Rinse.  Use distilled water.   - Use Flonase 2 sprays each nostril daily. Aim upward and outward. - Use Xyzal 5 mg daily. Avoid first generation anti histamines unless for short term use. Hold all anti histamines (Xyzal, Zyrtec, Claritin, Allegra, benadryl, Chlorpheniramine)  3 days prior to next visit for skin testing.   Recurrent Sinus Infection - Likely related to inflammation.    Dry Skin - Do a daily soaking tub bath in warm water for 10-15 minutes.  - Use a gentle, unscented cleanser at the end of the bath (such as Dove unscented bar or baby wash, or Aveeno sensitive body wash). Then rinse, pat half-way dry, and apply a gentle, unscented moisturizer cream or ointment (Cerave, Cetaphil, Eucerin, Aveeno)  all over while still damp. Dry skin makes the itching and rash worse. The skin should be moisturized with a gentle, unscented moisturizer at least twice daily.  - Use only unscented liquid laundry detergent.  Keratosis Pilaris  - Exfoliate gently. When you exfoliate your skin, you remove the dead skin cells from the surface. You can slough off these dead cells gently with a loofah, buff puff, or rough washcloth. Avoid scrubbing your skin, which tends to irritate the skin and worsen keratosis pilaris. - Apply Amlactin cream or Cerave-SA cream.  - Slather on moisturizer- cream or ointment.  You want to apply the moisturizer: after bathing and when your skin feels dry, and at least 2 or 3 times a day   Follow up: 11/19 at 3:45 PM for skin testing 1-55. No IDs.

## 2023-05-16 ENCOUNTER — Other Ambulatory Visit: Payer: Self-pay

## 2023-05-17 ENCOUNTER — Ambulatory Visit: Payer: 59 | Admitting: Internal Medicine

## 2023-06-01 ENCOUNTER — Ambulatory Visit: Payer: 59 | Admitting: Internal Medicine

## 2023-06-01 DIAGNOSIS — J3089 Other allergic rhinitis: Secondary | ICD-10-CM | POA: Diagnosis not present

## 2023-06-01 DIAGNOSIS — J3081 Allergic rhinitis due to animal (cat) (dog) hair and dander: Secondary | ICD-10-CM | POA: Diagnosis not present

## 2023-06-01 DIAGNOSIS — J301 Allergic rhinitis due to pollen: Secondary | ICD-10-CM | POA: Diagnosis not present

## 2023-06-01 NOTE — Progress Notes (Signed)
FOLLOW UP Date of Service/Encounter:  06/01/23   Subjective:  Kristin Orozco (DOB: 1995/10/14) is a 27 y.o. female who [facility] for follow up for skin testing.   History obtained from: chart review and patient.  Anti histamines held.   Past Medical History: Past Medical History:  Diagnosis Date   Anxiety    Syncope    Tachycardia    Thyroid disease    Hx of thyroid nodule--followed by PCP    Objective:  There were no vitals taken for this visit. There is no height or weight on file to calculate BMI. Physical Exam: GEN: alert, well developed HEENT: clear conjunctiva, MMM LUNGS: unlabored respiration   Skin Testing:  Skin prick testing was placed, which includes aeroallergens/foods, histamine control, and saline control.  Verbal consent was obtained prior to placing test.  Patient tolerated procedure well.  Allergy testing results were read and interpreted by myself, documented by clinical staff. Adequate positive and negative control.  Positive results to:  Results discussed with patient/family.  Airborne Adult Perc - 06/01/23 1437     Time Antigen Placed 1437    Allergen Manufacturer Waynette Buttery    Location Back    Number of Test 55    1. Control-Buffer 50% Glycerol Negative    2. Control-Histamine 3+    3. Bahia Negative    4. French Southern Territories Negative    5. Johnson Negative    6. Kentucky Blue Negative    7. Meadow Fescue Negative    8. Perennial Rye Negative    9. Timothy Negative    10. Ragweed Mix Negative    11. Cocklebur Negative    12. Plantain,  English Negative    13. Baccharis Negative    14. Dog Fennel Negative    15. Russian Thistle Negative    16. Lamb's Quarters Negative    17. Sheep Sorrell Negative    18. Rough Pigweed 2+    19. Marsh Elder, Rough 2+    20. Mugwort, Common Negative    21. Box, Elder 2+    22. Cedar, red Negative    23. Sweet Gum 2+    24. Pecan Pollen 2+    25. Pine Mix Negative     26. Walnut, Black Pollen Negative    27. Red Mulberry Negative    28. Ash Mix Negative    29. Birch Mix Negative    30. Beech American 3+    31. Cottonwood, [place] 2+    32. Hickory, White Negative    33. Maple Mix Negative    34. Oak, [place] Mix 2+    35. Sycamore Eastern Negative    36. Alternaria Alternata Negative    37. Cladosporium Herbarum Negative    38. Aspergillus Mix Negative    39. Penicillium Mix 2+    40. Bipolaris Sorokiniana (Helminthosporium) 2+    41. Drechslera Spicifera (Curvularia) Negative    42. Mucor Plumbeus Negative    43. Fusarium Moniliforme 2+    44. Aureobasidium Pullulans (pullulara) 2+    45. Rhizopus Oryzae Negative    46. Botrytis Cinera Negative    47. Epicoccum Nigrum Negative    48. Phoma Betae Negative    49. Dust Mite Mix 3+    50. Cat Hair 10,000 BAU/ml 2+    51.  Dog Epithelia Negative    52. Mixed Feathers Negative    53. Horse Epithelia Negative    54. Cockroach, [place]  3+    55. Tobacco Leaf Negative              Assessment:   1. Allergic rhinitis caused by mold   2. Seasonal allergic rhinitis due to pollen   3. Allergic rhinitis due to dust mite   4. Allergic rhinitis due to animal hair or dander   5. Allergic rhinitis due to insect     Plan/Recommendations:  Allergic Rhinitis: - Due to turbinate hypertrophy, seasonal symptoms, recurrent sinus infection and unresponsive to over the counter meds, will perform skin testing to identify aeroallergen triggers.   - SPT 05/2023: positive to weeds, trees, molds, dust mites, cats, cockroach  - Use nasal saline rinses before nose sprays such as with Neilmed Sinus Rinse.  Use distilled water.   - Use Flonase 2 sprays each nostril daily. Aim upward and outward. - Use Xyzal 5 mg daily.  - Consider allergy shots as long term control of your symptoms by teaching your immune system to be more tolerant of your allergy triggers  Recurrent Sinus Infection - Likely related to  inflammation.    Dry Skin - Do a daily soaking tub bath in warm water for 10-15 minutes.  - Use a gentle, unscented cleanser at the end of the bath (such as Dove unscented bar or baby wash, or Aveeno sensitive body wash). Then rinse, pat half-way dry, and apply a gentle, unscented moisturizer cream or ointment (Cerave, Cetaphil, Eucerin, Aveeno)  all over while still damp. Dry skin makes the itching and rash worse. The skin should be moisturized with a gentle, unscented moisturizer at least twice daily.  - Use only unscented liquid laundry detergent.  Keratosis Pilaris  - Exfoliate gently. When you exfoliate your skin, you remove the dead skin cells from the surface. You can slough off these dead cells gently with a loofah, buff puff, or rough washcloth. Avoid scrubbing your skin, which tends to irritate the skin and worsen keratosis pilaris. - Apply Amlactin cream or Cerave-SA cream.  - Slather on moisturizer- cream or ointment.  You want to apply the moisturizer: after bathing and when your skin feels dry, and at least 2 or 3 times a day  ALLERGEN AVOIDANCE MEASURES   Dust Mites Use central air conditioning and heat; and change the filter monthly.  Pleated filters work better than mesh filters.  Electrostatic filters may also be used; wash the filter monthly.  Window air conditioners may be used, but do not clean the air as well as a central air conditioner.  Change or wash the filter monthly. Keep windows closed.  Do not use attic fans.   Encase the mattress, box springs and pillows with zippered, dust proof covers. Wash the bed linens in hot water weekly.   Remove carpet, especially from the bedroom. Remove stuffed animals, throw pillows, dust ruffles, heavy drapes and other items that collect dust from the bedroom. Do not use a humidifier.   Use wood, vinyl or leather furniture instead of cloth furniture in the bedroom. Keep the indoor humidity at 30 - 40%.  Monitor with a humidity  gauge.  Molds - Indoor avoidance Use air conditioning to reduce indoor humidity.  Do not use a humidifier. Keep indoor humidity at 30 - 40%.  Use a dehumidifier if needed. In the bathroom use an exhaust fan or open a window after showering.  Wipe down damp surfaces after showering.  Clean bathrooms with a mold-killing solution (diluted bleach, or products like Tilex, etc) at least  once a month. In the kitchen use an exhaust fan to remove steam from cooking.  Throw away spoiled foods immediately, and empty garbage daily.  Empty water pans below self-defrosting refrigerators frequently. Vent the clothes dryer to the outside. Limit indoor houseplants; mold grows in the dirt.  No houseplants in the bedroom. Remove carpet from the bedroom. Encase the mattress and box springs with a zippered encasing.  Molds - Outdoor avoidance Avoid being outside when the grass is being mowed, or the ground is tilled. Avoid playing in leaves, pine straw, hay, etc.  Dead plant materials contain mold. Avoid going into barns or grain storage areas. Remove leaves, clippings and compost from around the home.  Cockroach Limit spread of food around the house; especially keep food out of bedrooms. Keep food and garbage in closed containers with a tight lid.  Never leave food out in the kitchen.  Do not leave out pet food or dirty food bowls. Mop the kitchen floor and wash countertops at least once a week. Repair leaky pipes and faucets so there is no standing water to attract roaches. Plug up cracks in the house through which cockroaches can enter. Use bait stations and approved pesticides to reduce cockroach infestation. Pollen Avoidance Pollen levels are highest during the mid-day and afternoon.  Consider this when planning outdoor activities. Avoid being outside when the grass is being mowed, or wear a mask if the pollen-allergic person must be the one to mow the grass. Keep the windows closed to keep pollen  outside of the home. Use an air conditioner to filter the air. Take a shower, wash hair, and change clothing after working or playing outdoors during pollen season. Pet Dander- Cats Keep the pet out of your bedroom and restrict it to only a few rooms. Be advised that keeping the pet in only one room will not limit the allergens to that room. Don't pet, hug or kiss the pet; if you do, wash your hands with soap and water. High-efficiency particulate air (HEPA) cleaners run continuously in a bedroom or living room can reduce allergen levels over time. Regular use of a high-efficiency vacuum cleaner or a central vacuum can reduce allergen levels. Giving your pet a bath at least once a week can reduce airborne allergen.    Return in about 6 weeks (around 07/13/2023).  Alesia Morin, MD [facility]

## 2023-06-01 NOTE — Patient Instructions (Addendum)
Allergic Rhinitis:  - SPT 05/2023: positive to weeds, trees, molds, dust mites, cats, cockroach  - Use nasal saline rinses before nose sprays such as with Neilmed Sinus Rinse.  Use distilled water.   - Use Flonase 2 sprays each nostril daily. Aim upward and outward. - Use Xyzal 5 mg daily.  - Consider allergy shots as long term control of your symptoms by teaching your immune system to be more tolerant of your allergy triggers  Recurrent Sinus Infection - Likely related to inflammation.    Dry Skin - Do a daily soaking tub bath in warm water for 10-15 minutes.  - Use a gentle, unscented cleanser at the end of the bath (such as Dove unscented bar or baby wash, or Aveeno sensitive body wash). Then rinse, pat half-way dry, and apply a gentle, unscented moisturizer cream or ointment (Cerave, Cetaphil, Eucerin, Aveeno)  all over while still damp. Dry skin makes the itching and rash worse. The skin should be moisturized with a gentle, unscented moisturizer at least twice daily.  - Use only unscented liquid laundry detergent.  Keratosis Pilaris  - Exfoliate gently. When you exfoliate your skin, you remove the dead skin cells from the surface. You can slough off these dead cells gently with a loofah, buff puff, or rough washcloth. Avoid scrubbing your skin, which tends to irritate the skin and worsen keratosis pilaris. - Apply Amlactin cream or Cerave-SA cream.  - Slather on moisturizer- cream or ointment.  You want to apply the moisturizer: after bathing and when your skin feels dry, and at least 2 or 3 times a day  ALLERGEN AVOIDANCE MEASURES   Dust Mites Use central air conditioning and heat; and change the filter monthly.  Pleated filters work better than mesh filters.  Electrostatic filters may also be used; wash the filter monthly.  Window air conditioners may be used, but do not clean the air as well as a central air conditioner.  Change or wash the filter monthly. Keep windows closed.  Do  not use attic fans.   Encase the mattress, box springs and pillows with zippered, dust proof covers. Wash the bed linens in hot water weekly.   Remove carpet, especially from the bedroom. Remove stuffed animals, throw pillows, dust ruffles, heavy drapes and other items that collect dust from the bedroom. Do not use a humidifier.   Use wood, vinyl or leather furniture instead of cloth furniture in the bedroom. Keep the indoor humidity at 30 - 40%.   Molds - Indoor avoidance Use air conditioning to reduce indoor humidity.  Do not use a humidifier. Keep indoor humidity at 30 - 40%.  Use a dehumidifier if needed. In the bathroom use an exhaust fan or open a window after showering.  Wipe down damp surfaces after showering.  Clean bathrooms with a mold-killing solution (diluted bleach, or products like Tilex, etc) at least once a month. In the kitchen use an exhaust fan to remove steam from cooking.  Throw away spoiled foods immediately, and empty garbage daily.  Empty water pans below self-defrosting refrigerators frequently. Vent the clothes dryer to the outside. Limit indoor houseplants; mold grows in the dirt.  No houseplants in the bedroom. Remove carpet from the bedroom. Encase the mattress and box springs with a zippered encasing.  Molds - Outdoor avoidance Avoid being outside when the grass is being mowed, or the ground is tilled. Avoid playing in leaves, pine straw, hay, etc.  Dead plant materials contain mold. Avoid going into  barns or grain storage areas. Remove leaves, clippings and compost from around the home.  Cockroach Limit spread of food around the house; especially keep food out of bedrooms. Keep food and garbage in closed containers with a tight lid.  Never leave food out in the kitchen.  Do not leave out pet food or dirty food bowls. Mop the kitchen floor and wash countertops at least once a week. Repair leaky pipes and faucets so there is no standing water to attract  roaches. Plug up cracks in the house through which cockroaches can enter. Use bait stations and approved pesticides to reduce cockroach infestation. Pollen Avoidance Pollen levels are highest during the mid-day and afternoon.  Consider this when planning outdoor activities. Avoid being outside when the grass is being mowed, or wear a mask if the pollen-allergic person must be the one to mow the grass. Keep the windows closed to keep pollen outside of the home. Use an air conditioner to filter the air. Take a shower, wash hair, and change clothing after working or playing outdoors during pollen season. Pet Dander- Cats Keep the pet out of your bedroom and restrict it to only a few rooms. Be advised that keeping the pet in only one room will not limit the allergens to that room. Don't pet, hug or kiss the pet; if you do, wash your hands with soap and water. High-efficiency particulate air (HEPA) cleaners run continuously in a bedroom or living room can reduce allergen levels over time. Regular use of a high-efficiency vacuum cleaner or a central vacuum can reduce allergen levels. Giving your pet a bath at least once a week can reduce airborne allergen.

## 2023-06-03 ENCOUNTER — Other Ambulatory Visit: Payer: Self-pay

## 2023-06-10 ENCOUNTER — Other Ambulatory Visit: Payer: Self-pay

## 2023-06-14 ENCOUNTER — Telehealth: Payer: 59 | Admitting: Family Medicine

## 2023-06-14 ENCOUNTER — Encounter: Payer: Self-pay | Admitting: Family Medicine

## 2023-06-14 ENCOUNTER — Other Ambulatory Visit: Payer: Self-pay

## 2023-06-14 DIAGNOSIS — F902 Attention-deficit hyperactivity disorder, combined type: Secondary | ICD-10-CM

## 2023-06-14 DIAGNOSIS — F339 Major depressive disorder, recurrent, unspecified: Secondary | ICD-10-CM | POA: Diagnosis not present

## 2023-06-14 DIAGNOSIS — F411 Generalized anxiety disorder: Secondary | ICD-10-CM

## 2023-06-14 MED ORDER — FLUOXETINE HCL 10 MG PO TABS
10.0000 mg | ORAL_TABLET | Freq: Every day | ORAL | 1 refills | Status: DC
  Filled 2023-06-14: qty 90, 90d supply, fill #0

## 2023-06-14 MED ORDER — AMPHETAMINE-DEXTROAMPHET ER 15 MG PO CP24
15.0000 mg | ORAL_CAPSULE | ORAL | 0 refills | Status: DC
  Filled 2023-08-03: qty 30, 30d supply, fill #0

## 2023-06-14 MED ORDER — AMPHETAMINE-DEXTROAMPHET ER 15 MG PO CP24
15.0000 mg | ORAL_CAPSULE | ORAL | 0 refills | Status: DC
  Filled 2023-06-14 – 2023-07-05 (×2): qty 30, 30d supply, fill #0

## 2023-06-14 MED ORDER — AMPHETAMINE-DEXTROAMPHET ER 15 MG PO CP24: 15.0000 mg | ORAL_CAPSULE | ORAL | 0 refills | Status: DC

## 2023-06-14 NOTE — Assessment & Plan Note (Signed)
Fairly stable.  Continue Prozac 10 mg daily.  Follow-up and adjust as needed after starting her new job.

## 2023-06-14 NOTE — Assessment & Plan Note (Addendum)
Stable. Continue Adderall XR 10 mg daily.  PDMP reviewed, no aberrancies.  Overdose risk score 280.  Three 30-day prescriptions sent to the pharmacy.  Will continue to monitor blood pressure.

## 2023-06-14 NOTE — Progress Notes (Signed)
Virtual Visit via Video Note  I connected with Kristin Orozco on 06/14/23 at  3:50 PM EST by a video enabled telemedicine application and verified that I am speaking with the correct person using two identifiers.  Patient Location: Home Provider Location: Office/clinic  I discussed the limitations, risks, security, and privacy concerns of performing an evaluation and management service by video and the availability of in person appointments. I also discussed with the patient that there may be a patient responsible charge related to this service. The patient expressed understanding and agreed to proceed.    Subjective   Patient ID: Kristin Orozco, female    DOB: 1996/05/01  Age: 27 y.o. MRN: [id number]  Chief Complaint  Patient presents with   ADHD   Anxiety    HPI [name] is a 27 y.o. female presenting today for follow up of ADHD, mood. Reports symptoms are well controlled on Adderall. Denies any problems with insomnia, chest pain, or SOB.  She has cut back on caffeine within the past few months and has noticed a significant decrease in palpitations.  She has been taking Prozac 10 mg daily and feels that is working well.  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  She feels that this will be a beneficial change for her overall but especially mental health.  ROS Negative unless otherwise noted in HPI  Outpatient Medications Prior to Visit  Medication Sig   fluticasone (FLONASE) 50 MCG/ACT nasal spray Place 2 sprays into both nostrils daily.   levocetirizine (XYZAL) 5 MG tablet Take 1 tablet (5 mg total) by mouth every evening.   [DISCONTINUED] amphetamine-dextroamphetamine (ADDERALL XR) 15 MG 24 hr capsule Take 1 capsule by mouth every morning.   [DISCONTINUED] amphetamine-dextroamphetamine (ADDERALL XR) 15 MG 24 hr capsule Take 1 capsule by mouth every morning.   [DISCONTINUED] FLUoxetine (PROZAC) 10  MG tablet Take 1 tablet (10 mg total) by mouth daily.   No facility-administered medications prior to visit.     Objective:     Physical Exam General: Speaking clearly in complete sentences without any shortness of breath.  Alert and oriented x3.  Normal judgment. No apparent acute distress.   Assessment & Plan:  Attention deficit hyperactivity disorder (ADHD), combined type Assessment & Plan: Stable. Continue Adderall XR 10 mg daily.  PDMP reviewed, no aberrancies.  Overdose risk score 280.  Three 30-day prescriptions sent to the pharmacy.  Will continue to monitor blood pressure.  Orders: -     Amphetamine-Dextroamphet ER; Take 1 capsule by mouth every morning.  Dispense: 30 capsule; Refill: 0 -     Amphetamine-Dextroamphet ER; Take 1 capsule by mouth every morning.  Dispense: 30 capsule; Refill: 0 -     Amphetamine-Dextroamphet ER; Take 1 capsule by mouth every morning.  Dispense: 30 capsule; Refill: 0  Depression, recurrent (HCC) Assessment & Plan: Fairly stable.  Continue Prozac 10 mg daily.  Follow-up and adjust as needed after starting her new job.   GAD (generalized anxiety disorder) Assessment & Plan: Fairly stable.  Continue Prozac 10 mg daily.  Follow-up and adjust as needed after starting her new job.  Orders: -     FLUoxetine HCl; Take 1 tablet (10 mg total) by mouth daily.  Dispense: 90 tablet; Refill: 1    Return in about 3 months (around 09/12/2023) for follow-up for ADHD, mood, in person or video.   I discussed the  assessment and treatment plan with the patient. The patient was provided an opportunity to ask questions, and all were answered. The patient agreed with the plan and demonstrated an understanding of the instructions.   The patient was advised to call back or seek an in-person evaluation if the symptoms worsen or if the condition fails to improve as anticipated.  The above assessment and management plan was discussed with the patient. The patient  verbalized understanding of and has agreed to the management plan.   [name], PA

## 2023-06-15 ENCOUNTER — Other Ambulatory Visit: Payer: Self-pay

## 2023-06-15 ENCOUNTER — Encounter: Payer: Self-pay | Admitting: Family Medicine

## 2023-06-15 DIAGNOSIS — F339 Major depressive disorder, recurrent, unspecified: Secondary | ICD-10-CM

## 2023-06-15 DIAGNOSIS — F411 Generalized anxiety disorder: Secondary | ICD-10-CM

## 2023-06-15 MED ORDER — FLUOXETINE HCL 10 MG PO CAPS
10.0000 mg | ORAL_CAPSULE | Freq: Every day | ORAL | 1 refills | Status: DC
  Filled 2023-06-15: qty 90, 90d supply, fill #0
  Filled 2023-08-30: qty 90, 90d supply, fill #1

## 2023-06-16 ENCOUNTER — Other Ambulatory Visit: Payer: Self-pay

## 2023-06-20 ENCOUNTER — Ambulatory Visit: Payer: 59 | Attending: Family Medicine | Admitting: Physical Therapy

## 2023-06-20 ENCOUNTER — Encounter: Payer: Self-pay | Admitting: Physical Therapy

## 2023-06-20 DIAGNOSIS — R278 Other lack of coordination: Secondary | ICD-10-CM | POA: Insufficient documentation

## 2023-06-20 DIAGNOSIS — R2681 Unsteadiness on feet: Secondary | ICD-10-CM | POA: Insufficient documentation

## 2023-06-20 DIAGNOSIS — R2689 Other abnormalities of gait and mobility: Secondary | ICD-10-CM | POA: Insufficient documentation

## 2023-06-20 DIAGNOSIS — M6281 Muscle weakness (generalized): Secondary | ICD-10-CM | POA: Insufficient documentation

## 2023-06-20 DIAGNOSIS — R262 Difficulty in walking, not elsewhere classified: Secondary | ICD-10-CM | POA: Insufficient documentation

## 2023-06-20 DIAGNOSIS — R269 Unspecified abnormalities of gait and mobility: Secondary | ICD-10-CM | POA: Insufficient documentation

## 2023-06-20 NOTE — Therapy (Signed)
PT/OT/SLP Screening Form   Time: in 02:02 pm   Time out 02:24 pm    Complaint:  Between the shoulder blades  Past Medical Hx:  syncope, concussions,  Injury Date:__12/15/21__  Pain Scale:  Patient's phone number: [phone]   Hx (this occurrence):  Pt reports she is in pain every time she is at work. She states she feels it is ergonomic. 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 Pt reports that the tylenon/ ibuprofin combination works well. Pt reports some pain at night but "it depends".  Patient reports no numbness, tingling, no back pain when sleeping, and also displays no other significant red flags to warrant a referral to position.    Assessment:  Patient presents with posture related low back pain.  Palpation to spinal segments between shoulder blades reproduced her symptoms.  Patient only reports pain at work with prolonged seated posture working at a desk and does report some improvement with proper ergonomic set up.  Patient provided with exercises of standing rows, standing horizontal abduction, and side-lying open book stretch to target weak muscles as well as to improve patient's postural tolerance.  Patient instructed she would benefit from therapy but patient initially would like to hold off on therapy at that time and would like to try some massage therapy in the meantime.  Instructed she could contact our clinic if she changes her mind and would like to pursue with physical therapy intervention.   Recommendations: Exercises provided above in assessment patient provided with green Thera-Band for horizontal abduction and blue Thera-Band for resisted rows  Comments:    []  Patient would benefit from an MD referral [x]  Patient would benefit from a full PT/OT/ SLP evaluation and treatment. []  No intervention recommended at this time.

## 2023-06-30 ENCOUNTER — Encounter: Payer: Self-pay | Admitting: Internal Medicine

## 2023-07-06 ENCOUNTER — Other Ambulatory Visit: Payer: Self-pay

## 2023-07-13 ENCOUNTER — Ambulatory Visit: Payer: 59 | Admitting: Internal Medicine

## 2023-07-18 ENCOUNTER — Other Ambulatory Visit: Payer: Self-pay

## 2023-07-25 ENCOUNTER — Other Ambulatory Visit: Payer: Self-pay

## 2023-08-03 ENCOUNTER — Other Ambulatory Visit: Payer: Self-pay

## 2023-08-04 ENCOUNTER — Encounter: Payer: Self-pay | Admitting: Family Medicine

## 2023-08-05 ENCOUNTER — Other Ambulatory Visit: Payer: Self-pay

## 2023-08-14 ENCOUNTER — Encounter: Payer: Self-pay | Admitting: *Deleted

## 2023-08-14 ENCOUNTER — Ambulatory Visit
Admission: EM | Admit: 2023-08-14 | Discharge: 2023-08-14 | Disposition: A | Discharge status: Home / Self Care | Payer: Commercial Managed Care - PPO | Attending: Emergency Medicine | Admitting: Emergency Medicine

## 2023-08-14 DIAGNOSIS — J069 Acute upper respiratory infection, unspecified: Secondary | ICD-10-CM

## 2023-08-14 LAB — POCT INFLUENZA A/B
Influenza A, POC: NEGATIVE
Influenza B, POC: NEGATIVE

## 2023-08-14 LAB — POCT RAPID STREP A (OFFICE): Rapid Strep A Screen: NEGATIVE

## 2023-08-14 MED ORDER — PROMETHAZINE-DM 6.25-15 MG/5ML PO SYRP
5.0000 mL | ORAL_SOLUTION | Freq: Four times a day (QID) | ORAL | 0 refills | Status: DC | PRN
Start: 2023-08-14 — End: 2023-09-22

## 2023-08-14 NOTE — Discharge Instructions (Addendum)
 Strep and flu are negative, throat cx pending.Most likely you have a viral illness: no antibiotic is indicated at this time, May treat with OTC meds of choice. Make sure to drink plenty of fluids to stay hydrated(gatorade, water, popsicles,jello,etc), avoid caffeine products. Follow up with PCP. Return as needed.

## 2023-08-14 NOTE — ED Triage Notes (Signed)
 Patient states cough, sore throat and bodyaches since yesterday.  Taking OTC Mucinex, Guanfacine, Tylenol

## 2023-08-14 NOTE — ED Provider Notes (Signed)
 Renaldo Fiddler    CSN: [id number] Arrival date & time: 08/14/23  1040      History   Chief Complaint Chief Complaint  Patient presents with   Sore Throat   Cough   Headache    HPI Kristin Orozco is a 28 y.o. female.   28 year old female, Kristin Orozco, presents to urgent care for evaluation of cough, sore throat, and bodyaches that started yesterday.  Patient is taking over-the-counter Mucinex, guaifenesin, and Tylenol for symptom management without relief.  Requesting work note  The history is provided by the patient. No language interpreter was used.    Past Medical History:  Diagnosis Date   Anxiety    Syncope    Tachycardia    Thyroid disease    Hx of thyroid nodule--followed by PCP    Patient Active Problem List   Diagnosis Date Noted   Viral URI with cough 08/14/2023   PVCs (premature ventricular contractions) 05/14/2021   Atopic dermatitis 10/19/2020   Depression, recurrent (HCC) 07/23/2019   Attention deficit hyperactivity disorder (ADHD), combined type 07/23/2019   GAD (generalized anxiety disorder) 12/14/2018   Lymphadenopathy 11/30/2018   Elevated BP without diagnosis of hypertension 11/30/2018   Thyroid nodule 07/06/2018   Syncope 12/14/2013    History reviewed. No pertinent surgical history.  OB History     Gravida  0   Para  0   Term  0   Preterm  0   AB  0   Living  0      SAB  0   IAB  0   Ectopic  0   Multiple  0   Live Births  0            Home Medications    Prior to Admission medications   Medication Sig Start Date End Date Taking? Authorizing Provider  promethazine-dextromethorphan (PROMETHAZINE-DM) 6.25-15 MG/5ML syrup Take 5 mLs by mouth 4 (four) times daily as needed for cough. 08/14/23  Yes Shaquitta Burbridge, Para March, NP  amphetamine-dextroamphetamine (ADDERALL XR) 15 MG 24 hr capsule Take 1 capsule by mouth every morning. 07/14/23 09/04/23  [name], PA  amphetamine-dextroamphetamine  (ADDERALL XR) 15 MG 24 hr capsule Take 1 capsule by mouth every morning. 06/14/23 08/07/23  [name], PA  amphetamine-dextroamphetamine (ADDERALL XR) 15 MG 24 hr capsule Take 1 capsule by mouth every morning. 08/13/23 09/12/23  [name], PA  FLUoxetine (PROZAC) 10 MG capsule Take 1 capsule (10 mg total) by mouth daily. 06/15/23   [name], PA  fluticasone (FLONASE) 50 MCG/ACT nasal spray Place 2 sprays into both nostrils daily. 05/10/23   Birder Robson, MD  levocetirizine (XYZAL) 5 MG tablet Take 1 tablet (5 mg total) by mouth every evening. 05/10/23   Birder Robson, MD  norgestimate-ethinyl estradiol (ORTHO-CYCLEN) 0.25-35 MG-MCG tablet Take 1 tablet by mouth daily. 08/20/20 08/22/20  Federico Flake, MD    Family History Family History  Problem Relation Age of Onset   Hypertension Mother    ADD / ADHD Mother    Anxiety disorder Mother    Depression Mother    Miscarriages / [place] Mother    COPD Father    Asthma Maternal Aunt    Depression Maternal Aunt    Lupus Maternal Uncle    Multiple sclerosis Maternal Grandmother    Cancer Maternal Grandmother        breast and bone   Diabetes Maternal Grandmother    Breast cancer Maternal Grandmother  63       mets to bone   Thyroid disease Maternal Grandmother        hypothyroid   Depression Maternal Grandmother    Obesity Maternal Grandmother    Heart attack Maternal Grandfather    Heart disease Maternal Grandfather    Hypertension Maternal Grandfather     Social History Social History   Tobacco Use   Smoking status: Former    Current packs/day: 0.00    Types: Cigarettes    Quit date: 05/20/2018    Years since quitting: 5.2    Passive exposure: Current (Mother)   Smokeless tobacco: Former    Quit date: 05/22/2019   Tobacco comments:    stopped in Nov 2019  Vaping Use   Vaping status: Former  Substance Use Topics   Alcohol use: Yes    Alcohol/week: 3.0 standard drinks of alcohol     Types: 3 Glasses of wine per week    Comment: rarely   Drug use: No     Allergies   Patient has no known allergies.   Review of Systems Review of Systems  Constitutional:  Positive for fever.  HENT:  Positive for congestion and sore throat.   Respiratory:  Positive for cough.   Musculoskeletal:  Positive for myalgias.  All other systems reviewed and are negative.    Physical Exam Triage Vital Signs ED Triage Vitals  Encounter Vitals Group     BP 08/14/23 1207 119/77     Systolic BP Percentile --      Diastolic BP Percentile --      Pulse Rate 08/14/23 1207 98     Resp 08/14/23 1207 18     Temp 08/14/23 1207 99.5 F (37.5 C)     Temp Source 08/14/23 1207 Oral     SpO2 08/14/23 1207 98 %     Weight 08/14/23 1205 150 lb (68 kg)     Height 08/14/23 1205 5\' 3"  (1.6 m)     Head Circumference --      Peak Flow --      Pain Score 08/14/23 1205 7     Pain Loc --      Pain Education --      Exclude from Growth Chart --    No data found.  Updated Vital Signs BP 119/77 (BP Location: Left Arm)   Pulse 98   Temp 99.5 F (37.5 C) (Oral)   Resp 18   Ht 5\' 3"  (1.6 m)   Wt 150 lb (68 kg)   LMP 07/20/2023 (Approximate)   SpO2 98%   BMI 26.57 kg/m   Visual Acuity Right Eye Distance:   Left Eye Distance:   Bilateral Distance:    Right Eye Near:   Left Eye Near:    Bilateral Near:     Physical Exam Vitals and nursing note reviewed.  Constitutional:      General: She is not in acute distress.    Appearance: She is well-developed.  HENT:     Head: Normocephalic.     Right Ear: Tympanic membrane is retracted.     Left Ear: Tympanic membrane is retracted.     Nose: Congestion present.     Mouth/Throat:     Lips: Pink.     Mouth: Mucous membranes are moist.     Pharynx: Oropharynx is clear.  Eyes:     General: Lids are normal.     Conjunctiva/sclera: Conjunctivae normal.     Pupils: Pupils are equal,  round, and reactive to light.  Neck:     Trachea: No  tracheal deviation.  Cardiovascular:     Rate and Rhythm: Normal rate and regular rhythm.     Pulses: Normal pulses.     Heart sounds: Normal heart sounds. No murmur heard. Pulmonary:     Effort: Pulmonary effort is normal.     Breath sounds: Normal breath sounds and air entry.  Abdominal:     General: Bowel sounds are normal.     Palpations: Abdomen is soft.     Tenderness: There is no abdominal tenderness.  Musculoskeletal:        General: Normal range of motion.     Cervical back: Normal range of motion.  Lymphadenopathy:     Cervical: No cervical adenopathy.  Skin:    General: Skin is warm and dry.     Findings: No rash.  Neurological:     General: No focal deficit present.     Mental Status: She is alert and oriented to person, place, and time.     GCS: GCS eye subscore is 4. GCS verbal subscore is 5. GCS motor subscore is 6.  Psychiatric:        Attention and Perception: Attention normal.        Mood and Affect: Mood normal.        Speech: Speech normal.        Behavior: Behavior normal. Behavior is cooperative.      UC Treatments / Results  Labs (all labs ordered are listed, but only abnormal results are displayed) Labs Reviewed  POCT INFLUENZA A/B - Normal  POCT RAPID STREP A (OFFICE) - Normal  CULTURE, GROUP A STREP [facility])    EKG   Radiology No results found.  Procedures Procedures (including critical care time)  Medications Ordered in UC Medications - No data to display  Initial Impression / Assessment and Plan / UC Course  I have reviewed the triage vital signs and the nursing notes.  Pertinent labs & imaging results that were available during my care of the patient were reviewed by me and considered in my medical decision making (see chart for details).    Discussed exam findings and plan of care with patient, strict go to ER precautions given.   Patient verbalized understanding to this provider.  Ddx: Viral URI w cough Final Clinical  Impressions(s) / UC Diagnoses   Final diagnoses:  Viral URI with cough     Discharge Instructions      Strep and flu are negative, throat cx pending.Most likely you have a viral illness: no antibiotic is indicated at this time, May treat with OTC meds of choice. Make sure to drink plenty of fluids to stay hydrated(gatorade, water, popsicles,jello,etc), avoid caffeine products. Follow up with PCP. Return as needed.     ED Prescriptions     Medication Sig Dispense Auth. Provider   promethazine-dextromethorphan (PROMETHAZINE-DM) 6.25-15 MG/5ML syrup Take 5 mLs by mouth 4 (four) times daily as needed for cough. 118 mL Jaedon Siler, Para March, NP      PDMP not reviewed this encounter.   Clancy Gourd, NP 08/14/23 1322

## 2023-08-17 LAB — CULTURE, GROUP A STREP (THRC)

## 2023-08-23 ENCOUNTER — Other Ambulatory Visit: Payer: Self-pay

## 2023-08-23 ENCOUNTER — Encounter: Payer: Self-pay | Admitting: Internal Medicine

## 2023-08-23 ENCOUNTER — Ambulatory Visit: Payer: Commercial Managed Care - PPO | Admitting: Internal Medicine

## 2023-08-23 ENCOUNTER — Other Ambulatory Visit (HOSPITAL_COMMUNITY): Payer: Self-pay

## 2023-08-23 VITALS — BP 124/80 | HR 78 | Temp 98.3°F

## 2023-08-23 DIAGNOSIS — J302 Other seasonal allergic rhinitis: Secondary | ICD-10-CM | POA: Diagnosis not present

## 2023-08-23 DIAGNOSIS — J3089 Other allergic rhinitis: Secondary | ICD-10-CM

## 2023-08-23 DIAGNOSIS — L858 Other specified epidermal thickening: Secondary | ICD-10-CM | POA: Diagnosis not present

## 2023-08-23 MED ORDER — AZELASTINE HCL 0.1 % NA SOLN
2.0000 | Freq: Two times a day (BID) | NASAL | 5 refills | Status: DC | PRN
  Filled 2023-08-23: qty 30, 50d supply, fill #0

## 2023-08-23 NOTE — Patient Instructions (Addendum)
 Allergic Rhinitis - SPT 05/2023: positive to weeds, trees, molds, dust mites, cats, cockroach  - Use nasal saline rinses before nose sprays such as with Neilmed Sinus Rinse.  Use distilled water.   - Use Flonase 2 sprays each nostril daily. Aim upward and outward. - Use Azelastine 2 sprays each nostril twice daily for congestion, drainage, sneezing, runny nose.  Aim upward and outward.  - Use Xyzal 5 mg daily.  - Consider allergy shots as long term control of your symptoms by teaching your immune system to be more tolerant of your allergy triggers.   Keratosis Pilaris  - Exfoliate gently. When you exfoliate your skin, you remove the dead skin cells from the surface. You can slough off these dead cells gently with a loofah, buff puff, or rough washcloth. Avoid scrubbing your skin, which tends to irritate the skin and worsen keratosis pilaris. - Apply Amlactin cream or Cerave-SA cream.  - Slather on moisturizer- cream or ointment.  You want to apply the moisturizer: after bathing and when your skin feels dry, and at least 2 or 3 times a day

## 2023-08-23 NOTE — Progress Notes (Signed)
 FOLLOW UP Date of Service/Encounter:  08/23/23   Subjective:  Kristin Orozco (DOB: 12-Dec-1995) is a 28 y.o. female who [facility] for follow up for allergic rhinitis and keratosis pilaris.   History obtained from: chart review and patient. Last seen 06/01/2023 for testing; positive to multiple aeroallergens. Discussed uncontrolled allergies can lead to frequent sinus infections due to uncontrolled underlying inflammation.  Started on Flonase/Xyzal daily.  Also discussed use of Amlactin/Cerave-SA for KP.   Since last visit, reports no further sinus infections requiring antibiotic use.  She did have a viral URI in December, not sure what it was, went to urgent care and was strep/flu negative. She is recovering from it.  Has some mild drainage and stuffy ears.  Using Xyzal and Flonase daily.  Does note some sneezing with her allergies on and off.  Not interested in AIT at this time.  She is now working for home and hopes that will help with her sinus symptoms.  Skin is doing well.  Some roughness on upper arms, uses EOS lotion to moisturize.   Past Medical History: Past Medical History:  Diagnosis Date   Anxiety    Syncope    Tachycardia    Thyroid disease    Hx of thyroid nodule--followed by PCP    Objective:  BP 124/80 (BP Location: Left Arm, Patient Position: Sitting, Cuff Size: Normal)   Pulse 78   Temp 98.3 F (36.8 C) (Temporal)   LMP 07/20/2023 (Approximate)   SpO2 97%  There is no height or weight on file to calculate BMI. Physical Exam: GEN: alert, well developed HEENT: clear conjunctiva, TM slightly hazy but no erythema/bulging, nose with mild inferior turbinate hypertrophy, pink nasal mucosa, + slight clear rhinorrhea, + cobblestoning HEART: regular rate and rhythm, no murmur LUNGS: clear to auscultation bilaterally, no coughing, unlabored respiration SKIN: some rough skin on bl upper arms   Assessment:   1. Seasonal  and perennial allergic rhinitis   2. Keratosis pilaris     Plan/Recommendations:  Allergic Rhinitis - Discussed most recent symptoms are likely due to viral infection and should resolve with time. Can add Azelastine PRN or consider AIT if symptoms persist/worsen especially with change in season.  - SPT 05/2023: positive to weeds, trees, molds, dust mites, cats, cockroach  - Use nasal saline rinses before nose sprays such as with Neilmed Sinus Rinse.  Use distilled water.   - Use Flonase 2 sprays each nostril daily. Aim upward and outward. - Use Azelastine 2 sprays each nostril twice daily for congestion, drainage, sneezing, runny nose.  Aim upward and outward.  - Use Xyzal 5 mg daily.  - Consider allergy shots as long term control of your symptoms by teaching your immune system to be more tolerant of your allergy triggers.   Keratosis Pilaris  - Controlled.  - Exfoliate gently. When you exfoliate your skin, you remove the dead skin cells from the surface. You can slough off these dead cells gently with a loofah, buff puff, or rough washcloth. Avoid scrubbing your skin, which tends to irritate the skin and worsen keratosis pilaris. - Apply Amlactin cream or Cerave-SA cream.  - Slather on moisturizer- cream or ointment.  You want to apply the moisturizer: after bathing and when your skin feels dry, and at least 2 or 3 times a day     Return in about 6 months (around 02/20/2024).  Alesia Morin, MD [facility]  [place]

## 2023-08-30 ENCOUNTER — Other Ambulatory Visit: Payer: Self-pay | Admitting: Family Medicine

## 2023-08-30 DIAGNOSIS — F902 Attention-deficit hyperactivity disorder, combined type: Secondary | ICD-10-CM

## 2023-08-31 ENCOUNTER — Other Ambulatory Visit: Payer: Self-pay

## 2023-08-31 ENCOUNTER — Other Ambulatory Visit (HOSPITAL_COMMUNITY): Payer: Self-pay

## 2023-08-31 MED ORDER — AMPHETAMINE-DEXTROAMPHET ER 15 MG PO CP24
15.0000 mg | ORAL_CAPSULE | ORAL | 0 refills | Status: DC
  Filled 2023-08-31: qty 30, 30d supply, fill #0

## 2023-09-22 ENCOUNTER — Encounter: Payer: Self-pay | Admitting: Family Medicine

## 2023-09-22 ENCOUNTER — Other Ambulatory Visit (HOSPITAL_COMMUNITY): Payer: Self-pay

## 2023-09-22 ENCOUNTER — Telehealth: Admitting: Family Medicine

## 2023-09-22 VITALS — Ht 63.0 in | Wt 150.0 lb

## 2023-09-22 DIAGNOSIS — F339 Major depressive disorder, recurrent, unspecified: Secondary | ICD-10-CM | POA: Diagnosis not present

## 2023-09-22 DIAGNOSIS — F902 Attention-deficit hyperactivity disorder, combined type: Secondary | ICD-10-CM

## 2023-09-22 MED ORDER — AMPHETAMINE-DEXTROAMPHET ER 15 MG PO CP24
15.0000 mg | ORAL_CAPSULE | ORAL | 0 refills | Status: DC
  Filled 2023-12-04: qty 30, 30d supply, fill #0

## 2023-09-22 MED ORDER — AMPHETAMINE-DEXTROAMPHET ER 15 MG PO CP24
15.0000 mg | ORAL_CAPSULE | ORAL | 0 refills | Status: DC
  Filled 2023-10-06: qty 30, 30d supply, fill #0

## 2023-09-22 MED ORDER — AMPHETAMINE-DEXTROAMPHET ER 15 MG PO CP24
15.0000 mg | ORAL_CAPSULE | ORAL | 0 refills | Status: DC
  Filled 2023-11-02 – 2023-11-04 (×2): qty 30, 30d supply, fill #0

## 2023-09-22 NOTE — Assessment & Plan Note (Signed)
 No changes.  Continue Adderall 15 mg daily.  32-month supply sent into [facility].

## 2023-09-22 NOTE — Progress Notes (Signed)
   Established Patient Office Visit  Subjective   Patient ID: Kristin Orozco, female    DOB: Nov 15, 1995  Age: 28 y.o. MRN: [id number]  Chief Complaint  Patient presents with   Medical Management of Chronic Issues  Patient location: Home Provider location: [facility] Method of visit: Video was attempted but unsuccessful.  Switched to audio. Duration of encounter 20 minutes  HPI ADHD-patient takes Adderall XR 15 mg daily.  Still feels like medicine is helpful for her.  Does not feel like she needs a change in dose.  Patient continues to take fluoxetine 10 mg.  Again no changes needed.  Patient still feels like it is helping and she has been stable on this medication for a while.  Patient would like her medication sent by [facility] mail order.  The ASCVD Risk score (Arnett DK, et al., 2019) failed to calculate for the following reasons:   The 2019 ASCVD risk score is only valid for ages 59 to 25  Health Maintenance Due  Topic Date Due   COVID-19 Vaccine (3 - 2024-25 season) 02/27/2023      Objective:     Ht 5\' 3"  (1.6 m)   Wt 150 lb (68 kg)   LMP 09/15/2023   BMI 26.57 kg/m    Physical Exam Unable to assess physical exam over the phone   No results found for any visits on 09/22/23.      Assessment & Plan:   Depression, recurrent (HCC) Assessment & Plan: No questions or tenderness.  Tolerating medication.  Continue fluoxetine 10 mg.   Attention deficit hyperactivity disorder (ADHD), combined type Assessment & Plan: No changes.  Continue Adderall 15 mg daily.  30-month supply sent into [facility].  Orders: -     Amphetamine-Dextroamphet ER; Take 1 capsule by mouth every morning.  Dispense: 30 capsule; Refill: 0 -     Amphetamine-Dextroamphet ER; Take 1 capsule by mouth every morning.  Dispense: 30 capsule; Refill: 0 -     Amphetamine-Dextroamphet ER; Take 1 capsule by mouth every morning.  Dispense: 30 capsule; Refill: 0     No  follow-ups on file.    Sandre Kitty, MD

## 2023-09-22 NOTE — Assessment & Plan Note (Signed)
 No questions or tenderness.  Tolerating medication.  Continue fluoxetine 10 mg.

## 2023-10-06 ENCOUNTER — Other Ambulatory Visit: Payer: Self-pay

## 2023-10-06 ENCOUNTER — Other Ambulatory Visit (HOSPITAL_COMMUNITY): Payer: Self-pay

## 2023-10-19 ENCOUNTER — Other Ambulatory Visit (HOSPITAL_COMMUNITY): Payer: Self-pay

## 2023-11-03 ENCOUNTER — Other Ambulatory Visit (HOSPITAL_COMMUNITY): Payer: Self-pay

## 2023-11-03 ENCOUNTER — Other Ambulatory Visit: Payer: Self-pay

## 2023-11-04 ENCOUNTER — Other Ambulatory Visit (HOSPITAL_COMMUNITY): Payer: Self-pay

## 2023-11-12 ENCOUNTER — Other Ambulatory Visit: Payer: Self-pay | Admitting: Internal Medicine

## 2023-11-15 ENCOUNTER — Other Ambulatory Visit (HOSPITAL_COMMUNITY): Payer: Self-pay

## 2023-11-15 ENCOUNTER — Telehealth: Admitting: Family Medicine

## 2023-11-15 DIAGNOSIS — S70361A Insect bite (nonvenomous), right thigh, initial encounter: Secondary | ICD-10-CM | POA: Diagnosis not present

## 2023-11-15 DIAGNOSIS — W57XXXA Bitten or stung by nonvenomous insect and other nonvenomous arthropods, initial encounter: Secondary | ICD-10-CM | POA: Diagnosis not present

## 2023-11-15 DIAGNOSIS — L539 Erythematous condition, unspecified: Secondary | ICD-10-CM | POA: Diagnosis not present

## 2023-11-15 MED ORDER — LEVOCETIRIZINE DIHYDROCHLORIDE 5 MG PO TABS
5.0000 mg | ORAL_TABLET | Freq: Every evening | ORAL | 5 refills | Status: AC
Start: 2023-11-15 — End: ?
  Filled 2023-11-15: qty 30, 30d supply, fill #0

## 2023-11-15 NOTE — Progress Notes (Signed)
 E-Visit for Insect Sting/Bite from insect  Thank you for describing the insect sting/bite for us .  Here is how we plan to help!  An uncomplicated insect sting that just occurred and can be closely followed using the instructions in your care plan.   Provided a home care guide for insect stings and instructions on when to call for help.  What can be used to prevent Insect Stings?  Insect repellant with at least 20% DEET.  Wearing long pants and shirts with socks and shoes.  Wear dark or drab-colored clothes rather than bright colors.  Avoid using perfumes and hair sprays; these attract insects.  HOME CARE ADVICE:   1.  You can also use hydrocortisone cream 0.5% or 1% up to 4 times daily as needed for itching. 2.  If the sting becomes very itchy, take Benadryl 25-50 mg, follow directions on box. 3.  Wash the area 2-3 times daily with antibacterial soap and warm water.   7. Call your Doctor if: Fever, a severe headache, or rash occur in the next 2 weeks. Sting area begins to look infected. Redness and swelling worsens after home treatment. Your current symptoms become worse.    MAKE SURE YOU:  Understand these instructions. Will watch your condition. Will get help right away if you are not doing well or get worse.  Thank you for choosing an e-visit.  Your e-visit answers were reviewed by a board certified advanced clinical practitioner to complete your personal care plan. Depending upon the condition, your plan could have included both over the counter or prescription medications.  Please review your pharmacy choice. Make sure the pharmacy is open so you can pick up prescription now. If there is a problem, you may contact your provider through [employer] and have the prescription routed to another pharmacy.  Your safety is important to us . If you have drug allergies check your prescription carefully.   For the next 24 hours you can use MyChart to ask questions about  today's visit, request a non-urgent call back, or ask for a work or school excuse. You will get an email in the next two days asking about your experience. I hope that your e-visit has been valuable and will speed your recovery.  I provided 5 minutes of non face-to-face time during this encounter for chart review, medication and order placement, as well as and documentation.

## 2023-11-23 ENCOUNTER — Other Ambulatory Visit: Payer: Self-pay | Admitting: Family Medicine

## 2023-11-23 ENCOUNTER — Other Ambulatory Visit: Payer: Self-pay

## 2023-11-23 ENCOUNTER — Other Ambulatory Visit (HOSPITAL_COMMUNITY): Payer: Self-pay

## 2023-11-23 DIAGNOSIS — F411 Generalized anxiety disorder: Secondary | ICD-10-CM

## 2023-11-23 DIAGNOSIS — F339 Major depressive disorder, recurrent, unspecified: Secondary | ICD-10-CM

## 2023-11-23 MED ORDER — FLUOXETINE HCL 10 MG PO CAPS
10.0000 mg | ORAL_CAPSULE | Freq: Every day | ORAL | 1 refills | Status: DC
  Filled 2023-11-23: qty 90, 90d supply, fill #0
  Filled 2024-03-04: qty 90, 90d supply, fill #1

## 2023-12-05 ENCOUNTER — Other Ambulatory Visit (HOSPITAL_COMMUNITY): Payer: Self-pay

## 2023-12-08 ENCOUNTER — Other Ambulatory Visit: Payer: Self-pay

## 2023-12-08 MED ORDER — LORAZEPAM 1 MG PO TABS
1.0000 mg | ORAL_TABLET | Freq: Every day | ORAL | 0 refills | Status: DC
  Filled 2023-12-08: qty 4, 4d supply, fill #0

## 2023-12-28 ENCOUNTER — Ambulatory Visit: Admitting: Family Medicine

## 2024-01-31 ENCOUNTER — Other Ambulatory Visit: Payer: Self-pay

## 2024-01-31 ENCOUNTER — Other Ambulatory Visit: Payer: Self-pay | Admitting: Family Medicine

## 2024-01-31 ENCOUNTER — Other Ambulatory Visit (HOSPITAL_COMMUNITY): Payer: Self-pay

## 2024-01-31 DIAGNOSIS — F902 Attention-deficit hyperactivity disorder, combined type: Secondary | ICD-10-CM

## 2024-01-31 MED ORDER — AMPHETAMINE-DEXTROAMPHET ER 15 MG PO CP24
15.0000 mg | ORAL_CAPSULE | ORAL | 0 refills | Status: DC
Start: 2024-01-31 — End: 2024-03-15
  Filled 2024-01-31: qty 30, 30d supply, fill #0

## 2024-02-09 ENCOUNTER — Other Ambulatory Visit (HOSPITAL_COMMUNITY): Payer: Self-pay

## 2024-02-09 ENCOUNTER — Telehealth (INDEPENDENT_AMBULATORY_CARE_PROVIDER_SITE_OTHER): Payer: Self-pay | Admitting: Family Medicine

## 2024-02-09 DIAGNOSIS — F339 Major depressive disorder, recurrent, unspecified: Secondary | ICD-10-CM

## 2024-02-09 DIAGNOSIS — F902 Attention-deficit hyperactivity disorder, combined type: Secondary | ICD-10-CM | POA: Diagnosis not present

## 2024-02-09 MED ORDER — AMPHETAMINE-DEXTROAMPHET ER 15 MG PO CP24
15.0000 mg | ORAL_CAPSULE | ORAL | 0 refills | Status: DC
  Filled 2024-03-04 (×3): qty 30, 30d supply, fill #0

## 2024-02-09 MED ORDER — AMPHETAMINE-DEXTROAMPHET ER 15 MG PO CP24
15.0000 mg | ORAL_CAPSULE | ORAL | 0 refills | Status: DC
  Filled 2024-03-31: qty 30, 30d supply, fill #0

## 2024-02-09 NOTE — Assessment & Plan Note (Signed)
 Patient finds herself being more irritable lately while adjusting to her new job.  She declined medication changes.  She is open to considering propranolol to help with irritability/anger in the future.  States that she is going to look into therapy options now that she has new insurance.

## 2024-02-09 NOTE — Assessment & Plan Note (Signed)
 Continue with Adderall  XR 15 mg

## 2024-02-09 NOTE — Progress Notes (Signed)
   Established Patient Office Visit  Subjective   Patient ID: Kristin Orozco, female    DOB: [birth year]  Age: 28 y.o. MRN: [id number]  Chief Complaint  Patient presents with   Follow Up ADHD    HPI Patient location: Car Provider location: [facility] primary care Method of visit: Video Duration of call: 15 minutes.  Pt here for follow-up of ADHD medication and fluoxetine .  Patient recently started a new job and the adjustment has been stressful for her.  She finds her self being more irritable and quick to anger.  She denies any difficulty sleeping.  Denies any significant appetite changes.  We talked about her fluoxetine .  She does not want to change medications at this time or increase the dose.  We discussed other options including bupropion (which the patient says she did not tolerate in the past) and BuSpar, as well as propranolol.  Patient would like to look more into propranolol and talk to me about it further at next visit.  No issues with her Adderall .  Does not typically take it on the weekends when she is not working.  The ASCVD Risk score (Arnett DK, et al., 2019) failed to calculate for the following reasons:   The 2019 ASCVD risk score is only valid for ages 63 to 73  Health Maintenance Due  Topic Date Due   HPV VACCINES (1 - 3-dose SCDM series) Never done   COVID-19 Vaccine (3 - 2024-25 season) 02/27/2023   INFLUENZA VACCINE  01/27/2024      Objective:     There were no vitals taken for this visit.   Physical Exam General: Alert, oriented Pulmonary: No respiratory distress, speaking full sentences without difficulty Psych: Pleasant affect.   No results found for any visits on 02/09/24.      Assessment & Plan:   Attention deficit hyperactivity disorder (ADHD), combined type Assessment & Plan: Continue with Adderall  XR 15 mg  Orders: -     Amphetamine -Dextroamphet ER; Take 1 capsule by mouth every morning.  Dispense: 30 capsule; Refill: 0 -      Amphetamine -Dextroamphet ER; Take 1 capsule by mouth every morning.  Dispense: 30 capsule; Refill: 0  Depression, recurrent (HCC) Assessment & Plan: Patient finds herself being more irritable lately while adjusting to her new job.  She declined medication changes.  She is open to considering propranolol to help with irritability/anger in the future.  States that she is going to look into therapy options now that she has new insurance.      No follow-ups on file.    Toribio MARLA Slain, MD

## 2024-02-13 ENCOUNTER — Telehealth: Payer: Self-pay

## 2024-02-13 ENCOUNTER — Other Ambulatory Visit: Payer: Self-pay | Admitting: Family Medicine

## 2024-02-13 DIAGNOSIS — E7800 Pure hypercholesterolemia, unspecified: Secondary | ICD-10-CM

## 2024-02-13 DIAGNOSIS — E041 Nontoxic single thyroid nodule: Secondary | ICD-10-CM

## 2024-02-13 DIAGNOSIS — E663 Overweight: Secondary | ICD-10-CM

## 2024-02-13 DIAGNOSIS — I493 Ventricular premature depolarization: Secondary | ICD-10-CM

## 2024-02-13 NOTE — Telephone Encounter (Signed)
 Copied from CRM #[id number]. Topic: Clinical - Request for Lab/Test Order >> Feb 10, 2024  2:43 PM Kristin Orozco wrote: Reason for CRM: Patient is calling to request lab orders to be placed for her CPE on 03/15/2024.

## 2024-02-13 NOTE — Telephone Encounter (Signed)
 Copied from CRM #[id number]. Topic: General - Billing Inquiry >> Feb 10, 2024  2:46 PM Kristin Orozco wrote: Reason for CRM: Patient is calling regarding her Tele Health appointment from yesterday. Patient stating that she was billed as not insured. Patient reporting that she does have [employer]. ID Number [id number] Group Number- [id number]

## 2024-02-13 NOTE — Telephone Encounter (Signed)
 Insurance have been added to chart.

## 2024-02-13 NOTE — Telephone Encounter (Signed)
 Labs are in

## 2024-02-24 ENCOUNTER — Ambulatory Visit: Payer: Commercial Managed Care - PPO | Admitting: Internal Medicine

## 2024-02-27 ENCOUNTER — Encounter

## 2024-02-27 ENCOUNTER — Telehealth: Admitting: Physician Assistant

## 2024-02-27 DIAGNOSIS — U071 COVID-19: Secondary | ICD-10-CM

## 2024-02-27 MED ORDER — NIRMATRELVIR/RITONAVIR (PAXLOVID)TABLET: 3.0000 | ORAL_TABLET | Freq: Two times a day (BID) | ORAL | 0 refills | Status: AC

## 2024-02-27 MED ORDER — PROMETHAZINE-DM 6.25-15 MG/5ML PO SYRP: 5.0000 mL | ORAL_SOLUTION | Freq: Four times a day (QID) | ORAL | 0 refills | Status: DC | PRN

## 2024-02-27 NOTE — Progress Notes (Signed)
 Virtual Visit Consent   Kristin Orozco, you are scheduled for a virtual visit with a Kristin Orozco provider today. Just as with appointments in the office, your consent must be obtained to participate. Your consent will be active for this visit and any virtual visit you may have with one of our providers in the next 365 days. If you have a MyChart account, a copy of this consent can be sent to you electronically.  As this is a virtual visit, video technology does not allow for your provider to perform a traditional examination. This may limit your provider's ability to fully assess your condition. If your provider identifies any concerns that need to be evaluated in person or the need to arrange testing (such as labs, EKG, etc.), we will make arrangements to do so. Although advances in technology are sophisticated, we cannot ensure that it will always work on either your end or our end. If the connection with a video visit is poor, the visit may have to be switched to a telephone visit. With either a video or telephone visit, we are not always able to ensure that we have a secure connection.  By engaging in this virtual visit, you consent to the provision of healthcare and authorize for your insurance to be billed (if applicable) for the services provided during this visit. Depending on your insurance coverage, you may receive a charge related to this service.  I need to obtain your verbal consent now. Are you willing to proceed with your visit today? [name] has provided verbal consent on 02/27/2024 for a virtual visit (video or telephone). [name], PA-C  Date: 02/27/2024 4:37 PM   Virtual Visit via Video Note   I, [name], connected with  [name]  ([id number], 1995/08/15) on 02/27/24 at  4:30 PM EDT by a video-enabled telemedicine application and verified that I am speaking with the correct person using two identifiers.  Location: Patient: Virtual Visit  Location Patient: Home Provider: Virtual Visit Location Provider: Home Office   I discussed the limitations of evaluation and management by telemedicine and the availability of in person appointments. The patient expressed understanding and agreed to proceed.    History of Present Illness: Kristin Orozco is a 28 y.o. who identifies as a female who was assigned female at birth, and is being seen today for Covid 61.  HPI: URI  This is a new problem. The current episode started yesterday (Symptoms started last night and tested positive for Covid on at home test). The problem has been gradually worsening. There has been no fever. Associated symptoms include chest pain (tightness), congestion, coughing (mixed), headaches, a plugged ear sensation (popping in right ear), rhinorrhea (and post nasal drainage), sinus pain and a sore throat. Pertinent negatives include no diarrhea, ear pain, nausea, vomiting or wheezing. Associated symptoms comments: Aches, fatigue. She has tried acetaminophen  (essential oils, vit c) for the symptoms.     Problems:  Patient Active Problem List   Diagnosis Date Noted   Viral URI with cough 08/14/2023   PVCs (premature ventricular contractions) 05/14/2021   Atopic dermatitis 10/19/2020   Depression, recurrent (HCC) 07/23/2019   Attention deficit hyperactivity disorder (ADHD), combined type 07/23/2019   GAD (generalized anxiety disorder) 12/14/2018   Lymphadenopathy 11/30/2018   Elevated BP without diagnosis of hypertension 11/30/2018   Thyroid nodule 07/06/2018   Syncope 12/14/2013    Allergies: No Known Allergies Medications:  Current Outpatient Medications:    nirmatrelvir /ritonavir  (  PAXLOVID ) 20 x 150 MG & 10 x 100MG  TABS, Take 3 tablets by mouth 2 (two) times daily for 5 days. (Take nirmatrelvir  150 mg two tablets twice daily for 5 days and ritonavir  100 mg one tablet twice daily for 5 days) Patient GFR is 110, Disp: 30 tablet, Rfl: 0    promethazine -dextromethorphan (PROMETHAZINE -DM) 6.25-15 MG/5ML syrup, Take 5 mLs by mouth 4 (four) times daily as needed., Disp: 118 mL, Rfl: 0   amphetamine -dextroamphetamine  (ADDERALL  XR) 15 MG 24 hr capsule, Take 1 capsule by mouth every morning., Disp: 30 capsule, Rfl: 0   [START ON 03/31/2024] amphetamine -dextroamphetamine  (ADDERALL  XR) 15 MG 24 hr capsule, Take 1 capsule by mouth every morning., Disp: 30 capsule, Rfl: 0   [START ON 03/01/2024] amphetamine -dextroamphetamine  (ADDERALL  XR) 15 MG 24 hr capsule, Take 1 capsule by mouth every morning., Disp: 30 capsule, Rfl: 0   azelastine  (ASTELIN ) 0.1 % nasal spray, Place 2 sprays into both nostrils 2 (two) times daily as needed for allergies or rhinitis. Use in each nostril as directed (Patient not taking: Reported on 02/09/2024), Disp: 30 mL, Rfl: 5   FLUoxetine  (PROZAC ) 10 MG capsule, Take 1 capsule (10 mg total) by mouth daily., Disp: 90 capsule, Rfl: 1   levocetirizine (XYZAL ) 5 MG tablet, Take 1 tablet (5 mg total) by mouth every evening., Disp: 30 tablet, Rfl: 5   LORazepam  (ATIVAN ) 1 MG tablet, TAKE ONE TAB AT NIGHT TO GET A GOOD NIGHT'S REST. TAKE SECOND TAB 2 HRS BEFORE DENTAL APPT. MUST HAVE DRIVER TO AND FROM APPT. PRESCRIPTION IS FOR CROWN PREP AND CROWN DELIVERY APPOINTMENTS (Patient not taking: Reported on 02/09/2024), Disp: 4 tablet, Rfl: 0  Observations/Objective: Patient is well-developed, well-nourished in no acute distress.  Resting comfortably at home.  Head is normocephalic, atraumatic.  No labored breathing.  Speech is clear and coherent with logical content.  Patient is alert and oriented at baseline.    Assessment and Plan: 1. COVID (Primary) - nirmatrelvir /ritonavir  (PAXLOVID ) 20 x 150 MG & 10 x 100MG  TABS; Take 3 tablets by mouth 2 (two) times daily for 5 days. (Take nirmatrelvir  150 mg two tablets twice daily for 5 days and ritonavir  100 mg one tablet twice daily for 5 days) Patient GFR is 110  Dispense: 30 tablet;  Refill: 0 - promethazine -dextromethorphan (PROMETHAZINE -DM) 6.25-15 MG/5ML syrup; Take 5 mLs by mouth 4 (four) times daily as needed.  Dispense: 118 mL; Refill: 0  - Continue OTC symptomatic management of choice - Will send OTC vitamins and supplement information through AVS - Paxlovid  and Promethazine  DM prescribed - Push fluids - Rest as needed - Discussed return precautions and when to seek in-person evaluation, sent via AVS as well   Follow Up Instructions: I discussed the assessment and treatment plan with the patient. The patient was provided an opportunity to ask questions and all were answered. The patient agreed with the plan and demonstrated an understanding of the instructions.  A copy of instructions were sent to the patient via MyChart unless otherwise noted below.    The patient was advised to call back or seek an in-person evaluation if the symptoms worsen or if the condition fails to improve as anticipated.    [name], PA-C

## 2024-02-27 NOTE — Patient Instructions (Signed)
 Kristin Orozco, thank you for joining Kristin Orozco Dickinson, PA-C for today's virtual visit.  While this provider is not your primary care provider (PCP), if your PCP is located in our provider database this encounter information will be shared with them immediately following your visit.   A [facility] MyChart account gives you access to today's visit and all your visits, tests, and labs performed at [facility]  click here if you don't have a [facility] MyChart account or go to [email or web address]  Consent: (Patient) Kristin Orozco provided verbal consent for this virtual visit at the beginning of the encounter.  Current Medications:  Current Outpatient Medications:    nirmatrelvir /ritonavir  (PAXLOVID ) 20 x 150 MG & 10 x 100MG  TABS, Take 3 tablets by mouth 2 (two) times daily for 5 days. (Take nirmatrelvir  150 mg two tablets twice daily for 5 days and ritonavir  100 mg one tablet twice daily for 5 days) Patient GFR is 110, Disp: 30 tablet, Rfl: 0   promethazine -dextromethorphan (PROMETHAZINE -DM) 6.25-15 MG/5ML syrup, Take 5 mLs by mouth 4 (four) times daily as needed., Disp: 118 mL, Rfl: 0   amphetamine -dextroamphetamine  (ADDERALL  XR) 15 MG 24 hr capsule, Take 1 capsule by mouth every morning., Disp: 30 capsule, Rfl: 0   [START ON 03/31/2024] amphetamine -dextroamphetamine  (ADDERALL  XR) 15 MG 24 hr capsule, Take 1 capsule by mouth every morning., Disp: 30 capsule, Rfl: 0   [START ON 03/01/2024] amphetamine -dextroamphetamine  (ADDERALL  XR) 15 MG 24 hr capsule, Take 1 capsule by mouth every morning., Disp: 30 capsule, Rfl: 0   azelastine  (ASTELIN ) 0.1 % nasal spray, Place 2 sprays into both nostrils 2 (two) times daily as needed for allergies or rhinitis. Use in each nostril as directed (Patient not taking: Reported on 02/09/2024), Disp: 30 mL, Rfl: 5   FLUoxetine  (PROZAC ) 10 MG capsule, Take 1 capsule (10 mg total) by mouth daily., Disp: 90 capsule, Rfl: 1   levocetirizine  (XYZAL ) 5 MG tablet, Take 1 tablet (5 mg total) by mouth every evening., Disp: 30 tablet, Rfl: 5   LORazepam  (ATIVAN ) 1 MG tablet, TAKE ONE TAB AT NIGHT TO GET A GOOD NIGHT'S REST. TAKE SECOND TAB 2 HRS BEFORE DENTAL APPT. MUST HAVE DRIVER TO AND FROM APPT. PRESCRIPTION IS FOR CROWN PREP AND CROWN DELIVERY APPOINTMENTS (Patient not taking: Reported on 02/09/2024), Disp: 4 tablet, Rfl: 0   Medications ordered in this encounter:  Meds ordered this encounter  Medications   nirmatrelvir /ritonavir  (PAXLOVID ) 20 x 150 MG & 10 x 100MG  TABS    Sig: Take 3 tablets by mouth 2 (two) times daily for 5 days. (Take nirmatrelvir  150 mg two tablets twice daily for 5 days and ritonavir  100 mg one tablet twice daily for 5 days) Patient GFR is 110    Dispense:  30 tablet    Refill:  0    Supervising Provider:   BLAISE ALEENE KIDD [[id number]]   promethazine -dextromethorphan (PROMETHAZINE -DM) 6.25-15 MG/5ML syrup    Sig: Take 5 mLs by mouth 4 (four) times daily as needed.    Dispense:  118 mL    Refill:  0    Supervising Provider:   BLAISE ALEENE KIDD [[id number]]     *If you need refills on other medications prior to your next appointment, please contact your pharmacy*  Follow-Up: Call back or seek an in-person evaluation if the symptoms worsen or if the condition fails to improve as anticipated.  [facility] [phone]  Care Instructions: Can take to lessen severity (if  able): Vit C 500mg  twice daily Quercertin 250-500mg  twice daily Zinc 75-100mg  daily Melatonin 3-6 mg at bedtime Vit D3 1000-2000 IU daily Aspirin 81 mg daily with food Optional: Famotidine 20mg  daily Also can add tylenol /ibuprofen  as needed for fevers and body aches May add Mucinex or Mucinex DM as needed for cough/congestion    Isolation Instructions: You are to isolate at home until you have been fever free for at least 24 hours without a fever-reducing medication, and symptoms have been steadily improving for 24 hours.  At that time,  you can end isolation but need to mask for an additional 5 days.   If you must be around other household members who do not have symptoms, you need to make sure that both you and the family members are masking consistently with a high-quality mask.  If you note any worsening of symptoms despite treatment, please seek an in-person evaluation ASAP. If you note any significant shortness of breath or any chest pain, please seek ER evaluation. Please do not delay care!   COVID-19: What to Do if You Are Sick If you test positive and are an older adult or someone who is at high risk of getting very sick from COVID-19, treatment may be available. Contact a healthcare provider right away after a positive test to determine if you are eligible, even if your symptoms are mild right now. You can also visit a Test to Treat location and, if eligible, receive a prescription from a provider. Don't delay: Treatment must be started within the first few days to be effective. If you have a fever, cough, or other symptoms, you might have COVID-19. Most people have mild illness and are able to recover at home. If you are sick: Keep track of your symptoms. If you have an emergency warning sign (including trouble breathing), call 911. Steps to help prevent the spread of COVID-19 if you are sick If you are sick with COVID-19 or think you might have COVID-19, follow the steps below to care for yourself and to help protect other people in your home and community. Stay home except to get medical care Stay home. Most people with COVID-19 have mild illness and can recover at home without medical care. Do not leave your home, except to get medical care. Do not visit public areas and do not go to places where you are unable to wear a mask. Take care of yourself. Get rest and stay hydrated. Take over-the-counter medicines, such as acetaminophen , to help you feel better. Stay in touch with your doctor. Call before you  get medical care. Be sure to get care if you have trouble breathing, or have any other emergency warning signs, or if you think it is an emergency. Avoid public transportation, ride-sharing, or taxis if possible. Get tested If you have symptoms of COVID-19, get tested. While waiting for test results, stay away from others, including staying apart from those living in your household. Get tested as soon as possible after your symptoms start. Treatments may be available for people with COVID-19 who are at risk for becoming very sick. Don't delay: Treatment must be started early to be effective--some treatments must begin within 5 days of your first symptoms. Contact your healthcare provider right away if your test result is positive to determine if you are eligible. Self-tests are one of several options for testing for the virus that causes COVID-19 and may be more convenient than laboratory-based tests and point-of-care tests. Ask your healthcare provider or  your local health department if you need help interpreting your test results. You can visit your state, tribal, local, and territorial health department's website to look for the latest local information on testing sites. Separate yourself from other people As much as possible, stay in a specific room and away from other people and pets in your home. If possible, you should use a separate bathroom. If you need to be around other people or animals in or outside of the home, wear a well-fitting mask. Tell your close contacts that they may have been exposed to COVID-19. An infected person can spread COVID-19 starting 48 hours (or 2 days) before the person has any symptoms or tests positive. By letting your close contacts know they may have been exposed to COVID-19, you are helping to protect everyone. See COVID-19 and Animals if you have questions about pets. If you are diagnosed with COVID-19, someone from the health department may call you. Answer the  call to slow the spread. Monitor your symptoms Symptoms of COVID-19 include fever, cough, or other symptoms. Follow care instructions from your healthcare provider and local health department. Your local health authorities may give instructions on checking your symptoms and reporting information. When to seek emergency medical attention Look for emergency warning signs* for COVID-19. If someone is showing any of these signs, seek emergency medical care immediately: Trouble breathing Persistent pain or pressure in the chest New confusion Inability to wake or stay awake Pale, gray, or blue-colored skin, lips, or nail beds, depending on skin tone *This list is not all possible symptoms. Please call your medical provider for any other symptoms that are severe or concerning to you. Call 911 or call ahead to your local emergency facility: Notify the operator that you are seeking care for someone who has or may have COVID-19. Call ahead before visiting your doctor Call ahead. Many medical visits for routine care are being postponed or done by phone or telemedicine. If you have a medical appointment that cannot be postponed, call your doctor's office, and tell them you have or may have COVID-19. This will help the office protect themselves and other patients. If you are sick, wear a well-fitting mask You should wear a mask if you must be around other people or animals, including pets (even at home). Wear a mask with the best fit, protection, and comfort for you. You don't need to wear the mask if you are alone. If you can't put on a mask (because of trouble breathing, for example), cover your coughs and sneezes in some other way. Try to stay at least 6 feet away from other people. This will help protect the people around you. Masks should not be placed on young children under age 77 years, anyone who has trouble breathing, or anyone who is not able to remove the mask without help. Cover your coughs and  sneezes Cover your mouth and nose with a tissue when you cough or sneeze. Throw away used tissues in a lined trash can. Immediately wash your hands with soap and water for at least 20 seconds. If soap and water are not available, clean your hands with an alcohol-based hand sanitizer that contains at least 60% alcohol. Clean your hands often Wash your hands often with soap and water for at least 20 seconds. This is especially important after blowing your nose, coughing, or sneezing; going to the bathroom; and before eating or preparing food. Use hand sanitizer if soap and water are not available. Use an  alcohol-based hand sanitizer with at least 60% alcohol, covering all surfaces of your hands and rubbing them together until they feel dry. Soap and water are the best option, especially if hands are visibly dirty. Avoid touching your eyes, nose, and mouth with unwashed hands. Handwashing Tips Avoid sharing personal household items Do not share dishes, drinking glasses, cups, eating utensils, towels, or bedding with other people in your home. Wash these items thoroughly after using them with soap and water or put in the dishwasher. Clean surfaces in your home regularly Clean and disinfect high-touch surfaces (for example, doorknobs, tables, handles, light switches, and countertops) in your sick room and bathroom. In shared spaces, you should clean and disinfect surfaces and items after each use by the person who is ill. If you are sick and cannot clean, a caregiver or other person should only clean and disinfect the area around you (such as your bedroom and bathroom) on an as needed basis. Your caregiver/other person should wait as long as possible (at least several hours) and wear a mask before entering, cleaning, and disinfecting shared spaces that you use. Clean and disinfect areas that may have blood, stool, or body fluids on them. Use household cleaners and disinfectants. Clean visible dirty  surfaces with household cleaners containing soap or detergent. Then, use a household disinfectant. Use a product from [employer] List N: Disinfectants for Coronavirus (COVID-19). Be sure to follow the instructions on the label to ensure safe and effective use of the product. Many products recommend keeping the surface wet with a disinfectant for a certain period of time (look at contact time on the product label). You may also need to wear personal protective equipment, such as gloves, depending on the directions on the product label. Immediately after disinfecting, wash your hands with soap and water for 20 seconds. For completed guidance on cleaning and disinfecting your home, visit Complete Disinfection Guidance. Take steps to improve ventilation at home Improve ventilation (air flow) at home to help prevent from spreading COVID-19 to other people in your household. Clear out COVID-19 virus particles in the air by opening windows, using air filters, and turning on fans in your home. Use this interactive tool to learn how to improve air flow in your home. When you can be around others after being sick with COVID-19 Deciding when you can be around others is different for different situations. Find out when you can safely end home isolation. For any additional questions about your care, contact your healthcare provider or state or local health department. 09/16/2020 Content source: [facility] for Immunization and Respiratory Diseases ([facility]), Division of Viral Diseases This information is not intended to replace advice given to you by your health care provider. Make sure you discuss any questions you have with your health care provider. Document Revised: 10/30/2020 Document Reviewed: 10/30/2020 Elsevier Patient Education  2022 [employer].     If you have been instructed to have an in-person evaluation today at a local Urgent Care facility, please use the link below. It will take you to a  list of all of our available [facility], including address, phone number and hours of operation. Please do not delay care.  [facility]  If you or a family member do not have a primary care provider, use the link below to schedule a visit and establish care. When you choose a  primary care physician or advanced practice provider, you gain a long-term partner in health. Find a  Primary Care Provider  Learn more about [facility]'s in-office and virtual care options:  - Get Care Now

## 2024-03-04 ENCOUNTER — Other Ambulatory Visit (HOSPITAL_COMMUNITY): Payer: Self-pay

## 2024-03-04 ENCOUNTER — Other Ambulatory Visit: Payer: Self-pay

## 2024-03-05 ENCOUNTER — Other Ambulatory Visit: Payer: Self-pay

## 2024-03-08 ENCOUNTER — Other Ambulatory Visit

## 2024-03-08 DIAGNOSIS — E7800 Pure hypercholesterolemia, unspecified: Secondary | ICD-10-CM | POA: Diagnosis not present

## 2024-03-08 DIAGNOSIS — E663 Overweight: Secondary | ICD-10-CM | POA: Diagnosis not present

## 2024-03-08 DIAGNOSIS — E041 Nontoxic single thyroid nodule: Secondary | ICD-10-CM | POA: Diagnosis not present

## 2024-03-09 ENCOUNTER — Ambulatory Visit: Payer: Self-pay | Admitting: Family Medicine

## 2024-03-09 LAB — COMPREHENSIVE METABOLIC PANEL WITH GFR
ALT: 20 IU/L (ref 0–32)
AST: 18 IU/L (ref 0–40)
Albumin: 4.7 g/dL (ref 4.0–5.0)
Alkaline Phosphatase: 125 IU/L — ABNORMAL HIGH (ref 44–121)
BUN/Creatinine Ratio: 13 (ref 9–23)
BUN: 9 mg/dL (ref 6–20)
Bilirubin Total: 0.4 mg/dL (ref 0.0–1.2)
CO2: 23 mmol/L (ref 20–29)
Calcium: 9.3 mg/dL (ref 8.7–10.2)
Chloride: 100 mmol/L (ref 96–106)
Creatinine, Ser: 0.71 mg/dL (ref 0.57–1.00)
Globulin, Total: 2.6 g/dL (ref 1.5–4.5)
Glucose: 86 mg/dL (ref 70–99)
Potassium: 4.2 mmol/L (ref 3.5–5.2)
Sodium: 137 mmol/L (ref 134–144)
Total Protein: 7.3 g/dL (ref 6.0–8.5)
eGFR: 119 mL/min/1.73 (ref >59)

## 2024-03-09 LAB — CBC WITH DIFFERENTIAL/PLATELET
Basophils Absolute: 0.1 x10E3/uL (ref 0.0–0.2)
Basos: 1 %
EOS (ABSOLUTE): 0.2 x10E3/uL (ref 0.0–0.4)
Eos: 2 %
Hematocrit: 43 % (ref 34.0–46.6)
Hemoglobin: 13.8 g/dL (ref 11.1–15.9)
Immature Grans (Abs): 0 x10E3/uL (ref 0.0–0.1)
Immature Granulocytes: 0 %
Lymphocytes Absolute: 2.9 x10E3/uL (ref 0.7–3.1)
Lymphs: 34 %
MCH: 28.6 pg (ref 26.6–33.0)
MCHC: 32.1 g/dL (ref 31.5–35.7)
MCV: 89 fL (ref 79–97)
Monocytes Absolute: 0.7 x10E3/uL (ref 0.1–0.9)
Monocytes: 8 %
Neutrophils Absolute: 4.8 x10E3/uL (ref 1.4–7.0)
Neutrophils: 55 %
Platelets: 439 x10E3/uL (ref 150–450)
RBC: 4.83 x10E6/uL (ref 3.77–5.28)
RDW: 12.2 % (ref 11.7–15.4)
WBC: 8.7 x10E3/uL (ref 3.4–10.8)

## 2024-03-09 LAB — TSH: TSH: 4.39 u[IU]/mL (ref 0.450–4.500)

## 2024-03-09 LAB — LIPID PANEL
Chol/HDL Ratio: 3.7 ratio (ref 0.0–4.4)
Cholesterol, Total: 190 mg/dL (ref 100–199)
HDL: 51 mg/dL (ref >39)
LDL Chol Calc (NIH): 123 mg/dL — ABNORMAL HIGH (ref 0–99)
Triglycerides: 89 mg/dL (ref 0–149)
VLDL Cholesterol Cal: 16 mg/dL (ref 5–40)

## 2024-03-09 LAB — HEMOGLOBIN A1C
Est. average glucose Bld gHb Est-mCnc: 108 mg/dL
Hgb A1c MFr Bld: 5.4 % (ref 4.8–5.6)

## 2024-03-15 ENCOUNTER — Ambulatory Visit (INDEPENDENT_AMBULATORY_CARE_PROVIDER_SITE_OTHER): Admitting: Family Medicine

## 2024-03-15 ENCOUNTER — Encounter: Payer: Self-pay | Admitting: Family Medicine

## 2024-03-15 ENCOUNTER — Other Ambulatory Visit (HOSPITAL_COMMUNITY): Payer: Self-pay

## 2024-03-15 ENCOUNTER — Other Ambulatory Visit: Payer: Self-pay

## 2024-03-15 VITALS — BP 143/92 | HR 98 | Ht 63.0 in | Wt 162.1 lb

## 2024-03-15 DIAGNOSIS — E782 Mixed hyperlipidemia: Secondary | ICD-10-CM | POA: Diagnosis not present

## 2024-03-15 DIAGNOSIS — E785 Hyperlipidemia, unspecified: Secondary | ICD-10-CM | POA: Insufficient documentation

## 2024-03-15 DIAGNOSIS — G5730 Lesion of lateral popliteal nerve, unspecified lower limb: Secondary | ICD-10-CM | POA: Insufficient documentation

## 2024-03-15 DIAGNOSIS — F902 Attention-deficit hyperactivity disorder, combined type: Secondary | ICD-10-CM

## 2024-03-15 DIAGNOSIS — F419 Anxiety disorder, unspecified: Secondary | ICD-10-CM

## 2024-03-15 DIAGNOSIS — F32A Depression, unspecified: Secondary | ICD-10-CM

## 2024-03-15 DIAGNOSIS — Z0000 Encounter for general adult medical examination without abnormal findings: Secondary | ICD-10-CM | POA: Diagnosis not present

## 2024-03-15 MED ORDER — AMPHETAMINE-DEXTROAMPHET ER 15 MG PO CP24
15.0000 mg | ORAL_CAPSULE | ORAL | 0 refills | Status: DC
  Filled 2024-05-28: qty 30, 30d supply, fill #0

## 2024-03-15 MED ORDER — PROPRANOLOL HCL 10 MG PO TABS
10.0000 mg | ORAL_TABLET | Freq: Every day | ORAL | 2 refills | Status: AC | PRN
  Filled 2024-03-15: qty 30, 30d supply, fill #0

## 2024-03-15 MED ORDER — AMPHETAMINE-DEXTROAMPHET ER 15 MG PO CP24
15.0000 mg | ORAL_CAPSULE | ORAL | 0 refills | Status: DC
  Filled 2024-05-31: qty 30, 30d supply, fill #0

## 2024-03-15 NOTE — Patient Instructions (Signed)
 It was nice to see you today,  We addressed the following topics today: -I am sending in a prescription for propranolol .  This medicine can help with anxiety or irritability. - Take it once a day.  If you tolerate it we can switch to the extended release version at some point - The first time you take it I would wait until you are on your day off so that you can know how it affects you. - Your leg pain seems consistent with something called peroneal nerve entrapment.  I do not have any exercises here that I can show you to do at home, but if you search videos for peroneal nerve entrapment exercises with physical therapy you can see some examples you can try at home. - I can also send a referral to orthopedics whenever you would like. - Your cholesterol level was slightly elevated.  I would recommend limiting your saturated fat intake from red meat dairy and processed foods. - Her blood pressure was mildly elevated.  Propranolol  does help somewhat with blood pressure.  I would like your next appointment to be in person so that we can check her blood pressure again.  Have a great day,  Rolan Slain, MD

## 2024-03-15 NOTE — Assessment & Plan Note (Signed)
-   Reports intermittent tingling radiating from the knee down the leg, associated with a crunching sensation, primarily when descending stairs. History of soccer and softball. No instability reported. Exam not detailed beyond auscultation. - Impression is peroneal nerve entrapment. - Discussed potential etiologies including leg crossing. - Plan is to provide handout with stretching exercises for the peroneal nerve. - Advised to follow up if symptoms worsen, or if an orthopedic referral and imaging (X-ray, MRI) are desired.

## 2024-03-15 NOTE — Assessment & Plan Note (Signed)
-   Ongoing issue with stress and anxiety, currently related to family matters after initial stress from a new job. Continues on fluoxetine . Has had intolerable side effects to bupropion and buspirone in the past. - Discussed propranolol  as an option for situational anxiety and physical symptoms of stress. Counseled on mechanism, potential side effects including fatigue, dizziness, and lightheadedness. Advised to take the first dose on a non-work day to assess for side effects. - Will prescribe propranolol , low-dose, short-acting formulation. - Advised on non-pharmacological management including exercise and therapy. Provided resources for finding a therapist (psychologytoday.com).

## 2024-03-15 NOTE — Assessment & Plan Note (Signed)
-   Lab review shows mildly elevated LDL, slightly higher than previous. - Counseled that weight gain may contribute to the increase. Advised on lifestyle modifications including exercise, weight loss, and dietary changes to reduce saturated fat intake (e.g., red meat, dairy, processed foods).

## 2024-03-15 NOTE — Progress Notes (Unsigned)
 Annual physical  Subjective    Patient ID: Kristin Orozco, female    DOB: 22-Apr-1996  Age: 28 y.o. MRN: [id number]  Chief Complaint  Patient presents with   Annual Exam   HPI Kristin Orozco is a 28 y.o. old female here  for annual exam.   Subjective - Reports ongoing stress, previously from a new job in inside sales, now more related to family issues with mother. - Reports recent weight gain of approximately 10 lbs, which is also attributed to stress. - Acknowledges lack of regular exercise but intends to start, recognizing it would be beneficial. Diet is generally healthy but admits to stress eating, e.g., chocolate. - Reports tingling sensation in the leg, starting at the knee and radiating down the leg, sometimes on the posterior aspect. Occurs intermittently, for a while, and is more prominent when descending stairs or on rising after kneeling (e.g., bathing a dog). Describes a crunching or cartilage separating sensation at the knee. Denies knee buckling or giving out.  Medications: Fluoxetine , Adderall . Reports previous trials of bupropion (Wellbutrin), which caused a sensation of itching from the inside out, and buspirone (Buspar), which caused excessive somnolence.  PMH, PSH, FH, Social Hx: PMHx: History of anxiety. Last pap smear was in 2023, next one due in 2026. Prior Depo-Provera use with side effects, now discontinued with return of regular menses. History of mild LDL elevation. PSH: None mentioned. FH: Not discussed. Social Hx: Engaged, no children. Denies current tobacco use, quit in 2016. Reports minimal alcohol use (one glass of wine weekly at most). Denies recreational drug use. 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  ROS: Constitutional: Reports weight gain. Denies fatigue, though has had medication-induced somnolence in the past. Psychiatric: Reports stress and anxiety. Musculoskeletal: Reports knee-related tingling and sensation of crunching.    The  ASCVD Risk score (Arnett DK, et al., 2019) failed to calculate for the following reasons:   The 2019 ASCVD risk score is only valid for ages 28 to 66  Health Maintenance Due  Topic Date Due   HPV VACCINES (1 - 3-dose SCDM series) Never done   COVID-19 Vaccine (3 - 2025-26 season) 02/27/2024      Objective:     BP (!) 143/92   Pulse 98   Ht 5' 3 (1.6 m)   Wt 162 lb 1.9 oz (73.5 kg)   SpO2 100%   BMI 28.72 kg/m  {Vitals History (Optional):23777}  Physical Exam General: Alert, oriented HEENT: PERRLA, EOMI CV: Rate regular no murmurs Pulmonary: Lungs are bilaterally GI: Soft, normal bowel sounds MSK: Strength equal bilaterally, no knee crepitus.  Normal range of motion.  Nontender to palpation. Skin: Warm and dry Psych: Pleasant affect.   No results found for any visits on 03/15/24.      Assessment & Plan:   Physical exam, annual  Attention deficit hyperactivity disorder (ADHD), combined type -     Amphetamine -Dextroamphet ER; Take 1 capsule by mouth every morning.  Dispense: 30 capsule; Refill: 0 -     Amphetamine -Dextroamphet ER; Take 1 capsule by mouth every morning.  Dispense: 30 capsule; Refill: 0  Entrapment neuropathy of common peroneal nerve Assessment & Plan: - Reports intermittent tingling radiating from the knee down the leg, associated with a crunching sensation, primarily when descending stairs. History of soccer and softball. No instability reported. Exam not detailed beyond auscultation. - Impression is peroneal nerve entrapment. - Discussed potential etiologies including leg crossing. - Plan is to provide handout with stretching exercises for  the peroneal nerve. - Advised to follow up if symptoms worsen, or if an orthopedic referral and imaging (X-ray, MRI) are desired.   Moderate mixed hyperlipidemia not requiring statin therapy Assessment & Plan: - Lab review shows mildly elevated LDL, slightly higher than previous. - Counseled that weight gain  may contribute to the increase. Advised on lifestyle modifications including exercise, weight loss, and dietary changes to reduce saturated fat intake (e.g., red meat, dairy, processed foods).   Anxiety and depression Assessment & Plan: - Ongoing issue with stress and anxiety, currently related to family matters after initial stress from a new job. Continues on fluoxetine . Has had intolerable side effects to bupropion and buspirone in the past. - Discussed propranolol  as an option for situational anxiety and physical symptoms of stress. Counseled on mechanism, potential side effects including fatigue, dizziness, and lightheadedness. Advised to take the first dose on a non-work day to assess for side effects. - Will prescribe propranolol , low-dose, short-acting formulation. - Advised on non-pharmacological management including exercise and therapy. Provided resources for finding a therapist ([email or web address]).   Other orders -     Propranolol  HCl; Take 1 tablet (10 mg total) by mouth daily as needed.  Dispense: 30 tablet; Refill: 2     Return in about 3 months (around 06/14/2024) for HTN, adhd.    Toribio MARLA Slain, MD

## 2024-04-01 ENCOUNTER — Other Ambulatory Visit (HOSPITAL_COMMUNITY): Payer: Self-pay

## 2024-04-02 ENCOUNTER — Other Ambulatory Visit: Payer: Self-pay

## 2024-04-30 ENCOUNTER — Other Ambulatory Visit: Payer: Self-pay

## 2024-04-30 ENCOUNTER — Other Ambulatory Visit: Payer: Self-pay | Admitting: Family Medicine

## 2024-04-30 ENCOUNTER — Other Ambulatory Visit (HOSPITAL_COMMUNITY): Payer: Self-pay

## 2024-04-30 DIAGNOSIS — F902 Attention-deficit hyperactivity disorder, combined type: Secondary | ICD-10-CM

## 2024-04-30 MED ORDER — AMPHETAMINE-DEXTROAMPHET ER 15 MG PO CP24
15.0000 mg | ORAL_CAPSULE | ORAL | 0 refills | Status: DC
  Filled 2024-04-30 – 2024-05-01 (×2): qty 30, 30d supply, fill #0

## 2024-05-01 ENCOUNTER — Other Ambulatory Visit (HOSPITAL_COMMUNITY): Payer: Self-pay

## 2024-05-01 ENCOUNTER — Other Ambulatory Visit: Payer: Self-pay

## 2024-05-28 ENCOUNTER — Other Ambulatory Visit: Payer: Self-pay

## 2024-05-28 ENCOUNTER — Other Ambulatory Visit (HOSPITAL_COMMUNITY): Payer: Self-pay

## 2024-05-28 ENCOUNTER — Other Ambulatory Visit: Payer: Self-pay | Admitting: Family Medicine

## 2024-05-28 DIAGNOSIS — F411 Generalized anxiety disorder: Secondary | ICD-10-CM

## 2024-05-28 DIAGNOSIS — F339 Major depressive disorder, recurrent, unspecified: Secondary | ICD-10-CM

## 2024-05-28 MED ORDER — FLUOXETINE HCL 10 MG PO CAPS
10.0000 mg | ORAL_CAPSULE | Freq: Every day | ORAL | 1 refills | Status: AC
  Filled 2024-05-28: qty 90, 90d supply, fill #0

## 2024-05-31 ENCOUNTER — Other Ambulatory Visit: Payer: Self-pay

## 2024-05-31 ENCOUNTER — Other Ambulatory Visit (HOSPITAL_COMMUNITY): Payer: Self-pay

## 2024-06-18 ENCOUNTER — Ambulatory Visit: Admitting: Family Medicine

## 2024-06-18 ENCOUNTER — Encounter: Payer: Self-pay | Admitting: Family Medicine

## 2024-06-18 VITALS — BP 129/77 | HR 69 | Ht 63.0 in | Wt 168.4 lb

## 2024-06-18 DIAGNOSIS — F32A Depression, unspecified: Secondary | ICD-10-CM | POA: Diagnosis not present

## 2024-06-18 DIAGNOSIS — R030 Elevated blood-pressure reading, without diagnosis of hypertension: Secondary | ICD-10-CM | POA: Diagnosis not present

## 2024-06-18 DIAGNOSIS — G5730 Lesion of lateral popliteal nerve, unspecified lower limb: Secondary | ICD-10-CM

## 2024-06-18 DIAGNOSIS — F419 Anxiety disorder, unspecified: Secondary | ICD-10-CM | POA: Diagnosis not present

## 2024-06-18 DIAGNOSIS — F902 Attention-deficit hyperactivity disorder, combined type: Secondary | ICD-10-CM

## 2024-06-18 NOTE — Patient Instructions (Addendum)
" °    °  VISIT SUMMARY: Today, you came in for a follow-up on your knee pain and stress management. We discussed your knee pain, which includes numbness and tingling, especially when descending stairs and while driving. We also talked about your stress levels and your current medications. Additionally, we reviewed your general health maintenance and plans for future appointments.  YOUR PLAN: -KNEE PAIN WITH NEUROPATHIC SYMPTOMS: Your knee pain includes numbness and tingling that radiates from your knee down the side of your leg, especially when going down stairs and sometimes while driving. This could be due to nerve irritation. We offered to refer you to an orthopedist for further evaluation and possible treatments like radiofrequency therapy or shockwave therapy. Please contact us  if you decide to see an orthopedist.  -ATTENTION-DEFICIT HYPERACTIVITY DISORDER (ADHD): Your ADHD is being managed with your current medication, Adderall , and you have not reported any issues. We have sent a refill for your Adderall  for the next three months.  -ANXIETY DISORDER: Your anxiety, particularly related to work stress, can be managed with propranolol  as needed. Although you haven't been using it, you have it available. Please use propranolol  when you feel stressed and contact us  if you need refills or have any issues.  -OBESITY: You are aware of your recent weight gain and plan to start exercising. We encourage you to begin an exercise routine and monitor your weight.  -GENERAL HEALTH MAINTENANCE: We discussed the importance of routine health maintenance, including scheduling physical exams. We will schedule your physical exam in September and plan a follow-up appointment in four to five months.  INSTRUCTIONS: Please contact our office if you decide to pursue an orthopedic consultation for your knee pain. Use propranolol  as needed for anxiety and let us  know if you need refills or have any issues. We will schedule  your physical exam in September and plan a follow-up appointment in four to five months.    "

## 2024-06-18 NOTE — Progress Notes (Unsigned)
 "  Established Patient Office Visit  Subjective   Patient ID: Kristin Orozco, female    DOB: 1996-04-12  Age: 28 y.o. MRN: [id number]  Chief Complaint  Patient presents with   Medical Management of Chronic Issues    Discussed the use of AI scribe software for clinical note transcription with the patient, who gave verbal consent to proceed.  History of Present Illness   Kristin Orozco is a 28 year old female who presents for follow-up on knee pain and stress management.  She experiences knee pain characterized by numbness and tingling radiating from the knee down the lateral side of her leg, particularly when descending stairs. She has adjusted her behavior by using her left leg more cautiously on stairs. Discomfort is also noted when driving, especially when the back of the seat presses against her knee. She has a history of playing sports. She is concerned about the potential for worsening symptoms and mentioned that she has gained weight recently.  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 Her stress levels have improved as she has become more accustomed to her work environment.  She is currently taking Adderall  without any reported issues or changes. She has been on this medication for a long time and recently had a prescription refill sent in.          The ASCVD Risk score (Arnett DK, et al., 2019) failed to calculate for the following reasons:   The 2019 ASCVD risk score is only valid for ages 75 to 38   * - Cholesterol units were assumed  Health Maintenance Due  Topic Date Due   HPV VACCINES (1 - 3-dose SCDM series) Never done   COVID-19 Vaccine (3 - 2025-26 season) 02/27/2024      Objective:     BP 129/77   Pulse 69   Ht 5' 3 (1.6 m)   Wt 168 lb 6.4 oz (76.4 kg)   LMP 06/13/2024   SpO2 98%   BMI 29.83 kg/m   {Vitals History (Optional):23777}  Physical Exam     Gen: alert, oriented Pulm: no respiratory distress Psych: pleasant affect       No results found for any visits on 06/18/24.      Assessment & Plan:   Attention deficit hyperactivity disorder (ADHD), combined type Assessment & Plan: ADHD is managed with current medication regimen. No reported issues with Adderall . - Sent refill for Adderall  for three more months.   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 She has not been using the medication but has it available. - Advised her to use propranolol  as needed for anxiety and to contact the office if refills are needed or if there are any issues.   Entrapment neuropathy of common peroneal nerve Assessment & Plan: Chronic knee pain with neuropathic symptoms, primarily when descending stairs and occasionally while driving. Symptoms include numbness and tingling radiating from the knee down the lateral side of the leg. Symptoms are well-managed but concerning for potential worsening. - Offered referral to orthopedist for further evaluation and management, including potential non-invasive treatments such as radiofrequency therapy or shockwave therapy. - Advised her to contact the office if she decides to pursue orthopedic consultation.       Return in about 5 months (around 11/16/2024) for  add fu.    Toribio MARLA Slain, MD  "

## 2024-06-18 NOTE — Assessment & Plan Note (Signed)
 Chronic knee pain with neuropathic symptoms, primarily when descending stairs and occasionally while driving. Symptoms include numbness and tingling radiating from the knee down the lateral side of the leg. Symptoms are well-managed but concerning for potential worsening. - Offered referral to orthopedist for further evaluation and management, including potential non-invasive treatments such as radiofrequency therapy or shockwave therapy. - Advised her to contact the office if she decides to pursue orthopedic consultation.

## 2024-06-18 NOTE — Assessment & Plan Note (Signed)
 ADHD is managed with current medication regimen. No reported issues with Adderall . - Sent refill for Adderall  for three more months.

## 2024-06-18 NOTE — Assessment & Plan Note (Signed)
 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 She has not been using the medication but has it available. - Advised her to use propranolol  as needed for anxiety and to contact the office if refills are needed or if there are any issues.

## 2024-06-19 NOTE — Assessment & Plan Note (Signed)
 Bp normal today. Not on medication for htn

## 2024-06-29 ENCOUNTER — Other Ambulatory Visit: Payer: Self-pay | Admitting: Family Medicine

## 2024-06-29 ENCOUNTER — Other Ambulatory Visit: Payer: Self-pay

## 2024-06-29 DIAGNOSIS — F902 Attention-deficit hyperactivity disorder, combined type: Secondary | ICD-10-CM

## 2024-06-29 MED ORDER — AMPHETAMINE-DEXTROAMPHET ER 15 MG PO CP24
15.0000 mg | ORAL_CAPSULE | ORAL | 0 refills | Status: DC
  Filled 2024-06-29: qty 30, 30d supply, fill #0

## 2024-07-02 ENCOUNTER — Other Ambulatory Visit (HOSPITAL_COMMUNITY): Payer: Self-pay

## 2024-07-02 ENCOUNTER — Other Ambulatory Visit: Payer: Self-pay

## 2024-07-02 DIAGNOSIS — F902 Attention-deficit hyperactivity disorder, combined type: Secondary | ICD-10-CM

## 2024-07-02 MED ORDER — AMPHETAMINE-DEXTROAMPHET ER 15 MG PO CP24: 15.0000 mg | ORAL_CAPSULE | ORAL | 0 refills | Status: AC

## 2024-07-02 MED ORDER — AMPHETAMINE-DEXTROAMPHET ER 15 MG PO CP24
15.0000 mg | ORAL_CAPSULE | ORAL | 0 refills | Status: AC
  Filled 2024-07-02 – 2024-07-29 (×2): qty 30, 30d supply, fill #0

## 2024-07-26 ENCOUNTER — Telehealth: Admitting: Physician Assistant

## 2024-07-26 DIAGNOSIS — H6991 Unspecified Eustachian tube disorder, right ear: Secondary | ICD-10-CM | POA: Diagnosis not present

## 2024-07-26 DIAGNOSIS — J019 Acute sinusitis, unspecified: Secondary | ICD-10-CM | POA: Diagnosis not present

## 2024-07-26 DIAGNOSIS — B9689 Other specified bacterial agents as the cause of diseases classified elsewhere: Secondary | ICD-10-CM

## 2024-07-26 MED ORDER — AMOXICILLIN-POT CLAVULANATE 875-125 MG PO TABS: 1.0000 | ORAL_TABLET | Freq: Two times a day (BID) | ORAL | 0 refills | Status: AC

## 2024-07-26 MED ORDER — IPRATROPIUM BROMIDE 0.03 % NA SOLN: 2.0000 | Freq: Two times a day (BID) | NASAL | 0 refills | Status: AC

## 2024-07-26 NOTE — Progress Notes (Signed)

## 2024-07-30 ENCOUNTER — Other Ambulatory Visit (HOSPITAL_COMMUNITY): Payer: Self-pay

## 2024-07-30 ENCOUNTER — Other Ambulatory Visit: Payer: Self-pay

## 2024-11-20 ENCOUNTER — Ambulatory Visit: Admitting: Family Medicine
